# Patient Record
Sex: Male | Born: 1965 | Race: White | Hispanic: No | Marital: Single | Smoking: Current every day smoker
Health system: Southern US, Community
[De-identification: ages and names within clinical notes are randomized; demographics above are authoritative.]

## PROBLEM LIST (undated history)

## (undated) DIAGNOSIS — I714 Abdominal aortic aneurysm, without rupture, unspecified: Secondary | ICD-10-CM

## (undated) DIAGNOSIS — I499 Cardiac arrhythmia, unspecified: Secondary | ICD-10-CM

## (undated) DIAGNOSIS — I447 Left bundle-branch block, unspecified: Secondary | ICD-10-CM

## (undated) DIAGNOSIS — K859 Acute pancreatitis without necrosis or infection, unspecified: Secondary | ICD-10-CM

## (undated) DIAGNOSIS — K219 Gastro-esophageal reflux disease without esophagitis: Secondary | ICD-10-CM

## (undated) DIAGNOSIS — I10 Essential (primary) hypertension: Secondary | ICD-10-CM

## (undated) DIAGNOSIS — J45909 Unspecified asthma, uncomplicated: Secondary | ICD-10-CM

## (undated) HISTORY — PX: HAND SURGERY: SHX662

## (undated) HISTORY — DX: Abdominal aortic aneurysm, without rupture: I71.4

## (undated) HISTORY — DX: Abdominal aortic aneurysm, without rupture, unspecified: I71.40

---

## 2018-06-08 ENCOUNTER — Inpatient Hospital Stay (HOSPITAL_COMMUNITY)
Admission: EM | Admit: 2018-06-08 | Discharge: 2018-06-13 | DRG: 439 | Disposition: A | Discharge status: Home / Self Care | Payer: Self-pay | Attending: Internal Medicine | Admitting: Internal Medicine

## 2018-06-08 ENCOUNTER — Other Ambulatory Visit: Payer: Self-pay

## 2018-06-08 ENCOUNTER — Emergency Department (HOSPITAL_COMMUNITY): Payer: Self-pay

## 2018-06-08 ENCOUNTER — Encounter (HOSPITAL_COMMUNITY): Payer: Self-pay

## 2018-06-08 DIAGNOSIS — Z7289 Other problems related to lifestyle: Secondary | ICD-10-CM

## 2018-06-08 DIAGNOSIS — K859 Acute pancreatitis without necrosis or infection, unspecified: Principal | ICD-10-CM | POA: Diagnosis present

## 2018-06-08 DIAGNOSIS — K219 Gastro-esophageal reflux disease without esophagitis: Secondary | ICD-10-CM | POA: Diagnosis present

## 2018-06-08 DIAGNOSIS — K2980 Duodenitis without bleeding: Secondary | ICD-10-CM | POA: Diagnosis present

## 2018-06-08 DIAGNOSIS — D72829 Elevated white blood cell count, unspecified: Secondary | ICD-10-CM

## 2018-06-08 DIAGNOSIS — Z9114 Patient's other noncompliance with medication regimen: Secondary | ICD-10-CM

## 2018-06-08 DIAGNOSIS — R739 Hyperglycemia, unspecified: Secondary | ICD-10-CM | POA: Diagnosis present

## 2018-06-08 DIAGNOSIS — K861 Other chronic pancreatitis: Secondary | ICD-10-CM | POA: Diagnosis present

## 2018-06-08 DIAGNOSIS — E162 Hypoglycemia, unspecified: Secondary | ICD-10-CM | POA: Diagnosis not present

## 2018-06-08 DIAGNOSIS — F1090 Alcohol use, unspecified, uncomplicated: Secondary | ICD-10-CM

## 2018-06-08 DIAGNOSIS — I714 Abdominal aortic aneurysm, without rupture, unspecified: Secondary | ICD-10-CM | POA: Diagnosis present

## 2018-06-08 DIAGNOSIS — E860 Dehydration: Secondary | ICD-10-CM | POA: Diagnosis present

## 2018-06-08 DIAGNOSIS — K863 Pseudocyst of pancreas: Secondary | ICD-10-CM | POA: Diagnosis present

## 2018-06-08 DIAGNOSIS — Z789 Other specified health status: Secondary | ICD-10-CM

## 2018-06-08 DIAGNOSIS — F1010 Alcohol abuse, uncomplicated: Secondary | ICD-10-CM | POA: Diagnosis present

## 2018-06-08 DIAGNOSIS — I10 Essential (primary) hypertension: Secondary | ICD-10-CM | POA: Diagnosis present

## 2018-06-08 DIAGNOSIS — E876 Hypokalemia: Secondary | ICD-10-CM | POA: Diagnosis present

## 2018-06-08 HISTORY — DX: Acute pancreatitis without necrosis or infection, unspecified: K85.90

## 2018-06-08 HISTORY — DX: Essential (primary) hypertension: I10

## 2018-06-08 LAB — C-REACTIVE PROTEIN: CRP: 6 mg/dL — ABNORMAL HIGH (ref <1.0)

## 2018-06-08 LAB — LIPASE, BLOOD: Lipase: 120 U/L — ABNORMAL HIGH (ref 11–51)

## 2018-06-08 LAB — COMPREHENSIVE METABOLIC PANEL
ALT: 18 U/L (ref 0–44)
AST: 29 U/L (ref 15–41)
Albumin: 4.8 g/dL (ref 3.5–5.0)
Alkaline Phosphatase: 108 U/L (ref 38–126)
Anion gap: 22 — ABNORMAL HIGH (ref 5–15)
BUN: 22 mg/dL — ABNORMAL HIGH (ref 6–20)
CO2: 38 mmol/L — ABNORMAL HIGH (ref 22–32)
Calcium: 10.5 mg/dL — ABNORMAL HIGH (ref 8.9–10.3)
Chloride: 82 mmol/L — ABNORMAL LOW (ref 98–111)
Creatinine, Ser: 1.18 mg/dL (ref 0.61–1.24)
GFR calc Af Amer: >60 mL/min (ref >60)
GFR calc non Af Amer: >60 mL/min (ref >60)
Glucose, Bld: 154 mg/dL — ABNORMAL HIGH (ref 70–99)
Potassium: 3 mmol/L — ABNORMAL LOW (ref 3.5–5.1)
Sodium: 142 mmol/L (ref 135–145)
Total Bilirubin: 0.8 mg/dL (ref 0.3–1.2)
Total Protein: 9 g/dL — ABNORMAL HIGH (ref 6.5–8.1)

## 2018-06-08 LAB — CBC
HCT: 54.8 % — ABNORMAL HIGH (ref 39.0–52.0)
Hemoglobin: 17.8 g/dL — ABNORMAL HIGH (ref 13.0–17.0)
MCH: 31.7 pg (ref 26.0–34.0)
MCHC: 32.5 g/dL (ref 30.0–36.0)
MCV: 97.5 fL (ref 80.0–100.0)
Platelets: 325 10*3/uL (ref 150–400)
RBC: 5.62 MIL/uL (ref 4.22–5.81)
RDW: 12.8 % (ref 11.5–15.5)
WBC: 23.4 10*3/uL — ABNORMAL HIGH (ref 4.0–10.5)
nRBC: 0 % (ref 0.0–0.2)

## 2018-06-08 LAB — MAGNESIUM: Magnesium: 2.2 mg/dL (ref 1.7–2.4)

## 2018-06-08 MED ORDER — LORAZEPAM 2 MG/ML IJ SOLN: 1.0000 mg | Freq: Four times a day (QID) | INTRAMUSCULAR | Status: AC | PRN

## 2018-06-08 MED ORDER — PANTOPRAZOLE SODIUM 40 MG IV SOLR
40.0000 mg | INTRAVENOUS | Status: DC
  Administered 2018-06-09 – 2018-06-10 (×2): 40 mg via INTRAVENOUS
  Filled 2018-06-08 (×2): qty 40

## 2018-06-08 MED ORDER — ACETAMINOPHEN 650 MG RE SUPP: 650.0000 mg | Freq: Four times a day (QID) | RECTAL | Status: DC | PRN

## 2018-06-08 MED ORDER — LORAZEPAM 1 MG PO TABS
1.0000 mg | ORAL_TABLET | Freq: Four times a day (QID) | ORAL | Status: AC | PRN
  Administered 2018-06-09: 1 mg via ORAL
  Filled 2018-06-08: qty 1

## 2018-06-08 MED ORDER — MORPHINE SULFATE (PF) 4 MG/ML IV SOLN
4.0000 mg | Freq: Once | INTRAVENOUS | Status: AC
  Administered 2018-06-08: 4 mg via INTRAVENOUS
  Filled 2018-06-08: qty 1

## 2018-06-08 MED ORDER — SODIUM CHLORIDE (PF) 0.9 % IJ SOLN
INTRAMUSCULAR | Status: AC
  Filled 2018-06-08: qty 50

## 2018-06-08 MED ORDER — IOPAMIDOL (ISOVUE-300) INJECTION 61%
100.0000 mL | Freq: Once | INTRAVENOUS | Status: AC | PRN
  Administered 2018-06-08: 100 mL via INTRAVENOUS

## 2018-06-08 MED ORDER — SODIUM CHLORIDE 0.9 % IV BOLUS
1000.0000 mL | Freq: Once | INTRAVENOUS | Status: AC
  Administered 2018-06-08: 1000 mL via INTRAVENOUS

## 2018-06-08 MED ORDER — IOPAMIDOL (ISOVUE-300) INJECTION 61%
INTRAVENOUS | Status: AC
  Filled 2018-06-08: qty 100

## 2018-06-08 MED ORDER — FOLIC ACID 1 MG PO TABS
1.0000 mg | ORAL_TABLET | Freq: Every day | ORAL | Status: DC
  Administered 2018-06-10 – 2018-06-13 (×4): 1 mg via ORAL
  Filled 2018-06-08 (×4): qty 1

## 2018-06-08 MED ORDER — ONDANSETRON HCL 4 MG/2ML IJ SOLN
4.0000 mg | Freq: Four times a day (QID) | INTRAMUSCULAR | Status: DC | PRN
  Administered 2018-06-11 (×2): 4 mg via INTRAVENOUS
  Filled 2018-06-08 (×2): qty 2

## 2018-06-08 MED ORDER — LACTATED RINGERS IV SOLN
INTRAVENOUS | Status: DC
  Administered 2018-06-08 – 2018-06-09 (×2): via INTRAVENOUS

## 2018-06-08 MED ORDER — THIAMINE HCL 100 MG/ML IJ SOLN
100.0000 mg | Freq: Every day | INTRAMUSCULAR | Status: DC
  Administered 2018-06-09: 100 mg via INTRAVENOUS
  Filled 2018-06-08: qty 2

## 2018-06-08 MED ORDER — PANTOPRAZOLE SODIUM 40 MG IV SOLR
40.0000 mg | Freq: Once | INTRAVENOUS | Status: AC
  Administered 2018-06-08: 40 mg via INTRAVENOUS
  Filled 2018-06-08: qty 40

## 2018-06-08 MED ORDER — MORPHINE SULFATE (PF) 2 MG/ML IV SOLN
2.0000 mg | INTRAVENOUS | Status: DC | PRN
  Administered 2018-06-09 – 2018-06-12 (×17): 2 mg via INTRAVENOUS
  Filled 2018-06-08 (×18): qty 1

## 2018-06-08 MED ORDER — POTASSIUM CHLORIDE 10 MEQ/100ML IV SOLN
10.0000 meq | INTRAVENOUS | Status: AC
  Administered 2018-06-08 (×3): 10 meq via INTRAVENOUS
  Filled 2018-06-08 (×2): qty 100

## 2018-06-08 MED ORDER — ONDANSETRON HCL 4 MG/2ML IJ SOLN
4.0000 mg | Freq: Once | INTRAMUSCULAR | Status: AC
  Administered 2018-06-08: 4 mg via INTRAVENOUS
  Filled 2018-06-08: qty 2

## 2018-06-08 MED ORDER — HYDROMORPHONE HCL 1 MG/ML IJ SOLN
1.0000 mg | Freq: Once | INTRAMUSCULAR | Status: AC
  Administered 2018-06-08: 1 mg via INTRAVENOUS
  Filled 2018-06-08: qty 1

## 2018-06-08 MED ORDER — ACETAMINOPHEN 325 MG PO TABS: 650.0000 mg | ORAL_TABLET | Freq: Four times a day (QID) | ORAL | Status: DC | PRN

## 2018-06-08 MED ORDER — HYDRALAZINE HCL 20 MG/ML IJ SOLN: 5.0000 mg | INTRAMUSCULAR | Status: DC | PRN

## 2018-06-08 MED ORDER — VITAMIN B-1 100 MG PO TABS
100.0000 mg | ORAL_TABLET | Freq: Every day | ORAL | Status: DC
  Administered 2018-06-10 – 2018-06-13 (×4): 100 mg via ORAL
  Filled 2018-06-08 (×4): qty 1

## 2018-06-08 MED ORDER — ADULT MULTIVITAMIN W/MINERALS CH
1.0000 | ORAL_TABLET | Freq: Every day | ORAL | Status: DC
  Administered 2018-06-10 – 2018-06-13 (×4): 1 via ORAL
  Filled 2018-06-08 (×4): qty 1

## 2018-06-08 MED ORDER — ONDANSETRON HCL 4 MG PO TABS
4.0000 mg | ORAL_TABLET | Freq: Four times a day (QID) | ORAL | Status: DC | PRN
  Administered 2018-06-10: 4 mg via ORAL
  Filled 2018-06-08: qty 1
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
## 2018-06-08 NOTE — H&P (Signed)
History and Physical    Justin Poole Justin Poole DOB: 1965-12-12 DOA: 06/08/2018  PCP: Justin Poole, No Pcp Per  Justin Poole coming from: Justin Poole is homeless.  Chief Complaint: Abdominal pain.  HPI: Justin Poole is a 53 y.o. male with history of alcoholism tobacco abuse hypertension GERD presents to the ER with complaints of abdominal pain.  Justin Poole states Justin Poole has been abdominal pain for last 2 days and epigastrium with no radiation or any vomiting or diarrhea.  Pain has been persistent.  Justin Poole states he was admitted at [facility] 3 months ago and at the time was first time diagnosed with pancreatitis.  Since then he has been trying to cut down his alcohol and at this time drinks only 1 or 2 drinks a week.  Last drink was 3 days ago following which his pancreatitis started acting up again.  Denies chest pain shortness of breath.  Denies any hematemesis.  Has not taken his medications for many weeks now.  ED Course: In the ER Justin Poole's labs show hematocrit of 54 hemoglobin of 17 lipase of 120 with hypercalcemia and mild hypokalemia.  CT scan of the abdomen pelvis done shows features concerning for pancreatitis with chronic pseudocyst and there is also concern for duodenitis.  In addition shown his infrarenal abdominal aortic aneurysm slightly enlarged from previous.  Justin Poole admitted for acute pancreatitis IV fluids started along with pain medication.  Review of Systems: As per HPI, rest all negative.   Past Medical History:  Diagnosis Date  . Pancreatitis   . Pancreatitis     History reviewed. No pertinent surgical history.   reports that he has never smoked. He has never used smokeless tobacco. He reports current alcohol use. No history on file for drug.  No Known Allergies  History reviewed. No pertinent family history.  Prior to Admission medications   Not on File    Physical Exam: Vitals:   06/08/18 1809 06/08/18 1830 06/08/18 1900 06/08/18 2000  BP:  (!) 144/90  139/89 (!) 143/81  Pulse:  (!) 109 (!) 109 93  Resp:   18 16  SpO2: 97% 94% 95% 94%      Constitutional: Moderately built and nourished. Vitals:   06/08/18 1809 06/08/18 1830 06/08/18 1900 06/08/18 2000  BP:  (!) 144/90 139/89 (!) 143/81  Pulse:  (!) 109 (!) 109 93  Resp:   18 16  SpO2: 97% 94% 95% 94%   Eyes: Anicteric no pallor. ENMT: No discharge from the ears eyes nose or mouth. Neck: No mass felt.  No neck rigidity but no JVD appreciated. Respiratory: No rhonchi or crepitations. Cardiovascular: S1-S2 heard no murmurs appreciated. Abdomen: Soft mild epigastric tenderness no guarding or rigidity. Musculoskeletal: No edema.  No joint effusion. Skin: No rash. Neurologic: Alert awake oriented to time place and person.  Moves all extremities. Psychiatric: Appears normal.  Normal affect.   Labs on Admission: I have personally reviewed following labs and imaging studies  CBC: Recent Labs  Lab 06/08/18 1904  WBC 23.4*  HGB 17.8*  HCT 54.8*  MCV 97.5  PLT 325   Basic Metabolic Panel: Recent Labs  Lab 06/08/18 1904  NA 142  K 3.0*  CL 82*  CO2 38*  GLUCOSE 154*  BUN 22*  CREATININE 1.18  CALCIUM 10.5*   GFR: CrCl cannot be calculated (Unknown ideal weight.). Liver Function Tests: Recent Labs  Lab 06/08/18 1904  AST 29  ALT 18  ALKPHOS 108  BILITOT 0.8  PROT 9.0*  ALBUMIN 4.8   Recent Labs  Lab 06/08/18 1904  LIPASE 120*   No results for input(s): AMMONIA in the last 168 hours. Coagulation Profile: No results for input(s): INR, PROTIME in the last 168 hours. Cardiac Enzymes: No results for input(s): CKTOTAL, CKMB, CKMBINDEX, TROPONINI in the last 168 hours. BNP (last 3 results) No results for input(s): PROBNP in the last 8760 hours. HbA1C: No results for input(s): HGBA1C in the last 72 hours. CBG: No results for input(s): GLUCAP in the last 168 hours. Lipid Profile: No results for input(s): CHOL, HDL, LDLCALC, TRIG, CHOLHDL, LDLDIRECT in the  last 72 hours. Thyroid Function Tests: No results for input(s): TSH, T4TOTAL, FREET4, T3FREE, THYROIDAB in the last 72 hours. Anemia Panel: No results for input(s): VITAMINB12, FOLATE, FERRITIN, TIBC, IRON, RETICCTPCT in the last 72 hours. Urine analysis: No results found for: COLORURINE, APPEARANCEUR, LABSPEC, PHURINE, GLUCOSEU, HGBUR, BILIRUBINUR, KETONESUR, PROTEINUR, UROBILINOGEN, NITRITE, LEUKOCYTESUR Sepsis Labs: @LABRCNTIP (procalcitonin:4,lacticidven:4) )No results found for this or any previous visit (from the past 240 hour(s)).   Radiological Exams on Admission: Ct Abdomen Pelvis W Contrast  Result Date: 06/08/2018 CLINICAL DATA:  Abdominal pain with nausea and vomiting. EXAM: CT ABDOMEN AND PELVIS WITH CONTRAST TECHNIQUE: Multidetector CT imaging of the abdomen and pelvis was performed using the standard protocol following bolus administration of intravenous contrast. CONTRAST:  <See Chart> ISOVUE-300 IOPAMIDOL (ISOVUE-300) INJECTION 61% COMPARISON:  02/16/2018 FINDINGS: Lower chest: Lung bases essentially clear.  Heart normal in size. Hepatobiliary: Decreased attenuation of the liver consistent with fatty infiltration. Liver normal in size. No mass or focal lesion. Normal gallbladder. No bile duct dilation. Pancreas: Cystic mass in the pancreatic head measuring 2.4 x 1.6 x 2.7 cm. This is similar to the prior CT. No other pancreatic masses. Are inflammatory changes at centered adjacent to the first and second portions of the duodenum and in the right upper quadrant adjacent to the a pathic flexure of the colon. These contact the pancreatic head. No other peripancreatic inflammation. Spleen: Normal in size without focal abnormality. Adrenals/Urinary Tract: No adrenal masses. Kidneys normal in size, orientation and position. Tiny low-density lesion in the medial upper pole of the right kidney. Subcentimeter low-density lesion in the medial lower pole of the left kidney. These are likely cysts.  No other renal masses or lesions, no stones and no hydronephrosis. Ureters are normal in course and in caliber. Bladder is unremarkable. Stomach/Bowel: There is wall thickening of the distal esophagus measuring 6-7 mm. Stomach is unremarkable. There is irregular wall thickening of the duodenal bulb and second portion of the duodenum, predominantly the second portion of the duodenum mostly medially where it abuts the pancreatic head. There is surrounding inflammation. Small bowel is normal in caliber. No wall thickening or inflammation. The hepatic flexure of the colon is decompressed with adjacent inflammation. There is fat within the wall. Remainder of the colon is unremarkable. Normal appendix visualized. Vascular/Lymphatic: Abdominal aortic aneurysm. This is a few so formed infrarenal aneurysm, 7 cm in length, 4.8 cm anterior-posterior and 5 cm right to left. This has increased from 4.5 cm anterior-posterior on the prior study. Portal vein, superior mesenteric vein and splenic vein are widely patent. Prominent subcentimeter gastrohepatic ligament lymph nodes. No enlarged lymph nodes. Reproductive: Unremarkable. Other: A small amount of fluid tracks along the right anterior pararenal fascia. Musculoskeletal: No fracture or acute finding. No osteoblastic or osteolytic lesions. IMPRESSION: 1. There are inflammatory changes in the right upper quadrant. This may be from focal pancreatitis of pancreatic head,  which is supported by a cystic mass in pancreatic head, the latter finding stable from the prior CT, likely a chronic pseudocyst. However, the inflammation is more centered on the first and second portions of the duodenum, and also abuts the hepatic flexure of the colon. There is irregular wall thickening of the first and second portions of the duodenum. This may be reactive to pancreatitis or reflect duodenitis. No evidence of a perforated ulcer. 2. Wall thickening of the distal esophagus suggesting esophagitis.  3. No other acute findings in the abdomen or pelvis. 4. 4.8 cm infrarenal abdominal aortic aneurysm increased in size from 4.5 cm previously. Recommend followup by abdomen and pelvis CTA in 6 months, and vascular surgery referral/consultation if not already obtained. This recommendation follows ACR consensus guidelines: White Paper of the ACR Incidental Findings Committee II on Vascular Findings. J Am Coll Radiol 2013; 10:789-794. Aortic aneurysm NOS (ICD10-I71.9) 5. Hepatic steatosis. Electronically Signed   By: Amie Portland M.D.   On: 06/08/2018 20:31     Assessment/Plan Principal Problem:   Acute pancreatitis Active Problems:   Hypercalcemia   Hypokalemia    1. 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  Justin Poole also has significantly elevated hematocrit for which we will start with aggressive hydration keep Justin Poole n.p.o. follow intake output metabolic panel follow BUN/creatinine and hemoglobin hematocrit.  Justin Poole advised quitting alcohol.  There is also concern for duodenitis for which Justin Poole is on Protonix for now. 2. Significant leukocytosis -has no fever.  Does have pseudocyst but as per the CAT scan finding it has been stable since the last finding.  Justin Poole is afebrile.  Will check UA and chest x-ray.  Follow blood cultures.  No antibiotics at this time.  No definite signs of any necrotizing pancreatitis in the CAT scan. 3. Hypercalcemia likely from dehydration.  Continue hydration and I think calcium may get corrected.  Follow metabolic panel. 4. Hypokalemia likely from dehydration.  Replace recheck.  Check magnesium. 5. Hypertension has not been taking his medications for last few weeks.  Place Justin Poole on PRN IV hydralazine. 6. Infrarenal abdominal aortic aneurysm has increased in size from previous.  Will need repeat CAT scan in 6 months. 7. Mild hyperglycemia will  check hemoglobin A1c. 8. Alcohol and tobacco abuse -Place Justin Poole on CIWA.  Tobacco cessation counseling requested.   DVT prophylaxis: SCDs. Code Status: Full code. Family Communication: Discussed with Justin Poole. Disposition Plan: To be determined. Consults called: None. Admission status: Inpatient.   Eduard Clos MD [facility] Pager [phone].  If 7PM-7AM, please contact night-coverage [email or web address] Password [email or web address]  06/08/2018, 9:21 PM

## 2018-06-08 NOTE — ED Provider Notes (Signed)
[facility]-EMERGENCY DEPT Provider Note   CSN: [id number] Arrival date & time: 06/08/18  1802     History   Chief Complaint Chief Complaint  Patient presents with  . Abdominal Pain    HPI Justin Poole is a 53 y.o. male.  HPI Patient is a 53 year old male presents the emergency department with 2 days of worsening upper abdominal pain with associated nausea and vomiting.  Still occasionally drinks alcohol.  History of pancreatitis.  Reports severe upper abdominal pain.  Denies hematemesis.  No diarrhea.  No fevers or chills.  No chest pain.  Symptoms are moderate to severe in severity.  Feels weak and dehydrated.   Past Medical History:  Diagnosis Date  . Pancreatitis     There are no active problems to display for this patient.   History reviewed. No pertinent surgical history.      Home Medications    Prior to Admission medications   Not on File    Family History History reviewed. No pertinent family history.  Social History Social History   Tobacco Use  . Smoking status: Not on file  Substance Use Topics  . Alcohol use: Yes  . Drug use: Not on file     Allergies   Patient has no known allergies.   Review of Systems Review of Systems  All other systems reviewed and are negative.    Physical Exam Updated Vital Signs BP (!) 143/81   Pulse 93   Resp 16   SpO2 94%   Physical Exam Vitals signs and nursing note reviewed.  Constitutional:      Appearance: He is well-developed.  HENT:     Head: Normocephalic and atraumatic.  Neck:     Musculoskeletal: Normal range of motion.  Cardiovascular:     Rate and Rhythm: Normal rate and regular rhythm.     Heart sounds: Normal heart sounds.  Pulmonary:     Effort: Pulmonary effort is normal. No respiratory distress.     Breath sounds: Normal breath sounds.  Abdominal:     General: There is no distension.     Palpations: Abdomen is soft.     Tenderness: There is abdominal  tenderness in the epigastric area. There is no guarding or rebound.  Musculoskeletal: Normal range of motion.  Skin:    General: Skin is warm and dry.  Neurological:     Mental Status: He is alert and oriented to person, place, and time.  Psychiatric:        Judgment: Judgment normal.      ED Treatments / Results  Labs (all labs ordered are listed, but only abnormal results are displayed) Labs Reviewed  CBC - Abnormal; Notable for the following components:      Result Value   WBC 23.4 (*)    Hemoglobin 17.8 (*)    HCT 54.8 (*)    All other components within normal limits  COMPREHENSIVE METABOLIC PANEL - Abnormal; Notable for the following components:   Potassium 3.0 (*)    Chloride 82 (*)    CO2 38 (*)    Glucose, Bld 154 (*)    BUN 22 (*)    Calcium 10.5 (*)    Total Protein 9.0 (*)    Anion gap 22 (*)    All other components within normal limits  LIPASE, BLOOD - Abnormal; Notable for the following components:   Lipase 120 (*)    All other components within normal limits    EKG  None  Radiology Ct Abdomen Pelvis W Contrast  Result Date: 06/08/2018 CLINICAL DATA:  Abdominal pain with nausea and vomiting. EXAM: CT ABDOMEN AND PELVIS WITH CONTRAST TECHNIQUE: Multidetector CT imaging of the abdomen and pelvis was performed using the standard protocol following bolus administration of intravenous contrast. CONTRAST:  <See Chart> ISOVUE-300 IOPAMIDOL (ISOVUE-300) INJECTION 61% COMPARISON:  02/16/2018 FINDINGS: Lower chest: Lung bases essentially clear.  Heart normal in size. Hepatobiliary: Decreased attenuation of the liver consistent with fatty infiltration. Liver normal in size. No mass or focal lesion. Normal gallbladder. No bile duct dilation. Pancreas: Cystic mass in the pancreatic head measuring 2.4 x 1.6 x 2.7 cm. This is similar to the prior CT. No other pancreatic masses. Are inflammatory changes at centered adjacent to the first and second portions of the duodenum and  in the right upper quadrant adjacent to the a pathic flexure of the colon. These contact the pancreatic head. No other peripancreatic inflammation. Spleen: Normal in size without focal abnormality. Adrenals/Urinary Tract: No adrenal masses. Kidneys normal in size, orientation and position. Tiny low-density lesion in the medial upper pole of the right kidney. Subcentimeter low-density lesion in the medial lower pole of the left kidney. These are likely cysts. No other renal masses or lesions, no stones and no hydronephrosis. Ureters are normal in course and in caliber. Bladder is unremarkable. Stomach/Bowel: There is wall thickening of the distal esophagus measuring 6-7 mm. Stomach is unremarkable. There is irregular wall thickening of the duodenal bulb and second portion of the duodenum, predominantly the second portion of the duodenum mostly medially where it abuts the pancreatic head. There is surrounding inflammation. Small bowel is normal in caliber. No wall thickening or inflammation. The hepatic flexure of the colon is decompressed with adjacent inflammation. There is fat within the wall. Remainder of the colon is unremarkable. Normal appendix visualized. Vascular/Lymphatic: Abdominal aortic aneurysm. This is a few so formed infrarenal aneurysm, 7 cm in length, 4.8 cm anterior-posterior and 5 cm right to left. This has increased from 4.5 cm anterior-posterior on the prior study. Portal vein, superior mesenteric vein and splenic vein are widely patent. Prominent subcentimeter gastrohepatic ligament lymph nodes. No enlarged lymph nodes. Reproductive: Unremarkable. Other: A small amount of fluid tracks along the right anterior pararenal fascia. Musculoskeletal: No fracture or acute finding. No osteoblastic or osteolytic lesions. IMPRESSION: 1. There are inflammatory changes in the right upper quadrant. This may be from focal pancreatitis of pancreatic head, which is supported by a cystic mass in pancreatic head,  the latter finding stable from the prior CT, likely a chronic pseudocyst. However, the inflammation is more centered on the first and second portions of the duodenum, and also abuts the hepatic flexure of the colon. There is irregular wall thickening of the first and second portions of the duodenum. This may be reactive to pancreatitis or reflect duodenitis. No evidence of a perforated ulcer. 2. Wall thickening of the distal esophagus suggesting esophagitis. 3. No other acute findings in the abdomen or pelvis. 4. 4.8 cm infrarenal abdominal aortic aneurysm increased in size from 4.5 cm previously. Recommend followup by abdomen and pelvis CTA in 6 months, and vascular surgery referral/consultation if not already obtained. This recommendation follows ACR consensus guidelines: White Paper of the ACR Incidental Findings Committee II on Vascular Findings. J Am Coll Radiol 2013; 10:789-794. Aortic aneurysm NOS (ICD10-I71.9) 5. Hepatic steatosis. Electronically Signed   By: Amie Portland M.D.   On: 06/08/2018 20:31    Procedures Procedures (including critical  care time)  Medications Ordered in ED Medications  sodium chloride (PF) 0.9 % injection (has no administration in time range)  sodium chloride 0.9 % bolus 1,000 mL (has no administration in time range)  potassium chloride 10 mEq in 100 mL IVPB (has no administration in time range)  ondansetron (ZOFRAN) injection 4 mg (4 mg Intravenous Given 06/08/18 1855)  HYDROmorphone (DILAUDID) injection 1 mg (1 mg Intravenous Given 06/08/18 1856)  pantoprazole (PROTONIX) injection 40 mg (40 mg Intravenous Given 06/08/18 1858)  sodium chloride 0.9 % bolus 1,000 mL (0 mLs Intravenous Stopped 06/08/18 1958)  iopamidol (ISOVUE-300) 61 % injection 100 mL (100 mLs Intravenous Contrast Given 06/08/18 2008)     Initial Impression / Assessment and Plan / ED Course  I have reviewed the triage vital signs and the nursing notes.  Pertinent labs & imaging results that were  available during my care of the patient were reviewed by me and considered in my medical decision making (see chart for details).     Pancreatitis/duodenitis.  IV Protonix given.  IV fluids given.  Will initiate IV potassium at this time.  Will admit for symptom and pain control as well as bowel rest.  Elevated white blood cell count noted.  Could have secondarily infected pseudocyst.  Will likely benefit from GI consultation in the a.m.  No additional emergent consultation required tonight.  Patient will be admitted to the hospital for symptom control.  Final Clinical Impressions(s) / ED Diagnoses   Final diagnoses:  Acute pancreatitis, unspecified complication status, unspecified pancreatitis type    ED Discharge Orders    None       Azalia Bilis, MD 06/08/18 2102

## 2018-06-08 NOTE — ED Notes (Signed)
Patient transported to CT 

## 2018-06-09 ENCOUNTER — Inpatient Hospital Stay (HOSPITAL_COMMUNITY): Payer: Self-pay

## 2018-06-09 DIAGNOSIS — I714 Abdominal aortic aneurysm, without rupture, unspecified: Secondary | ICD-10-CM | POA: Diagnosis present

## 2018-06-09 DIAGNOSIS — Z789 Other specified health status: Secondary | ICD-10-CM

## 2018-06-09 DIAGNOSIS — K219 Gastro-esophageal reflux disease without esophagitis: Secondary | ICD-10-CM | POA: Diagnosis present

## 2018-06-09 DIAGNOSIS — I10 Essential (primary) hypertension: Secondary | ICD-10-CM | POA: Diagnosis present

## 2018-06-09 DIAGNOSIS — Z7289 Other problems related to lifestyle: Secondary | ICD-10-CM

## 2018-06-09 LAB — CBC WITH DIFFERENTIAL/PLATELET
Abs Immature Granulocytes: 0.09 10*3/uL — ABNORMAL HIGH (ref 0.00–0.07)
Basophils Absolute: 0 10*3/uL (ref 0.0–0.1)
Basophils Relative: 0 %
Eosinophils Absolute: 0 10*3/uL (ref 0.0–0.5)
Eosinophils Relative: 0 %
HCT: 41 % (ref 39.0–52.0)
Hemoglobin: 12.8 g/dL — ABNORMAL LOW (ref 13.0–17.0)
Immature Granulocytes: 1 %
Lymphocytes Relative: 11 %
Lymphs Abs: 2.1 10*3/uL (ref 0.7–4.0)
MCH: 30.6 pg (ref 26.0–34.0)
MCHC: 31.2 g/dL (ref 30.0–36.0)
MCV: 98.1 fL (ref 80.0–100.0)
Monocytes Absolute: 1.5 10*3/uL — ABNORMAL HIGH (ref 0.1–1.0)
Monocytes Relative: 8 %
Neutro Abs: 15.4 10*3/uL — ABNORMAL HIGH (ref 1.7–7.7)
Neutrophils Relative %: 80 %
Platelets: 218 10*3/uL (ref 150–400)
RBC: 4.18 MIL/uL — ABNORMAL LOW (ref 4.22–5.81)
RDW: 13.1 % (ref 11.5–15.5)
WBC: 19.1 10*3/uL — ABNORMAL HIGH (ref 4.0–10.5)
nRBC: 0 % (ref 0.0–0.2)

## 2018-06-09 LAB — HEMOGLOBIN A1C
Hgb A1c MFr Bld: 5.6 % (ref 4.8–5.6)
Mean Plasma Glucose: 114.02 mg/dL

## 2018-06-09 LAB — HEPATIC FUNCTION PANEL
ALT: 11 U/L (ref 0–44)
AST: 14 U/L — ABNORMAL LOW (ref 15–41)
Albumin: 3 g/dL — ABNORMAL LOW (ref 3.5–5.0)
Alkaline Phosphatase: 65 U/L (ref 38–126)
Bilirubin, Direct: 0.1 mg/dL (ref 0.0–0.2)
Indirect Bilirubin: 0.7 mg/dL (ref 0.3–0.9)
Total Bilirubin: 0.8 mg/dL (ref 0.3–1.2)
Total Protein: 5.5 g/dL — ABNORMAL LOW (ref 6.5–8.1)

## 2018-06-09 LAB — BASIC METABOLIC PANEL
Anion gap: 8 (ref 5–15)
BUN: 14 mg/dL (ref 6–20)
CO2: 34 mmol/L — ABNORMAL HIGH (ref 22–32)
Calcium: 8.5 mg/dL — ABNORMAL LOW (ref 8.9–10.3)
Chloride: 95 mmol/L — ABNORMAL LOW (ref 98–111)
Creatinine, Ser: 0.72 mg/dL (ref 0.61–1.24)
GFR calc Af Amer: >60 mL/min (ref >60)
GFR calc non Af Amer: >60 mL/min (ref >60)
Glucose, Bld: 88 mg/dL (ref 70–99)
Potassium: 3.1 mmol/L — ABNORMAL LOW (ref 3.5–5.1)
Sodium: 137 mmol/L (ref 135–145)

## 2018-06-09 LAB — HIV ANTIBODY (ROUTINE TESTING W REFLEX): HIV Screen 4th Generation wRfx: NONREACTIVE

## 2018-06-09 MED ORDER — POTASSIUM CHLORIDE 10 MEQ/100ML IV SOLN
10.0000 meq | INTRAVENOUS | Status: AC
  Administered 2018-06-09 (×4): 10 meq via INTRAVENOUS
  Filled 2018-06-09 (×3): qty 100

## 2018-06-09 MED ORDER — SODIUM CHLORIDE 0.9 % IV SOLN
INTRAVENOUS | Status: DC
  Administered 2018-06-09 (×2): via INTRAVENOUS

## 2018-06-09 NOTE — ED Notes (Signed)
ED TO INPATIENT HANDOFF REPORT  Name/Age/Gender Justin Poole 53 y.o. male  Code Status    Code Status Orders  (From admission, onward)         Start     Ordered   06/08/18 2119  Full code  Continuous     06/08/18 2120        Code Status History    This patient has a current code status but no historical code status.      Home/SNF/Other Home  Chief Complaint abdominal pains  Level of Care/Admitting Diagnosis ED Disposition    ED Disposition Condition Comment   Admit  Hospital Area: [facility] [[id number]]  Level of Care: Telemetry [5]  Admit to tele based on following criteria: Monitor for Ischemic changes  Diagnosis: Acute pancreatitis [577.0.ICD-9-CM]  Admitting Physician: Eduard Clos [[id number]]  Attending Physician: Eduard Clos [phone]  Estimated length of stay: past midnight tomorrow  Certification:: I certify this patient will need inpatient services for at least 2 midnights  PT Class (Do Not Modify): Inpatient [101]  PT Acc Code (Do Not Modify): Private [1]       Medical History Past Medical History:  Diagnosis Date  . Hypertension   . Pancreatitis   . Pancreatitis     Allergies No Known Allergies  IV Location/Drains/Wounds Patient Lines/Drains/Airways Status   Active Line/Drains/Airways    Name:   Placement date:   Placement time:   Site:   Days:   Peripheral IV 06/08/18 Left Antecubital   06/08/18    1858    Antecubital   1          Labs/Imaging Results for orders placed or performed during the hospital encounter of 06/08/18 (from the past 48 hour(s))  CBC     Status: Abnormal   Collection Time: 06/08/18  7:04 PM  Result Value Ref Range   WBC 23.4 (H) 4.0 - 10.5 K/uL   RBC 5.62 4.22 - 5.81 MIL/uL   Hemoglobin 17.8 (H) 13.0 - 17.0 g/dL   HCT 50.5 (H) 69.7 - 94.8 %   MCV 97.5 80.0 - 100.0 fL   MCH 31.7 26.0 - 34.0 pg   MCHC 32.5 30.0 - 36.0 g/dL   RDW 01.6 55.3 - 74.8 %   Platelets 325 150 - 400 K/uL    nRBC 0.0 0.0 - 0.2 %    Comment: Performed at [facility], [address] [place], [place] [address]  Comprehensive metabolic panel     Status: Abnormal   Collection Time: 06/08/18  7:04 PM  Result Value Ref Range   Sodium 142 135 - 145 mmol/L   Potassium 3.0 (L) 3.5 - 5.1 mmol/L   Chloride 82 (L) 98 - 111 mmol/L   CO2 38 (H) 22 - 32 mmol/L   Glucose, Bld 154 (H) 70 - 99 mg/dL   BUN 22 (H) 6 - 20 mg/dL   Creatinine, Ser 6.75 0.61 - 1.24 mg/dL   Calcium 44.9 (H) 8.9 - 10.3 mg/dL   Total Protein 9.0 (H) 6.5 - 8.1 g/dL   Albumin 4.8 3.5 - 5.0 g/dL   AST 29 15 - 41 U/L   ALT 18 0 - 44 U/L   Alkaline Phosphatase 108 38 - 126 U/L   Total Bilirubin 0.8 0.3 - 1.2 mg/dL   GFR calc non Af Amer >60 >60 mL/min   GFR calc Af Amer >60 >60 mL/min   Anion gap 22 (H) 5 - 15  Comment: REPEATED TO VERIFY Performed at [facility], [address] [place], [place] [address]   Lipase, blood     Status: Abnormal   Collection Time: 06/08/18  7:04 PM  Result Value Ref Range   Lipase 120 (H) 11 - 51 U/L    Comment: Performed at [facility], [address] [place], [place] [address]  C-reactive protein     Status: Abnormal   Collection Time: 06/08/18  7:04 PM  Result Value Ref Range   CRP 6.0 (H) <1.0 mg/dL    Comment: Performed at [facility], [address] [place], [place] [address]  Hemoglobin A1c     Status: None   Collection Time: 06/08/18  7:04 PM  Result Value Ref Range   Hgb A1c MFr Bld 5.6 4.8 - 5.6 %    Comment: (NOTE) Pre diabetes:          5.7%-6.4% Diabetes:              >6.4% Glycemic control for   <7.0% adults with diabetes    Mean Plasma Glucose 114.02 mg/dL    Comment: Performed at [facility], [address] [place], [place] [address]  Magnesium     Status: None   Collection Time: 06/08/18  7:04 PM  Result Value Ref Range   Magnesium 2.2 1.7 - 2.4 mg/dL    Comment: Performed at  [facility], [address] [place], [place] [address]   Ct Abdomen Pelvis W Contrast  Result Date: 06/08/2018 CLINICAL DATA:  Abdominal pain with nausea and vomiting. EXAM: CT ABDOMEN AND PELVIS WITH CONTRAST TECHNIQUE: Multidetector CT imaging of the abdomen and pelvis was performed using the standard protocol following bolus administration of intravenous contrast. CONTRAST:  <See Chart> ISOVUE-300 IOPAMIDOL (ISOVUE-300) INJECTION 61% COMPARISON:  02/16/2018 FINDINGS: Lower chest: Lung bases essentially clear.  Heart normal in size. Hepatobiliary: Decreased attenuation of the liver consistent with fatty infiltration. Liver normal in size. No mass or focal lesion. Normal gallbladder. No bile duct dilation. Pancreas: Cystic mass in the pancreatic head measuring 2.4 x 1.6 x 2.7 cm. This is similar to the prior CT. No other pancreatic masses. Are inflammatory changes at centered adjacent to the first and second portions of the duodenum and in the right upper quadrant adjacent to the a pathic flexure of the colon. These contact the pancreatic head. No other peripancreatic inflammation. Spleen: Normal in size without focal abnormality. Adrenals/Urinary Tract: No adrenal masses. Kidneys normal in size, orientation and position. Tiny low-density lesion in the medial upper pole of the right kidney. Subcentimeter low-density lesion in the medial lower pole of the left kidney. These are likely cysts. No other renal masses or lesions, no stones and no hydronephrosis. Ureters are normal in course and in caliber. Bladder is unremarkable. Stomach/Bowel: There is wall thickening of the distal esophagus measuring 6-7 mm. Stomach is unremarkable. There is irregular wall thickening of the duodenal bulb and second portion of the duodenum, predominantly the second portion of the duodenum mostly medially where it abuts the pancreatic head. There is surrounding inflammation. Small bowel is normal in caliber. No  wall thickening or inflammation. The hepatic flexure of the colon is decompressed with adjacent inflammation. There is fat within the wall. Remainder of the colon is unremarkable. Normal appendix visualized. Vascular/Lymphatic: Abdominal aortic aneurysm. This is a few so formed infrarenal aneurysm, 7 cm in length, 4.8 cm anterior-posterior and 5 cm right to left.  This has increased from 4.5 cm anterior-posterior on the prior study. Portal vein, superior mesenteric vein and splenic vein are widely patent. Prominent subcentimeter gastrohepatic ligament lymph nodes. No enlarged lymph nodes. Reproductive: Unremarkable. Other: A small amount of fluid tracks along the right anterior pararenal fascia. Musculoskeletal: No fracture or acute finding. No osteoblastic or osteolytic lesions. IMPRESSION: 1. There are inflammatory changes in the right upper quadrant. 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 However, the inflammation is more centered on the first and second portions of the duodenum, and also abuts the hepatic flexure of the colon. There is irregular wall thickening of the first and second portions of the duodenum. This may be reactive to pancreatitis or reflect duodenitis. No evidence of a perforated ulcer. 2. Wall thickening of the distal esophagus suggesting esophagitis. 3. No other acute findings in the abdomen or pelvis. 4. 4.8 cm infrarenal abdominal aortic aneurysm increased in size from 4.5 cm previously. Recommend followup by abdomen and pelvis CTA in 6 months, and vascular surgery referral/consultation if not already obtained. This recommendation follows ACR consensus guidelines: White Paper of the ACR Incidental Findings Committee II on Vascular Findings. J Am Coll Radiol 2013; 10:789-794. Aortic aneurysm NOS (ICD10-I71.9) 5. Hepatic steatosis. Electronically  Signed   By: Amie Portlandavid  Ormond M.D.   On: 06/08/2018 20:31   None  Pending Labs Unresulted Labs (From admission, onward)    Start     Ordered   06/09/18 0500  HIV antibody (Routine Testing)  Tomorrow morning,   R     06/08/18 2120   06/09/18 0500  Basic metabolic panel  Tomorrow morning,   R     06/08/18 2120   06/09/18 0500  Hepatic function panel  Tomorrow morning,   R     06/08/18 2120   06/09/18 0500  CBC WITH DIFFERENTIAL  Tomorrow morning,   R     06/08/18 2120   06/08/18 2121  Culture, blood (routine x 2)  BLOOD CULTURE X 2,   R     06/08/18 2120          Vitals/Pain Today's Vitals   06/09/18 0100 06/09/18 0101 06/09/18 0130 06/09/18 0200  BP: 115/73  (!) 127/97 138/90  Pulse: 72  99 74  Resp: 12  16 17   SpO2: 95%  97% 97%  PainSc:  Asleep      Isolation Precautions No active isolations  Medications Medications  sodium chloride (PF) 0.9 % injection (has no administration in time range)  acetaminophen (TYLENOL) tablet 650 mg (has no administration in time range)    Or  acetaminophen (TYLENOL) suppository 650 mg (has no administration in time range)  ondansetron (ZOFRAN) tablet 4 mg (has no administration in time range)    Or  ondansetron (ZOFRAN) injection 4 mg (has no administration in time range)  lactated ringers infusion ( Intravenous Rate/Dose Verify 06/09/18 0020)  pantoprazole (PROTONIX) injection 40 mg (has no administration in time range)  hydrALAZINE (APRESOLINE) injection 5 mg (has no administration in time range)  LORazepam (ATIVAN) tablet 1 mg (1 mg Oral Given 06/09/18 0001)    Or  LORazepam (ATIVAN) injection 1 mg ( Intravenous See Alternative 06/09/18 0001)  thiamine (VITAMIN B-1) tablet 100 mg (has no administration in time range)    Or  thiamine (B-1) injection 100 mg (has no administration in time range)  folic acid (FOLVITE) tablet 1 mg (has no administration in time range)  multivitamin with minerals tablet 1 tablet (has no administration in time  range)  morphine 2 MG/ML injection 2 mg (2 mg Intravenous Given 06/09/18 0001)  ondansetron (ZOFRAN) injection 4 mg (4 mg Intravenous Given 06/08/18 1855)  HYDROmorphone (DILAUDID) injection 1 mg (1 mg Intravenous Given 06/08/18 1856)  pantoprazole (PROTONIX) injection 40 mg (40 mg Intravenous Given 06/08/18 1858)  sodium chloride 0.9 % bolus 1,000 mL (0 mLs Intravenous Stopped 06/08/18 1958)  iopamidol (ISOVUE-300) 61 % injection 100 mL (100 mLs Intravenous Contrast Given 06/08/18 2008)  sodium chloride 0.9 % bolus 1,000 mL (0 mLs Intravenous Stopped 06/08/18 2155)  potassium chloride 10 mEq in 100 mL IVPB (0 mEq Intravenous Stopped 06/09/18 0023)  morphine 4 MG/ML injection 4 mg (4 mg Intravenous Given 06/08/18 2152)    Mobility walks

## 2018-06-09 NOTE — Progress Notes (Signed)
[facility]                                                                              Patient Demographics  Justin Poole, is a 53 y.o. male, DOB - 03/04/1966, [id number]  Admit date - 06/08/2018   Admitting Physician Eduard ClosArshad N Kakrakandy, MD  Outpatient Primary MD for the patient is Patient, No Pcp Per  Outpatient specialists:   LOS - 1  days   Medical records reviewed and are as summarized below:    Chief Complaint  Patient presents with  . Abdominal Pain       Brief summary   Patient is a 53 year old male with history of alcoholism, tobacco abuse, hypertension, GERD presented with abdominal pain.  Patient reported abdominal pain for the last 2 days and epigastric region, no radiation, vomiting or diarrhea, persistent.  Patient [facility] months ago when he was diagnosed with pancreatitis.  Since then he has been trying to cut down on his alcohol and drinks 1 or 2 drinks a week, last drink 3 days ago. In ED, hematocrit 54, hemoglobin 17, lipase 120 with hypercalcemia and mild hypokalemia CT showed pancreatitis with chronic pseudocyst and concern for duodenitis, infrarenal AAA slightly enlarged from previous.  Assessment & Plan    Principal Problem:   Acute pancreatitis with pseudocyst: Likely due to alcohol use -Continue n.p.o. status, IV fluid hydration, pain control -Currently not spiking any fevers, leukocytosis improving. -Abdominal pain 7/10, if spiking fevers or worsening status, will place on IV antibiotics and repeat imaging.  Follow blood cultures  Active Problems: Acute Duodenitis with history of GERD -Per CT imaging, continue IV PPI    Hypercalcemia -Likely due to dehydration, improving, continue IV fluid hydration    Hypokalemia -Potassium 3.1, replaced IV    Alcohol use -Patient counseled on alcohol cessation, continue CIWA with Ativan, thiamine, folate    Essential hypertension -BP currently stable   AAA (abdominal aortic aneurysm) (HCC) -CT showed 4.8 cm infrarenal AAA increased in size from 4.5 previously. -Recommended CTA abdomen pelvis in 6 months and vascular surgery referral outpatient  Code Status: Full CODE STATUS DVT Prophylaxis: SCD's Family Communication: Discussed in detail with the patient, all imaging results, lab results explained to the patient   Disposition Plan: Once patient is tolerating solids  Time Spent in minutes 35 minutes  Procedures:  CT abdomen  Consultants:   None  Antimicrobials:   Anti-infectives (From admission, onward)   None         Medications  Scheduled Meds: . folic acid  1 mg Oral Daily  . multivitamin with minerals  1 tablet Oral Daily  . pantoprazole (PROTONIX) IV  40 mg Intravenous Q24H  . thiamine  100 mg Oral Daily   Or  . thiamine  100 mg Intravenous Daily   Continuous Infusions: . sodium chloride 150 mL/hr at 06/09/18 0816  . potassium chloride 10 mEq (06/09/18 1020)   PRN Meds:.acetaminophen **OR** acetaminophen, hydrALAZINE, LORazepam **OR** LORazepam, morphine injection, ondansetron **OR** ondansetron (ZOFRAN) IV      Subjective:   Justin PhilipsDanny Poole was seen and examined today.  Still  in epigastric abdominal pain 7/10, no radiation, feeling dry, no fevers or chills.  No vomiting.  Patient denies dizziness, chest pain, shortness of breath, new weakness, numbess, tingling. No acute events overnight.    Objective:   Vitals:   06/09/18 0245 06/09/18 0309 06/09/18 0500 06/09/18 0906  BP: 109/85 (!) 144/97  126/77  Pulse: 84 90 78 86  Resp: 14 18  16   Temp:  97.6 F (36.4 C)    TempSrc:  Oral    SpO2: 99% 98%  94%    Intake/Output Summary (Last 24 hours) at 06/09/2018 1055 Last data filed at 06/09/2018 0300 Gross per 24 hour  Intake 1040.72 ml  Output -  Net 1040.72 ml     Wt Readings from Last 3 Encounters:  No data found for Wt     Exam  General: Alert and oriented x 3, NAD  Eyes:   HEENT:   Atraumatic, normocephalic  Cardiovascular: S1 S2 auscultated, Regular rate and rhythm.  Respiratory: Clear to auscultation bilaterally, no wheezing, rales or rhonchi  Gastrointestinal: Soft, epigastric TTP , nondistended, + bowel sounds  Ext: no pedal edema bilaterally  Neuro:   Musculoskeletal: No digital cyanosis, clubbing  Skin: No rashes  Psych: Normal affect and demeanor, alert and oriented x3    Data Reviewed:  I have personally reviewed following labs and imaging studies  Micro Results Recent Results (from the past 240 hour(s))  Culture, blood (routine x 2)     Status: None (Preliminary result)   Collection Time: 06/09/18 12:15 AM  Result Value Ref Range Status   Specimen Description BLOOD RIGHT HAND  Final   Special Requests   Final    BOTTLES DRAWN AEROBIC AND ANAEROBIC Blood Culture adequate volume Performed at [facility], [address] [place], [place] [address]    Culture NO GROWTH < 12 HOURS  Final   Report Status PENDING  Incomplete  Culture, blood (routine x 2)     Status: None (Preliminary result)   Collection Time: 06/09/18 12:16 AM  Result Value Ref Range Status   Specimen Description BLOOD RIGHT ANTECUBITAL  Final   Special Requests   Final    BOTTLES DRAWN AEROBIC AND ANAEROBIC Blood Culture results may not be optimal due to an excessive volume of blood received in culture bottles Performed at [facility], [address], [place], [place] [address]    Culture NO GROWTH < 12 HOURS  Final   Report Status PENDING  Incomplete    Radiology Reports Ct Abdomen Pelvis W Contrast  Result Date: 06/08/2018 CLINICAL DATA:  Abdominal pain with nausea and vomiting. EXAM: CT ABDOMEN AND PELVIS WITH CONTRAST TECHNIQUE: Multidetector CT imaging of the abdomen and pelvis was performed using the standard protocol following bolus administration of intravenous contrast. CONTRAST:  <See Chart> ISOVUE-300 IOPAMIDOL (ISOVUE-300)  INJECTION 61% COMPARISON:  02/16/2018 FINDINGS: Lower chest: Lung bases essentially clear.  Heart normal in size. Hepatobiliary: Decreased attenuation of the liver consistent with fatty infiltration. Liver normal in size. No mass or focal lesion. Normal gallbladder. No bile duct dilation. Pancreas: Cystic mass in the pancreatic head measuring 2.4 x 1.6 x 2.7 cm. This is similar to the prior CT. No other pancreatic masses. Are inflammatory changes at centered adjacent to the first and second portions of the duodenum and in the right upper quadrant adjacent to the a pathic flexure of the colon. These contact the pancreatic head. No other peripancreatic inflammation. Spleen: Normal in size without  focal abnormality. Adrenals/Urinary Tract: No adrenal masses. Kidneys normal in size, orientation and position. Tiny low-density lesion in the medial upper pole of the right kidney. Subcentimeter low-density lesion in the medial lower pole of the left kidney. These are likely cysts. No other renal masses or lesions, no stones and no hydronephrosis. Ureters are normal in course and in caliber. Bladder is unremarkable. Stomach/Bowel: There is wall thickening of the distal esophagus measuring 6-7 mm. Stomach is unremarkable. There is irregular wall thickening of the duodenal bulb and second portion of the duodenum, predominantly the second portion of the duodenum mostly medially where it abuts the pancreatic head. There is surrounding inflammation. Small bowel is normal in caliber. No wall thickening or inflammation. The hepatic flexure of the colon is decompressed with adjacent inflammation. There is fat within the wall. Remainder of the colon is unremarkable. Normal appendix visualized. Vascular/Lymphatic: Abdominal aortic aneurysm. This is a few so formed infrarenal aneurysm, 7 cm in length, 4.8 cm anterior-posterior and 5 cm right to left. This has increased from 4.5 cm anterior-posterior on the prior study. Portal vein,  superior mesenteric vein and splenic vein are widely patent. Prominent subcentimeter gastrohepatic ligament lymph nodes. No enlarged lymph nodes. Reproductive: Unremarkable. Other: A small amount of fluid tracks along the right anterior pararenal fascia. Musculoskeletal: No fracture or acute finding. No osteoblastic or osteolytic lesions. IMPRESSION: 1. There are inflammatory changes in the right upper quadrant. 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 However, the inflammation is more centered on the first and second portions of the duodenum, and also abuts the hepatic flexure of the colon. There is irregular wall thickening of the first and second portions of the duodenum. This may be reactive to pancreatitis or reflect duodenitis. No evidence of a perforated ulcer. 2. Wall thickening of the distal esophagus suggesting esophagitis. 3. No other acute findings in the abdomen or pelvis. 4. 4.8 cm infrarenal abdominal aortic aneurysm increased in size from 4.5 cm previously. Recommend followup by abdomen and pelvis CTA in 6 months, and vascular surgery referral/consultation if not already obtained. This recommendation follows ACR consensus guidelines: White Paper of the ACR Incidental Findings Committee II on Vascular Findings. J Am Coll Radiol 2013; 10:789-794. Aortic aneurysm NOS (ICD10-I71.9) 5. Hepatic steatosis. Electronically Signed   By: Amie Portland M.D.   On: 06/08/2018 20:31   Dg Chest Port 1 View  Result Date: 06/09/2018 CLINICAL DATA:  Leukocytosis. EXAM: PORTABLE CHEST 1 VIEW COMPARISON:  Radiograph of June 05, 2015. CT scan of January 14, 2018. FINDINGS: The heart size and mediastinal contours are within normal limits. No pneumothorax is noted. Right lung is clear. Mild opacity is noted laterally in left lung base which may represent focal inflammation  or atelectasis. Possible minimal left pleural effusion may be present. The visualized skeletal structures are unremarkable. IMPRESSION: Mild opacity is noted laterally in left lung base which may represent focal inflammation or atelectasis. Possible minimal left pleural effusion. Electronically Signed   By: Lupita Raider, M.D.   On: 06/09/2018 07:12    Lab Data:  CBC: Recent Labs  Lab 06/08/18 1904 06/09/18 0405  WBC 23.4* 19.1*  NEUTROABS  --  15.4*  HGB 17.8* 12.8*  HCT 54.8* 41.0  MCV 97.5 98.1  PLT 325 218   Basic Metabolic Panel: Recent Labs  Lab 06/08/18 1904 06/09/18 0405  NA 142 137  K 3.0* 3.1*  CL 82* 95*  CO2 38* 34*  GLUCOSE 154* 88  BUN 22* 14  CREATININE 1.18 0.72  CALCIUM 10.5* 8.5*  MG 2.2  --    GFR: CrCl cannot be calculated (Unknown ideal weight.). Liver Function Tests: Recent Labs  Lab 06/08/18 1904 06/09/18 0405  AST 29 14*  ALT 18 11  ALKPHOS 108 65  BILITOT 0.8 0.8  PROT 9.0* 5.5*  ALBUMIN 4.8 3.0*   Recent Labs  Lab 06/08/18 1904  LIPASE 120*   No results for input(s): AMMONIA in the last 168 hours. Coagulation Profile: No results for input(s): INR, PROTIME in the last 168 hours. Cardiac Enzymes: No results for input(s): CKTOTAL, CKMB, CKMBINDEX, TROPONINI in the last 168 hours. BNP (last 3 results) No results for input(s): PROBNP in the last 8760 hours. HbA1C: Recent Labs    06/08/18 1904  HGBA1C 5.6   CBG: No results for input(s): GLUCAP in the last 168 hours. Lipid Profile: No results for input(s): CHOL, HDL, LDLCALC, TRIG, CHOLHDL, LDLDIRECT in the last 72 hours. Thyroid Function Tests: No results for input(s): TSH, T4TOTAL, FREET4, T3FREE, THYROIDAB in the last 72 hours. Anemia Panel: No results for input(s): VITAMINB12, FOLATE, FERRITIN, TIBC, IRON, RETICCTPCT in the last 72 hours. Urine analysis: No results found for: COLORURINE, APPEARANCEUR, LABSPEC, PHURINE, GLUCOSEU, HGBUR, BILIRUBINUR, KETONESUR, PROTEINUR,  UROBILINOGEN, NITRITE, LEUKOCYTESUR     M.D. [facility] 06/09/2018, 10:55 AM  Pager: [phone] Between 7am to 7pm - call Pager - [phone]  After 7pm go to [email or web address] - password [email or web address]  Call night coverage person covering after 7pm

## 2018-06-09 NOTE — ED Notes (Signed)
Pt tolerating sips of water and tolerated PO ativan.

## 2018-06-09 NOTE — ED Notes (Signed)
Pt continues to tolerate sips of water without vomiting or nausea.

## 2018-06-10 LAB — CBC
HCT: 40.8 % (ref 39.0–52.0)
Hemoglobin: 12.8 g/dL — ABNORMAL LOW (ref 13.0–17.0)
MCH: 31 pg (ref 26.0–34.0)
MCHC: 31.4 g/dL (ref 30.0–36.0)
MCV: 98.8 fL (ref 80.0–100.0)
Platelets: 194 10*3/uL (ref 150–400)
RBC: 4.13 MIL/uL — ABNORMAL LOW (ref 4.22–5.81)
RDW: 12.3 % (ref 11.5–15.5)
WBC: 16.1 10*3/uL — ABNORMAL HIGH (ref 4.0–10.5)
nRBC: 0 % (ref 0.0–0.2)

## 2018-06-10 LAB — BASIC METABOLIC PANEL
Anion gap: 13 (ref 5–15)
BUN: 9 mg/dL (ref 6–20)
CO2: 24 mmol/L (ref 22–32)
Calcium: 8.2 mg/dL — ABNORMAL LOW (ref 8.9–10.3)
Chloride: 97 mmol/L — ABNORMAL LOW (ref 98–111)
Creatinine, Ser: 0.58 mg/dL — ABNORMAL LOW (ref 0.61–1.24)
GFR calc Af Amer: >60 mL/min (ref >60)
GFR calc non Af Amer: >60 mL/min (ref >60)
Glucose, Bld: 55 mg/dL — ABNORMAL LOW (ref 70–99)
Potassium: 3.1 mmol/L — ABNORMAL LOW (ref 3.5–5.1)
Sodium: 134 mmol/L — ABNORMAL LOW (ref 135–145)

## 2018-06-10 LAB — LIPASE, BLOOD: Lipase: 29 U/L (ref 11–51)

## 2018-06-10 LAB — GLUCOSE, CAPILLARY
Glucose-Capillary: 203 mg/dL — ABNORMAL HIGH (ref 70–99)
Glucose-Capillary: 48 mg/dL — ABNORMAL LOW (ref 70–99)

## 2018-06-10 LAB — MAGNESIUM: Magnesium: 1.6 mg/dL — ABNORMAL LOW (ref 1.7–2.4)

## 2018-06-10 MED ORDER — PANTOPRAZOLE SODIUM 40 MG PO TBEC
40.0000 mg | DELAYED_RELEASE_TABLET | Freq: Every day | ORAL | Status: DC
  Administered 2018-06-11 – 2018-06-13 (×3): 40 mg via ORAL
  Filled 2018-06-10 (×3): qty 1

## 2018-06-10 MED ORDER — POTASSIUM CHLORIDE CRYS ER 20 MEQ PO TBCR
40.0000 meq | EXTENDED_RELEASE_TABLET | Freq: Once | ORAL | Status: AC
  Administered 2018-06-10: 40 meq via ORAL
  Filled 2018-06-10: qty 2

## 2018-06-10 MED ORDER — MAGNESIUM OXIDE 400 (241.3 MG) MG PO TABS
400.0000 mg | ORAL_TABLET | Freq: Two times a day (BID) | ORAL | Status: DC
  Administered 2018-06-10 – 2018-06-13 (×7): 400 mg via ORAL
  Filled 2018-06-10 (×7): qty 1

## 2018-06-10 MED ORDER — DEXTROSE-NACL 5-0.45 % IV SOLN
INTRAVENOUS | Status: DC
  Administered 2018-06-10 – 2018-06-13 (×7): via INTRAVENOUS

## 2018-06-10 MED ORDER — DEXTROSE 50 % IV SOLN
INTRAVENOUS | Status: AC
  Administered 2018-06-10: 06:00:00
  Filled 2018-06-10: qty 50

## 2018-06-10 NOTE — Progress Notes (Signed)
Hypoglycemic Event  CBG: 48  Treatment: D50 50 mL (25 gm)  Symptoms: None  Follow-up CBG: Time:0620 CBG Result:203 Possible Reasons for Event: Inadequate meal intake  Comments/MD notified:Bodenhimer    Judyann Munson

## 2018-06-10 NOTE — Progress Notes (Signed)
[facility]                                                                              Patient Demographics  Justin Poole, is a 53 y.o. male, DOB - [birth year], WUJ:[id number]  Admit date - 06/08/2018   Admitting Physician Eduard Clos, MD  Outpatient Primary MD for the patient is Patient, No Pcp Per  Outpatient specialists:   LOS - 2  days   Medical records reviewed and are as summarized below:    Chief Complaint  Patient presents with  . Abdominal Pain       Brief summary   Patient is a 53 year old male with history of alcoholism, tobacco abuse, hypertension, GERD presented with abdominal pain.  Patient reported abdominal pain for the last 2 days and epigastric region, no radiation, vomiting or diarrhea, persistent.  Patient was admitted at [facility] 3 months ago when he was diagnosed with pancreatitis.  Since then he has been trying to cut down on his alcohol and drinks 1 or 2 drinks a week, last drink 3 days ago. In ED, hematocrit 54, hemoglobin 17, lipase 120 with hypercalcemia and mild hypokalemia CT showed pancreatitis with chronic pseudocyst and concern for duodenitis, infrarenal AAA slightly enlarged from previous.  Assessment & Plan    Principal Problem:   Acute pancreatitis with pseudocyst: Likely due to alcohol use - Currently not spiking any fevers, leukocytosis improving. -Feeling a lot better today, abdominal pains better, overnight had hypoglycemia  -Started on full liquid diet, patient had been tolerating clears without difficulty. -Advance to low-fat diet tonight -Lipase 29  Active Problems: Acute Duodenitis with history of GERD -Per CT imaging, transition to oral PPI     Hypercalcemia -Likely due to dehydration, improving, continue IV fluid hydration    Hypokalemia -Potassium 3.1, replaced orally  Hypomagnesemia -Replaced 400 mg BID.  Magnesium 1.6    Alcohol use -Patient counseled on alcohol cessation, continue  CIWA with Ativan, thiamine, folate -No withdrawals, patient very motivated to completely quit alcohol    Essential hypertension -BP currently stable    AAA (abdominal aortic aneurysm) (HCC) -CT showed 4.8 cm infrarenal AAA increased in size from 4.5 previously. -Recommended CTA abdomen pelvis in 6 months and vascular surgery referral outpatient  Code Status: Full CODE STATUS DVT Prophylaxis: SCD's Family Communication: Discussed in detail with the patient, all imaging results, lab results explained to the patient   Disposition Plan: DC home in a.m. if tolerating low-fat diet  Time Spent in minutes 25 minutes  Procedures:  CT abdomen  Consultants:   None  Antimicrobials:   Anti-infectives (From admission, onward)   None         Medications  Scheduled Meds: . folic acid  1 mg Oral Daily  . multivitamin with minerals  1 tablet Oral Daily  . pantoprazole (PROTONIX) IV  40 mg Intravenous Q24H  . thiamine  100 mg Oral Daily   Or  . thiamine  100 mg Intravenous Daily   Continuous Infusions: . dextrose 5 % and 0.45% NaCl 100 mL/hr at 06/10/18 0637   PRN Meds:.acetaminophen **OR** acetaminophen, hydrALAZINE, LORazepam **OR** LORazepam,  morphine injection, ondansetron **OR** ondansetron (ZOFRAN) IV      Subjective:   Justin Poole was seen and examined today.  Feels a lot better today, no fevers or chills, abdominal pain significantly better today.  Eager to start diet.  No nausea or vomiting.   Patient denies dizziness, chest pain, shortness of breath, new weakness, numbess, tingling. No acute events overnight.    Objective:   Vitals:   06/09/18 0906 06/09/18 1558 06/09/18 2102 06/10/18 0522  BP: 126/77 139/84 138/90 109/67  Pulse: 86 78 80 83  Resp: 16 18 20 18   Temp:  99.3 F (37.4 C) 98.3 F (36.8 C) 98.2 F (36.8 C)  TempSrc:  Oral    SpO2: 94% 100% 97% 94%    Intake/Output Summary (Last 24 hours) at 06/10/2018 1139 Last data filed at 06/10/2018  0853 Gross per 24 hour  Intake 957.48 ml  Output 2275 ml  Net -1317.52 ml     Wt Readings from Last 3 Encounters:  No data found for Wt    Physical Exam  General: Alert and oriented x 3, NAD  Eyes:   HEENT:    Cardiovascular: S1 S2 clear, RRR. No pedal edema b/l  Respiratory: CTAB, no wheezing, rales or rhonchi  Gastrointestinal: Soft, nontender, nondistended, NBS  Ext: no pedal edema bilaterally  Neuro: no new deficits  Musculoskeletal: No cyanosis, clubbing  Skin: No rashes  Psych: Normal affect and demeanor, alert and oriented x3    Data Reviewed:  I have personally reviewed following labs and imaging studies  Micro Results Recent Results (from the past 240 hour(s))  Culture, blood (routine x 2)     Status: None (Preliminary result)   Collection Time: 06/09/18 12:15 AM  Result Value Ref Range Status   Specimen Description BLOOD RIGHT HAND  Final   Special Requests   Final    BOTTLES DRAWN AEROBIC AND ANAEROBIC Blood Culture adequate volume Performed at [facility], [address], [place], [place] [address]    Culture NO GROWTH 1 DAY  Final   Report Status PENDING  Incomplete  Culture, blood (routine x 2)     Status: None (Preliminary result)   Collection Time: 06/09/18 12:16 AM  Result Value Ref Range Status   Specimen Description BLOOD RIGHT ANTECUBITAL  Final   Special Requests   Final    BOTTLES DRAWN AEROBIC AND ANAEROBIC Blood Culture results may not be optimal due to an excessive volume of blood received in culture bottles Performed at [facility], [address] [place], [place] [address]    Culture NO GROWTH 1 DAY  Final   Report Status PENDING  Incomplete    Radiology Reports Ct Abdomen Pelvis W Contrast  Result Date: 06/08/2018 CLINICAL DATA:  Abdominal pain with nausea and vomiting. EXAM: CT ABDOMEN AND PELVIS WITH CONTRAST TECHNIQUE: Multidetector CT imaging of the abdomen and pelvis was  performed using the standard protocol following bolus administration of intravenous contrast. CONTRAST:  <See Chart> ISOVUE-300 IOPAMIDOL (ISOVUE-300) INJECTION 61% COMPARISON:  02/16/2018 FINDINGS: Lower chest: Lung bases essentially clear.  Heart normal in size. Hepatobiliary: Decreased attenuation of the liver consistent with fatty infiltration. Liver normal in size. No mass or focal lesion. Normal gallbladder. No bile duct dilation. Pancreas: Cystic mass in the pancreatic head measuring 2.4 x 1.6 x 2.7 cm. This is similar to the prior CT. No other pancreatic masses. Are inflammatory changes at centered adjacent to the first and second portions of the  duodenum and in the right upper quadrant adjacent to the a pathic flexure of the colon. These contact the pancreatic head. No other peripancreatic inflammation. Spleen: Normal in size without focal abnormality. Adrenals/Urinary Tract: No adrenal masses. Kidneys normal in size, orientation and position. Tiny low-density lesion in the medial upper pole of the right kidney. Subcentimeter low-density lesion in the medial lower pole of the left kidney. These are likely cysts. No other renal masses or lesions, no stones and no hydronephrosis. Ureters are normal in course and in caliber. Bladder is unremarkable. Stomach/Bowel: There is wall thickening of the distal esophagus measuring 6-7 mm. Stomach is unremarkable. There is irregular wall thickening of the duodenal bulb and second portion of the duodenum, predominantly the second portion of the duodenum mostly medially where it abuts the pancreatic head. There is surrounding inflammation. Small bowel is normal in caliber. No wall thickening or inflammation. The hepatic flexure of the colon is decompressed with adjacent inflammation. There is fat within the wall. Remainder of the colon is unremarkable. Normal appendix visualized. Vascular/Lymphatic: Abdominal aortic aneurysm. This is a few so formed infrarenal aneurysm, 7  cm in length, 4.8 cm anterior-posterior and 5 cm right to left. This has increased from 4.5 cm anterior-posterior on the prior study. Portal vein, superior mesenteric vein and splenic vein are widely patent. Prominent subcentimeter gastrohepatic ligament lymph nodes. No enlarged lymph nodes. Reproductive: Unremarkable. Other: A small amount of fluid tracks along the right anterior pararenal fascia. Musculoskeletal: No fracture or acute finding. No osteoblastic or osteolytic lesions. IMPRESSION: 1. There are inflammatory changes in the right upper quadrant. 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 However, the inflammation is more centered on the first and second portions of the duodenum, and also abuts the hepatic flexure of the colon. There is irregular wall thickening of the first and second portions of the duodenum. This may be reactive to pancreatitis or reflect duodenitis. No evidence of a perforated ulcer. 2. Wall thickening of the distal esophagus suggesting esophagitis. 3. No other acute findings in the abdomen or pelvis. 4. 4.8 cm infrarenal abdominal aortic aneurysm increased in size from 4.5 cm previously. Recommend followup by abdomen and pelvis CTA in 6 months, and vascular surgery referral/consultation if not already obtained. This recommendation follows ACR consensus guidelines: White Paper of the ACR Incidental Findings Committee II on Vascular Findings. J Am Coll Radiol 2013; 10:789-794. Aortic aneurysm NOS (ICD10-I71.9) 5. Hepatic steatosis. Electronically Signed   By: Amie Portlandavid  Ormond M.D.   On: 06/08/2018 20:31   Dg Chest Port 1 View  Result Date: 06/09/2018 CLINICAL DATA:  Leukocytosis. EXAM: PORTABLE CHEST 1 VIEW COMPARISON:  Radiograph of June 05, 2015. CT scan of January 14, 2018. FINDINGS: The heart size and mediastinal contours are within  normal limits. No pneumothorax is noted. Right lung is clear. Mild opacity is noted laterally in left lung base which may represent focal inflammation or atelectasis. Possible minimal left pleural effusion may be present. The visualized skeletal structures are unremarkable. IMPRESSION: Mild opacity is noted laterally in left lung base which may represent focal inflammation or atelectasis. Possible minimal left pleural effusion. Electronically Signed   By: Lupita RaiderJames  Green Jr, M.D.   On: 06/09/2018 07:12    Lab Data:  CBC: Recent Labs  Lab 06/08/18 1904 06/09/18 0405 06/10/18 0440  WBC 23.4* 19.1* 16.1*  NEUTROABS  --  15.4*  --   HGB 17.8* 12.8* 12.8*  HCT 54.8* 41.0 40.8  MCV 97.5 98.1 98.8  PLT 325 218 194   Basic Metabolic Panel: Recent Labs  Lab 06/08/18 1904 06/09/18 0405 06/10/18 0440  NA 142 137 134*  K 3.0* 3.1* 3.1*  CL 82* 95* 97*  CO2 38* 34* 24  GLUCOSE 154* 88 55*  BUN 22* 14 9  CREATININE 1.18 0.72 0.58*  CALCIUM 10.5* 8.5* 8.2*  MG 2.2  --  1.6*   GFR: CrCl cannot be calculated (Unknown ideal weight.). Liver Function Tests: Recent Labs  Lab 06/08/18 1904 06/09/18 0405  AST 29 14*  ALT 18 11  ALKPHOS 108 65  BILITOT 0.8 0.8  PROT 9.0* 5.5*  ALBUMIN 4.8 3.0*   Recent Labs  Lab 06/08/18 1904 06/10/18 0440  LIPASE 120* 29   No results for input(s): AMMONIA in the last 168 hours. Coagulation Profile: No results for input(s): INR, PROTIME in the last 168 hours. Cardiac Enzymes: No results for input(s): CKTOTAL, CKMB, CKMBINDEX, TROPONINI in the last 168 hours. BNP (last 3 results) No results for input(s): PROBNP in the last 8760 hours. HbA1C: Recent Labs    06/08/18 1904  HGBA1C 5.6   CBG: Recent Labs  Lab 06/10/18 0602 06/10/18 0622  GLUCAP 48* 203*   Lipid Profile: No results for input(s): CHOL, HDL, LDLCALC, TRIG, CHOLHDL, LDLDIRECT in the last 72 hours. Thyroid Function Tests: No results for input(s): TSH, T4TOTAL, FREET4, T3FREE,  THYROIDAB in the last 72 hours. Anemia Panel: No results for input(s): VITAMINB12, FOLATE, FERRITIN, TIBC, IRON, RETICCTPCT in the last 72 hours. Urine analysis: No results found for: COLORURINE, APPEARANCEUR, LABSPEC, PHURINE, GLUCOSEU, HGBUR, BILIRUBINUR, KETONESUR, PROTEINUR, UROBILINOGEN, NITRITE, LEUKOCYTESUR   Ripudeep Rai M.D. [facility] 06/10/2018, 11:39 AM  Pager: [phone] Between 7am to 7pm - call Pager - [phone]  After 7pm go to [email or web address] - password [email or web address]  Call night coverage person covering after 7pm

## 2018-06-10 NOTE — Progress Notes (Signed)
Diet advanced to heart healthy.  Patient states he only wanted broth today.  Stated he felt like he was "sick to his stomach."  Zofran given.  Patient ordered broth for evening meal.

## 2018-06-11 LAB — BASIC METABOLIC PANEL
Anion gap: 6 (ref 5–15)
BUN: <5 mg/dL — ABNORMAL LOW (ref 6–20)
CO2: 32 mmol/L (ref 22–32)
Calcium: 8.5 mg/dL — ABNORMAL LOW (ref 8.9–10.3)
Chloride: 97 mmol/L — ABNORMAL LOW (ref 98–111)
Creatinine, Ser: 0.6 mg/dL — ABNORMAL LOW (ref 0.61–1.24)
GFR calc Af Amer: >60 mL/min (ref >60)
GFR calc non Af Amer: >60 mL/min (ref >60)
Glucose, Bld: 127 mg/dL — ABNORMAL HIGH (ref 70–99)
Potassium: 3 mmol/L — ABNORMAL LOW (ref 3.5–5.1)
Sodium: 135 mmol/L (ref 135–145)

## 2018-06-11 LAB — CBC
HCT: 41.5 % (ref 39.0–52.0)
Hemoglobin: 13.4 g/dL (ref 13.0–17.0)
MCH: 31.5 pg (ref 26.0–34.0)
MCHC: 32.3 g/dL (ref 30.0–36.0)
MCV: 97.4 fL (ref 80.0–100.0)
Platelets: 208 10*3/uL (ref 150–400)
RBC: 4.26 MIL/uL (ref 4.22–5.81)
RDW: 12.2 % (ref 11.5–15.5)
WBC: 15.8 10*3/uL — ABNORMAL HIGH (ref 4.0–10.5)
nRBC: 0 % (ref 0.0–0.2)

## 2018-06-11 LAB — MAGNESIUM: Magnesium: 1.9 mg/dL (ref 1.7–2.4)

## 2018-06-11 MED ORDER — POTASSIUM CHLORIDE CRYS ER 20 MEQ PO TBCR
60.0000 meq | EXTENDED_RELEASE_TABLET | Freq: Once | ORAL | Status: AC
  Administered 2018-06-11: 60 meq via ORAL
  Filled 2018-06-11: qty 3

## 2018-06-11 NOTE — Progress Notes (Signed)
[facility]                                                                              Patient Demographics  Justin Poole, is a 53 y.o. male, DOB - 02-21-1966, TMA:[id number]  Admit date - 06/08/2018   Admitting Physician Eduard Clos, MD  Outpatient Primary MD for the patient is Patient, No Pcp Per  Outpatient specialists:   LOS - 3  days   Medical records reviewed and are as summarized below:    Chief Complaint  Patient presents with  . Abdominal Pain       Brief summary   Patient is a 53 year old male with history of alcoholism, tobacco abuse, hypertension, GERD presented with abdominal pain.  Patient reported abdominal pain for the last 2 days and epigastric region, no radiation, vomiting or diarrhea, persistent.  Patient was admitted at [facility] 3 months ago when he was diagnosed with pancreatitis.  Since then he has been trying to cut down on his alcohol and drinks 1 or 2 drinks a week, last drink 3 days ago. In ED, hematocrit 54, hemoglobin 17, lipase 120 with hypercalcemia and mild hypokalemia CT showed pancreatitis with chronic pseudocyst and concern for duodenitis, infrarenal AAA slightly enlarged from previous.  Assessment & Plan    Principal Problem:   Acute pancreatitis with pseudocyst: Likely due to alcohol use -Not able to tolerate solid diet, patient feels nauseated and abdominal pain since yesterday after diet was advanced -Continue pain control, IV fluids, will downgrade diet to full liquids  Active Problems: Acute Duodenitis with history of GERD -Per CT imaging, transition to oral PPI     Hypercalcemia -Due to dehydration and #1, continue IV fluids hydration    Hypokalemia -Potassium 3.0, replaced K-Dur 60 mEq x 1  Hypomagnesemia -Continue magnesium replacement    Alcohol use -Patient counseled on alcohol cessation, continue CIWA with Ativan, thiamine, folate -No withdrawals, patient very motivated to completely  quit alcohol    Essential hypertension -BP currently stable    AAA (abdominal aortic aneurysm) (HCC) -CT showed 4.8 cm infrarenal AAA increased in size from 4.5 previously. -Recommended CTA abdomen pelvis in 6 months and vascular surgery referral outpatient  Code Status: Full CODE STATUS DVT Prophylaxis: SCD's Family Communication: Discussed in detail with the patient, all imaging results, lab results explained to the patient   Disposition Plan: Downgrading diet to liquids as patient was not able to tolerate solid diet.  Will remain inpatient.  Time Spent in minutes 25 minutes  Procedures:  CT abdomen  Consultants:   None  Antimicrobials:   Anti-infectives (From admission, onward)   None         Medications  Scheduled Meds: . folic acid  1 mg Oral Daily  . magnesium oxide  400 mg Oral BID  . multivitamin with minerals  1 tablet Oral Daily  . pantoprazole  40 mg Oral Q0600  . thiamine  100 mg Oral Daily   Or  . thiamine  100 mg Intravenous Daily   Continuous Infusions: . dextrose 5 % and 0.45% NaCl 125 mL/hr at 06/11/18 1034   PRN Meds:.acetaminophen **  OR** acetaminophen, hydrALAZINE, LORazepam **OR** LORazepam, morphine injection, ondansetron **OR** ondansetron (ZOFRAN) IV      Subjective:   Justin Poole was seen and examined today.  Feeling nauseous and abdominal pain since starting solid diet.  No fevers.   Patient denies dizziness, chest pain, shortness of breath, new weakness, numbess, tingling.  Objective:   Vitals:   06/10/18 0522 06/10/18 1205 06/11/18 0626 06/11/18 1304  BP: 109/67 139/78 (!) 148/94 (!) 152/96  Pulse: 83 79 75 71  Resp: 18 20 18 18   Temp: 98.2 F (36.8 C) 98.1 F (36.7 C) 98 F (36.7 C) 97.9 F (36.6 C)  TempSrc:  Oral  Oral  SpO2: 94% 93% 92% 96%    Intake/Output Summary (Last 24 hours) at 06/11/2018 1427 Last data filed at 06/11/2018 1300 Gross per 24 hour  Intake 2038.33 ml  Output 1750 ml  Net 288.33 ml     Wt  Readings from Last 3 Encounters:  No data found for Wt   Physical Exam  General: Alert and oriented x 3, NAD  Eyes:  HEENT:   Cardiovascular: S1 S2 clear, no murmurs, RRR. No pedal edema b/l  Respiratory: CTA B  Gastrointestinal: Soft, mild epigastric tenderness to palpation, nondistended, NBS  Ext: no pedal edema bilaterally  Neuro: no new deficits  Musculoskeletal: No cyanosis, clubbing  Skin: No rashes  Psych: Normal affect and demeanor, alert and oriented x3   Data Reviewed:  I have personally reviewed following labs and imaging studies  Micro Results Recent Results (from the past 240 hour(s))  Culture, blood (routine x 2)     Status: None (Preliminary result)   Collection Time: 06/09/18 12:15 AM  Result Value Ref Range Status   Specimen Description   Final    BLOOD RIGHT HAND Performed at [facility], [address] [place], [place] [address]    Special Requests   Final    BOTTLES DRAWN AEROBIC AND ANAEROBIC Blood Culture adequate volume Performed at [facility], [address] [place], [place] [address]    Culture   Final    NO GROWTH 2 DAYS Performed at [facility], [address] [place], [place] [address]    Report Status PENDING  Incomplete  Culture, blood (routine x 2)     Status: None (Preliminary result)   Collection Time: 06/09/18 12:16 AM  Result Value Ref Range Status   Specimen Description   Final    BLOOD RIGHT ANTECUBITAL Performed at [facility], [address] [place], [place] [address]    Special Requests   Final    BOTTLES DRAWN AEROBIC AND ANAEROBIC Blood Culture results may not be optimal due to an excessive volume of blood received in culture bottles Performed at [facility], [address] [place], [place] [address]    Culture   Final    NO GROWTH 2 DAYS Performed at [facility], [address] [place], [place] [address]    Report  Status PENDING  Incomplete    Radiology Reports Ct Abdomen Pelvis W Contrast  Result Date: 06/08/2018 CLINICAL DATA:  Abdominal pain with nausea and vomiting. EXAM: CT ABDOMEN AND PELVIS WITH CONTRAST TECHNIQUE: Multidetector CT imaging of the abdomen and pelvis was performed using the standard protocol following bolus administration of intravenous contrast. CONTRAST:  <See Chart> ISOVUE-300 IOPAMIDOL (ISOVUE-300) INJECTION 61% COMPARISON:  02/16/2018 FINDINGS: Lower chest: Lung bases essentially clear.  Heart normal in size. Hepatobiliary:  Decreased attenuation of the liver consistent with fatty infiltration. Liver normal in size. No mass or focal lesion. Normal gallbladder. No bile duct dilation. Pancreas: Cystic mass in the pancreatic head measuring 2.4 x 1.6 x 2.7 cm. This is similar to the prior CT. No other pancreatic masses. Are inflammatory changes at centered adjacent to the first and second portions of the duodenum and in the right upper quadrant adjacent to the a pathic flexure of the colon. These contact the pancreatic head. No other peripancreatic inflammation. Spleen: Normal in size without focal abnormality. Adrenals/Urinary Tract: No adrenal masses. Kidneys normal in size, orientation and position. Tiny low-density lesion in the medial upper pole of the right kidney. Subcentimeter low-density lesion in the medial lower pole of the left kidney. These are likely cysts. No other renal masses or lesions, no stones and no hydronephrosis. Ureters are normal in course and in caliber. Bladder is unremarkable. Stomach/Bowel: There is wall thickening of the distal esophagus measuring 6-7 mm. Stomach is unremarkable. There is irregular wall thickening of the duodenal bulb and second portion of the duodenum, predominantly the second portion of the duodenum mostly medially where it abuts the pancreatic head. There is surrounding inflammation. Small bowel is normal in caliber. No wall thickening or  inflammation. The hepatic flexure of the colon is decompressed with adjacent inflammation. There is fat within the wall. Remainder of the colon is unremarkable. Normal appendix visualized. Vascular/Lymphatic: Abdominal aortic aneurysm. This is a few so formed infrarenal aneurysm, 7 cm in length, 4.8 cm anterior-posterior and 5 cm right to left. This has increased from 4.5 cm anterior-posterior on the prior study. Portal vein, superior mesenteric vein and splenic vein are widely patent. Prominent subcentimeter gastrohepatic ligament lymph nodes. No enlarged lymph nodes. Reproductive: Unremarkable. Other: A small amount of fluid tracks along the right anterior pararenal fascia. Musculoskeletal: No fracture or acute finding. No osteoblastic or osteolytic lesions. IMPRESSION: 1. There are inflammatory changes in the right upper quadrant. 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 However, the inflammation is more centered on the first and second portions of the duodenum, and also abuts the hepatic flexure of the colon. There is irregular wall thickening of the first and second portions of the duodenum. This may be reactive to pancreatitis or reflect duodenitis. No evidence of a perforated ulcer. 2. Wall thickening of the distal esophagus suggesting esophagitis. 3. No other acute findings in the abdomen or pelvis. 4. 4.8 cm infrarenal abdominal aortic aneurysm increased in size from 4.5 cm previously. Recommend followup by abdomen and pelvis CTA in 6 months, and vascular surgery referral/consultation if not already obtained. This recommendation follows ACR consensus guidelines: White Paper of the ACR Incidental Findings Committee II on Vascular Findings. J Am Coll Radiol 2013; 10:789-794. Aortic aneurysm NOS (ICD10-I71.9) 5. Hepatic steatosis. Electronically Signed   By: Amie Portland M.D.   On: 06/08/2018 20:31   Dg Chest Port 1 View  Result Date: 06/09/2018 CLINICAL DATA:  Leukocytosis. EXAM: PORTABLE CHEST 1 VIEW COMPARISON:  Radiograph of June 05, 2015. CT scan of January 14, 2018. FINDINGS: The heart size and mediastinal contours are within normal limits. No pneumothorax is noted. Right lung is clear. Mild opacity is noted laterally in left lung base which may represent focal inflammation or atelectasis. Possible minimal left pleural effusion may be present. The visualized skeletal structures are  unremarkable. IMPRESSION: Mild opacity is noted laterally in left lung base which may represent focal inflammation or atelectasis. Possible minimal left pleural effusion. Electronically Signed   By: Lupita RaiderJames  Green Jr, M.D.   On: 06/09/2018 07:12    Lab Data:  CBC: Recent Labs  Lab 06/08/18 1904 06/09/18 0405 06/10/18 0440 06/11/18 0450  WBC 23.4* 19.1* 16.1* 15.8*  NEUTROABS  --  15.4*  --   --   HGB 17.8* 12.8* 12.8* 13.4  HCT 54.8* 41.0 40.8 41.5  MCV 97.5 98.1 98.8 97.4  PLT 325 218 194 208   Basic Metabolic Panel: Recent Labs  Lab 06/08/18 1904 06/09/18 0405 06/10/18 0440 06/11/18 0450  NA 142 137 134* 135  K 3.0* 3.1* 3.1* 3.0*  CL 82* 95* 97* 97*  CO2 38* 34* 24 32  GLUCOSE 154* 88 55* 127*  BUN 22* 14 9 <5*  CREATININE 1.18 0.72 0.58* 0.60*  CALCIUM 10.5* 8.5* 8.2* 8.5*  MG 2.2  --  1.6* 1.9   GFR: CrCl cannot be calculated (Unknown ideal weight.). Liver Function Tests: Recent Labs  Lab 06/08/18 1904 06/09/18 0405  AST 29 14*  ALT 18 11  ALKPHOS 108 65  BILITOT 0.8 0.8  PROT 9.0* 5.5*  ALBUMIN 4.8 3.0*   Recent Labs  Lab 06/08/18 1904 06/10/18 0440  LIPASE 120* 29   No results for input(s): AMMONIA in the last 168 hours. Coagulation Profile: No results for input(s): INR, PROTIME in the last 168 hours. Cardiac Enzymes: No results for input(s): CKTOTAL, CKMB, CKMBINDEX, TROPONINI in the last 168 hours. BNP (last 3  results) No results for input(s): PROBNP in the last 8760 hours. HbA1C: Recent Labs    06/08/18 1904  HGBA1C 5.6   CBG: Recent Labs  Lab 06/10/18 0602 06/10/18 0622  GLUCAP 48* 203*   Lipid Profile: No results for input(s): CHOL, HDL, LDLCALC, TRIG, CHOLHDL, LDLDIRECT in the last 72 hours. Thyroid Function Tests: No results for input(s): TSH, T4TOTAL, FREET4, T3FREE, THYROIDAB in the last 72 hours. Anemia Panel: No results for input(s): VITAMINB12, FOLATE, FERRITIN, TIBC, IRON, RETICCTPCT in the last 72 hours. Urine analysis: No results found for: COLORURINE, APPEARANCEUR, LABSPEC, PHURINE, GLUCOSEU, HGBUR, BILIRUBINUR, KETONESUR, PROTEINUR, UROBILINOGEN, NITRITE, LEUKOCYTESUR   Ripudeep Rai M.D. [facility] 06/11/2018, 2:27 PM  Pager: [phone] Between 7am to 7pm - call Pager - [phone]  After 7pm go to [email or web address] - password [email or web address]  Call night coverage person covering after 7pm

## 2018-06-12 LAB — CBC
HCT: 40.4 % (ref 39.0–52.0)
Hemoglobin: 13 g/dL (ref 13.0–17.0)
MCH: 31.8 pg (ref 26.0–34.0)
MCHC: 32.2 g/dL (ref 30.0–36.0)
MCV: 98.8 fL (ref 80.0–100.0)
Platelets: 227 10*3/uL (ref 150–400)
RBC: 4.09 MIL/uL — ABNORMAL LOW (ref 4.22–5.81)
RDW: 12.3 % (ref 11.5–15.5)
WBC: 12.2 10*3/uL — ABNORMAL HIGH (ref 4.0–10.5)
nRBC: 0 % (ref 0.0–0.2)

## 2018-06-12 LAB — BASIC METABOLIC PANEL
Anion gap: 7 (ref 5–15)
BUN: <5 mg/dL — ABNORMAL LOW (ref 6–20)
CO2: 31 mmol/L (ref 22–32)
Calcium: 8.7 mg/dL — ABNORMAL LOW (ref 8.9–10.3)
Chloride: 98 mmol/L (ref 98–111)
Creatinine, Ser: 0.62 mg/dL (ref 0.61–1.24)
GFR calc Af Amer: >60 mL/min (ref >60)
GFR calc non Af Amer: >60 mL/min (ref >60)
Glucose, Bld: 112 mg/dL — ABNORMAL HIGH (ref 70–99)
Potassium: 3.1 mmol/L — ABNORMAL LOW (ref 3.5–5.1)
Sodium: 136 mmol/L (ref 135–145)

## 2018-06-12 MED ORDER — POTASSIUM CHLORIDE CRYS ER 20 MEQ PO TBCR
40.0000 meq | EXTENDED_RELEASE_TABLET | Freq: Once | ORAL | Status: AC
  Administered 2018-06-12: 40 meq via ORAL
  Filled 2018-06-12: qty 2

## 2018-06-12 MED ORDER — OXYCODONE HCL 5 MG PO TABS
5.0000 mg | ORAL_TABLET | Freq: Four times a day (QID) | ORAL | Status: DC | PRN
  Administered 2018-06-12 – 2018-06-13 (×2): 5 mg via ORAL
  Filled 2018-06-12 (×2): qty 1

## 2018-06-12 NOTE — Progress Notes (Signed)
[facility]                                                                              Patient Demographics  Justin Poole, is a 53 y.o. male, DOB - 07/10/1965, ZOX:[id number]  Admit date - 06/08/2018   Admitting Physician Eduard ClosArshad N Kakrakandy, MD  Outpatient Primary MD for the patient is Patient, No Pcp Per  Outpatient specialists:   LOS - 4  days   Medical records reviewed and are as summarized below:    Chief Complaint  Patient presents with  . Abdominal Pain       Brief summary   Patient is a 53 year old male with history of alcoholism, tobacco abuse, hypertension, GERD presented with abdominal pain.  Patient reported abdominal pain for the last 2 days and epigastric region, no radiation, vomiting or diarrhea, persistent.  Patient [facility] months ago when he was diagnosed with pancreatitis.  Since then he has been trying to cut down on his alcohol and drinks 1 or 2 drinks a week, last drink 3 days ago. In ED, hematocrit 54, hemoglobin 17, lipase 120 with hypercalcemia and mild hypokalemia CT showed pancreatitis with chronic pseudocyst and concern for duodenitis, infrarenal AAA slightly enlarged from previous.  Assessment & Plan    Principal Problem:   Acute pancreatitis with pseudocyst: Likely due to alcohol use -Felt better, pain controlled with the pain medications, wants to try soft solids today -Diet advanced to soft solids, if nausea, vomiting, abdominal pain returns, will have to downgrade back to full liquids -Continue IV fluids, pain control  Active Problems: Acute Duodenitis with history of GERD -Per CT imaging, transition to oral PPI     Hypercalcemia -Due to dehydration and #1, continue IV fluids hydration    Hypokalemia -Replaced  Hypomagnesemia -Continue magnesium replacement    Alcohol use -Patient counseled on alcohol cessation, continue CIWA with Ativan, thiamine, folate -No withdrawals, patient very  motivated to completely quit alcohol    Essential hypertension -BP currently stable    AAA (abdominal aortic aneurysm) (HCC) -CT showed 4.8 cm infrarenal AAA increased in size from 4.5 previously. -Recommended CTA abdomen pelvis in 6 months and vascular surgery referral outpatient  Code Status: Full CODE STATUS DVT Prophylaxis: SCD's Family Communication: Discussed in detail with the patient, all imaging results, lab results explained to the patient   Disposition Plan: If tolerating solids, can DC home in a.m. Time Spent in minutes 25 minutes  Procedures:  CT abdomen  Consultants:   None  Antimicrobials:   Anti-infectives (From admission, onward)   None         Medications  Scheduled Meds: . folic acid  1 mg Oral Daily  . magnesium oxide  400 mg Oral BID  . multivitamin with minerals  1 tablet Oral Daily  . pantoprazole  40 mg Oral Q0600  . thiamine  100 mg Oral Daily   Or  . thiamine  100 mg Intravenous Daily   Continuous Infusions: . dextrose 5 % and 0.45% NaCl 125 mL/hr at 06/12/18 1120   PRN Meds:.acetaminophen **OR** acetaminophen, hydrALAZINE, morphine injection, ondansetron **OR** ondansetron (  ZOFRAN) IV      Subjective:   Justin Poole was seen and examined today.  Per patient did well with full liquids last night, wants to try solids today.  No vomiting.  Abdominal pain better 5/10 in the epigastric region.   Patient denies dizziness, chest pain, shortness of breath, new weakness, numbess, tingling.  Objective:   Vitals:   06/11/18 1304 06/11/18 2053 06/12/18 0424 06/12/18 1010  BP: (!) 152/96 132/76 (!) 141/88 (!) 149/92  Pulse: 71 74 73 67  Resp: 18 18 18 17   Temp: 97.9 F (36.6 C) 97.8 F (36.6 C) 98.3 F (36.8 C) 98 F (36.7 C)  TempSrc: Oral Oral  Oral  SpO2: 96% 90% 93% 96%    Intake/Output Summary (Last 24 hours) at 06/12/2018 1305 Last data filed at 06/12/2018 1225 Gross per 24 hour  Intake 360 ml  Output 3725 ml  Net -3365 ml      Wt Readings from Last 3 Encounters:  No data found for Wt    Physical Exam  General: Alert and oriented x 3, NAD  Eyes:   HEENT:   Cardiovascular: S1 S2 clear, no murmurs, RRR. No pedal edema b/l  Respiratory: CTAB, no wheezing, rales or rhonchi  Gastrointestinal: Soft, mild epigastric tenderness, no rebound, nondistended, NBS  Ext: no pedal edema bilaterally  Neuro: no new deficits  Musculoskeletal: No cyanosis, clubbing  Skin: No rashes  Psych: Normal affect and demeanor, alert and oriented x3     Data Reviewed:  I have personally reviewed following labs and imaging studies  Micro Results Recent Results (from the past 240 hour(s))  Culture, blood (routine x 2)     Status: None (Preliminary result)   Collection Time: 06/09/18 12:15 AM  Result Value Ref Range Status   Specimen Description   Final    BLOOD RIGHT HAND Performed at [facility], [address], [place], [place] [address]    Special Requests   Final    BOTTLES DRAWN AEROBIC AND ANAEROBIC Blood Culture adequate volume Performed at [facility], [address] [place], [place] [address]    Culture   Final    NO GROWTH 3 DAYS Performed at [facility], [address], [place], [place] [address]    Report Status PENDING  Incomplete  Culture, blood (routine x 2)     Status: None (Preliminary result)   Collection Time: 06/09/18 12:16 AM  Result Value Ref Range Status   Specimen Description   Final    BLOOD RIGHT ANTECUBITAL Performed at [facility], [address], [place], [place] [address]    Special Requests   Final    BOTTLES DRAWN AEROBIC AND ANAEROBIC Blood Culture results may not be optimal due to an excessive volume of blood received in culture bottles Performed at [facility], [address], [place], [place] [address]    Culture   Final    NO GROWTH 3 DAYS Performed at [facility],  [address] [place], [place] [address]    Report Status PENDING  Incomplete    Radiology Reports Ct Abdomen Pelvis W Contrast  Result Date: 06/08/2018 CLINICAL DATA:  Abdominal pain with nausea and vomiting. EXAM: CT ABDOMEN AND PELVIS WITH CONTRAST TECHNIQUE: Multidetector CT imaging of the abdomen and pelvis was performed using the standard protocol following bolus administration of intravenous contrast. CONTRAST:  <See Chart> ISOVUE-300 IOPAMIDOL (ISOVUE-300) INJECTION 61% COMPARISON:  02/16/2018 FINDINGS:  Lower chest: Lung bases essentially clear.  Heart normal in size. Hepatobiliary: Decreased attenuation of the liver consistent with fatty infiltration. Liver normal in size. No mass or focal lesion. Normal gallbladder. No bile duct dilation. Pancreas: Cystic mass in the pancreatic head measuring 2.4 x 1.6 x 2.7 cm. This is similar to the prior CT. No other pancreatic masses. Are inflammatory changes at centered adjacent to the first and second portions of the duodenum and in the right upper quadrant adjacent to the a pathic flexure of the colon. These contact the pancreatic head. No other peripancreatic inflammation. Spleen: Normal in size without focal abnormality. Adrenals/Urinary Tract: No adrenal masses. Kidneys normal in size, orientation and position. Tiny low-density lesion in the medial upper pole of the right kidney. Subcentimeter low-density lesion in the medial lower pole of the left kidney. These are likely cysts. No other renal masses or lesions, no stones and no hydronephrosis. Ureters are normal in course and in caliber. Bladder is unremarkable. Stomach/Bowel: There is wall thickening of the distal esophagus measuring 6-7 mm. Stomach is unremarkable. There is irregular wall thickening of the duodenal bulb and second portion of the duodenum, predominantly the second portion of the duodenum mostly medially where it abuts the pancreatic head. There is surrounding inflammation. Small bowel is  normal in caliber. No wall thickening or inflammation. The hepatic flexure of the colon is decompressed with adjacent inflammation. There is fat within the wall. Remainder of the colon is unremarkable. Normal appendix visualized. Vascular/Lymphatic: Abdominal aortic aneurysm. This is a few so formed infrarenal aneurysm, 7 cm in length, 4.8 cm anterior-posterior and 5 cm right to left. This has increased from 4.5 cm anterior-posterior on the prior study. Portal vein, superior mesenteric vein and splenic vein are widely patent. Prominent subcentimeter gastrohepatic ligament lymph nodes. No enlarged lymph nodes. Reproductive: Unremarkable. Other: A small amount of fluid tracks along the right anterior pararenal fascia. Musculoskeletal: No fracture or acute finding. No osteoblastic or osteolytic lesions. IMPRESSION: 1. There are inflammatory changes in the right upper quadrant. 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 However, the inflammation is more centered on the first and second portions of the duodenum, and also abuts the hepatic flexure of the colon. There is irregular wall thickening of the first and second portions of the duodenum. This may be reactive to pancreatitis or reflect duodenitis. No evidence of a perforated ulcer. 2. Wall thickening of the distal esophagus suggesting esophagitis. 3. No other acute findings in the abdomen or pelvis. 4. 4.8 cm infrarenal abdominal aortic aneurysm increased in size from 4.5 cm previously. Recommend followup by abdomen and pelvis CTA in 6 months, and vascular surgery referral/consultation if not already obtained. This recommendation follows ACR consensus guidelines: White Paper of the ACR Incidental Findings Committee II on Vascular Findings. J Am Coll Radiol 2013; 10:789-794. Aortic aneurysm NOS (ICD10-I71.9) 5. Hepatic  steatosis. Electronically Signed   By: Amie Portland M.D.   On: 06/08/2018 20:31   Dg Chest Port 1 View  Result Date: 06/09/2018 CLINICAL DATA:  Leukocytosis. EXAM: PORTABLE CHEST 1 VIEW COMPARISON:  Radiograph of June 05, 2015. CT scan of January 14, 2018. FINDINGS: The heart size and mediastinal contours are within normal limits. No pneumothorax is noted. Right lung is clear. Mild opacity is noted laterally in left lung base which may represent focal inflammation or atelectasis. Possible  minimal left pleural effusion may be present. The visualized skeletal structures are unremarkable. IMPRESSION: Mild opacity is noted laterally in left lung base which may represent focal inflammation or atelectasis. Possible minimal left pleural effusion. Electronically Signed   By: Lupita RaiderJames  Green Jr, M.D.   On: 06/09/2018 07:12    Lab Data:  CBC: Recent Labs  Lab 06/08/18 1904 06/09/18 0405 06/10/18 0440 06/11/18 0450 06/12/18 0419  WBC 23.4* 19.1* 16.1* 15.8* 12.2*  NEUTROABS  --  15.4*  --   --   --   HGB 17.8* 12.8* 12.8* 13.4 13.0  HCT 54.8* 41.0 40.8 41.5 40.4  MCV 97.5 98.1 98.8 97.4 98.8  PLT 325 218 194 208 227   Basic Metabolic Panel: Recent Labs  Lab 06/08/18 1904 06/09/18 0405 06/10/18 0440 06/11/18 0450 06/12/18 0419  NA 142 137 134* 135 136  K 3.0* 3.1* 3.1* 3.0* 3.1*  CL 82* 95* 97* 97* 98  CO2 38* 34* 24 32 31  GLUCOSE 154* 88 55* 127* 112*  BUN 22* 14 9 <5* <5*  CREATININE 1.18 0.72 0.58* 0.60* 0.62  CALCIUM 10.5* 8.5* 8.2* 8.5* 8.7*  MG 2.2  --  1.6* 1.9  --    GFR: CrCl cannot be calculated (Unknown ideal weight.). Liver Function Tests: Recent Labs  Lab 06/08/18 1904 06/09/18 0405  AST 29 14*  ALT 18 11  ALKPHOS 108 65  BILITOT 0.8 0.8  PROT 9.0* 5.5*  ALBUMIN 4.8 3.0*   Recent Labs  Lab 06/08/18 1904 06/10/18 0440  LIPASE 120* 29   No results for input(s): AMMONIA in the last 168 hours. Coagulation Profile: No results for input(s): INR, PROTIME in  the last 168 hours. Cardiac Enzymes: No results for input(s): CKTOTAL, CKMB, CKMBINDEX, TROPONINI in the last 168 hours. BNP (last 3 results) No results for input(s): PROBNP in the last 8760 hours. HbA1C: No results for input(s): HGBA1C in the last 72 hours. CBG: Recent Labs  Lab 06/10/18 0602 06/10/18 0622  GLUCAP 48* 203*   Lipid Profile: No results for input(s): CHOL, HDL, LDLCALC, TRIG, CHOLHDL, LDLDIRECT in the last 72 hours. Thyroid Function Tests: No results for input(s): TSH, T4TOTAL, FREET4, T3FREE, THYROIDAB in the last 72 hours. Anemia Panel: No results for input(s): VITAMINB12, FOLATE, FERRITIN, TIBC, IRON, RETICCTPCT in the last 72 hours. Urine analysis: No results found for: COLORURINE, APPEARANCEUR, LABSPEC, PHURINE, GLUCOSEU, HGBUR, BILIRUBINUR, KETONESUR, PROTEINUR, UROBILINOGEN, NITRITE, LEUKOCYTESUR   Ripudeep Rai M.D. [facility] 06/12/2018, 1:05 PM  Pager: [phone] Between 7am to 7pm - call Pager - [phone]  After 7pm go to [email or web address] - password [email or web address]  Call night coverage person covering after 7pm

## 2018-06-12 NOTE — Care Management Note (Signed)
Case Management Note  Patient Details  Name: Justin Poole MRN: [id number] Date of Birth: 11/04/1965  Subjective/Objective:                  Homeless issues  Action/Plan: Referral to csw done for homeless issues  Expected Discharge Date:                  Expected Discharge Plan:     In-House Referral:     Discharge planning Services     Post Acute Care Choice:    Choice offered to:     DME Arranged:    DME Agency:     HH Arranged:    HH Agency:     Status of Service:     If discussed at [employer] Meetings, dates discussed:    Additional Comments:  Golda AcreDavis, Rhonda Lynn, RN 06/12/2018, 10:54 AM

## 2018-06-13 DIAGNOSIS — D72829 Elevated white blood cell count, unspecified: Secondary | ICD-10-CM

## 2018-06-13 DIAGNOSIS — K852 Alcohol induced acute pancreatitis without necrosis or infection: Secondary | ICD-10-CM

## 2018-06-13 DIAGNOSIS — K210 Gastro-esophageal reflux disease with esophagitis: Secondary | ICD-10-CM

## 2018-06-13 DIAGNOSIS — K859 Acute pancreatitis without necrosis or infection, unspecified: Principal | ICD-10-CM

## 2018-06-13 DIAGNOSIS — E876 Hypokalemia: Secondary | ICD-10-CM

## 2018-06-13 DIAGNOSIS — I714 Abdominal aortic aneurysm, without rupture: Secondary | ICD-10-CM

## 2018-06-13 DIAGNOSIS — I10 Essential (primary) hypertension: Secondary | ICD-10-CM

## 2018-06-13 LAB — CBC
HCT: 41.3 % (ref 39.0–52.0)
Hemoglobin: 13.4 g/dL (ref 13.0–17.0)
MCH: 32.3 pg (ref 26.0–34.0)
MCHC: 32.4 g/dL (ref 30.0–36.0)
MCV: 99.5 fL (ref 80.0–100.0)
Platelets: 254 10*3/uL (ref 150–400)
RBC: 4.15 MIL/uL — ABNORMAL LOW (ref 4.22–5.81)
RDW: 12.4 % (ref 11.5–15.5)
WBC: 15.3 10*3/uL — ABNORMAL HIGH (ref 4.0–10.5)
nRBC: 0 % (ref 0.0–0.2)

## 2018-06-13 LAB — BASIC METABOLIC PANEL
Anion gap: 7 (ref 5–15)
BUN: <5 mg/dL — ABNORMAL LOW (ref 6–20)
CO2: 28 mmol/L (ref 22–32)
Calcium: 9 mg/dL (ref 8.9–10.3)
Chloride: 100 mmol/L (ref 98–111)
Creatinine, Ser: 0.57 mg/dL — ABNORMAL LOW (ref 0.61–1.24)
GFR calc Af Amer: >60 mL/min (ref >60)
GFR calc non Af Amer: >60 mL/min (ref >60)
Glucose, Bld: 106 mg/dL — ABNORMAL HIGH (ref 70–99)
Potassium: 3.5 mmol/L (ref 3.5–5.1)
Sodium: 135 mmol/L (ref 135–145)

## 2018-06-13 LAB — LIPASE, BLOOD: Lipase: 38 U/L (ref 11–51)

## 2018-06-13 MED ORDER — PANTOPRAZOLE SODIUM 40 MG PO TBEC: 40.0000 mg | DELAYED_RELEASE_TABLET | Freq: Every day | ORAL | 0 refills | Status: DC

## 2018-06-13 MED ORDER — POTASSIUM CHLORIDE CRYS ER 20 MEQ PO TBCR
40.0000 meq | EXTENDED_RELEASE_TABLET | Freq: Once | ORAL | Status: AC
  Administered 2018-06-13: 40 meq via ORAL
  Filled 2018-06-13: qty 2

## 2018-06-13 MED ORDER — ADULT MULTIVITAMIN W/MINERALS CH: 1.0000 | ORAL_TABLET | Freq: Every day | ORAL | Status: DC

## 2018-06-13 MED ORDER — FOLIC ACID 1 MG PO TABS: 1.0000 mg | ORAL_TABLET | Freq: Every day | ORAL | Status: DC

## 2018-06-13 MED ORDER — THIAMINE HCL 100 MG PO TABS: 100.0000 mg | ORAL_TABLET | Freq: Every day | ORAL | Status: DC

## 2018-06-13 NOTE — Discharge Summary (Signed)
Physician Discharge Summary  Justin RoysDanny C Poole ZOX:[id number] DOB: 03/08/1966 DOA: 06/08/2018  PCP: Justin Poole, No Pcp Per  Admit date: 06/08/2018 Discharge date: 06/13/2018  Time spent: 50 minutes  Recommendations for Outpatient Follow-up:  1. Follow-up with Justin Poole, No Pcp Per in 2-3 weeks.  On follow-up Justin Poole's duodenitis will need to be reassessed.  Justin Poole will need to be set up for repeat CT abdomen and pelvis done in 6 months to follow-up on abdominal aneurysm and will need referral to vascular surgery.  On follow-up Justin Poole will need a basic metabolic profile done to follow-up on electrolytes and renal function.  Justin Poole also need a CBC done.   Discharge Diagnoses:  Principal Problem:   Acute pancreatitis Active Problems:   Hypercalcemia   Hypokalemia   Alcohol use   Essential hypertension   GERD (gastroesophageal reflux disease)   AAA (abdominal aortic aneurysm) (HCC)   Discharge Condition: Stable and improved  Diet recommendation: Regular  Filed Weights   06/13/18 0510  Weight: 51.5 kg    History of present illness:  Per Dr. Levora DredgeKakrakandy Ersel C Poole is a 53 y.o. male with history of alcoholism tobacco abuse hypertension GERD presents to the ER with complaints of abdominal pain.  Justin Poole stated Justin Poole had been abdominal pain for last 2 days and epigastrium with no radiation or any vomiting or diarrhea.  Pain had been persistent.  Justin Poole stated he was admitted at [facility] 3 months ago and at the time was first time diagnosed with pancreatitis.  Since then he has been trying to cut down his alcohol and at this time drinks only 1 or 2 drinks a week.  Last drink was 3 days ago following which his pancreatitis started acting up again.  Denied chest pain shortness of breath.  Denied any hematemesis.  Had not taken his medications for many weeks now.  ED Course: In the ER Justin Poole's labs show hematocrit of 54 hemoglobin of 17 lipase of 120 with hypercalcemia and mild  hypokalemia.  CT scan of the abdomen pelvis done showed features concerning for pancreatitis with chronic pseudocyst and there is also concern for duodenitis.  In addition shown his infrarenal abdominal aortic aneurysm slightly enlarged from previous.  Justin Poole admitted for acute pancreatitis IV fluids started along with pain medication.  Hospital Course:  Acute pancreatitis with pseudocyst: Likely due to alcohol use -Justin Poole was admitted with abdominal pain and felt to be in acute on chronic pancreatitis.  CT abdomen and pelvis which was done was consistent with pancreatitis with chronic pseudocyst and also concern for duodenitis.  Justin Poole was maintained on IV PPI, bowel rest, IV fluids, supportive care.  Justin Poole improved clinically on a daily basis and started on a clear liquid diet which he tolerated.  Diet was advanced to a soft diet which was able to tolerate without any further nausea vomiting and abdominal pain.  Justin Poole improved clinically and Justin Poole be discharged home in stable and improved condition.   Active Problems: Acute Duodenitis with history of GERD -Per CT imaging.  Justin Poole was maintained on a PPI throughout the hospitalization.  Justin Poole will be discharged home on a PPI.  Outpatient follow-up with PCP.    Hypercalcemia -Due to dehydration and #1, improved and had resolved with hydration.  Outpatient follow-up.    Hypokalemia -Repleted.   Hypomagnesemia -Repleted.     Alcohol use -Justin Poole counseled on alcohol cessation, and maintained on the CIWA protocol with Ativan, thiamine, folate -Justin Poole had no withdrawal episodes during the hospitalization and seemed  very motivated to completely quit using alcohol.      Essential hypertension -Blood pressure remained stable throughout the hospitalization.    Leukocytosis Likely reactive secondary to acute on chronic pancreatitis.  Justin Poole remained afebrile.  Justin Poole had no signs or symptoms of infection.  Outpatient  follow-up.    AAA (abdominal aortic aneurysm) (HCC) -CT showed 4.8 cm infrarenal AAA increased in size from 4.5 previously. -Recommended CTA abdomen pelvis in 6 months and vascular surgery referral outpatient.  Outpatient follow-up with PCP.  Procedures:  CT abdomen and pelvis 06/08/2018  Chest x-ray 06/09/2018  Consultations:  None  Discharge Exam: Vitals:   06/12/18 2300 06/13/18 0510  BP: (!) 135/98 127/74  Pulse: 71 79  Resp: 16 16  Temp: (!) 97.5 F (36.4 C) (!) 97.5 F (36.4 C)  SpO2: 94% 90%    General: NAD Cardiovascular: RRR Respiratory: CTAB  Discharge Instructions   Discharge Instructions    Diet general   Complete by:  As directed    Increase activity slowly   Complete by:  As directed      Allergies as of 06/13/2018   No Known Allergies     Medication List    TAKE these medications   folic acid 1 MG tablet Commonly known as:  FOLVITE Take 1 tablet (1 mg total) by mouth daily. Start taking on:  June 14, 2018   multivitamin with minerals Tabs tablet Take 1 tablet by mouth daily. Start taking on:  June 14, 2018   pantoprazole 40 MG tablet Commonly known as:  PROTONIX Take 1 tablet (40 mg total) by mouth daily at 6 (six) AM. Start taking on:  June 14, 2018   thiamine 100 MG tablet Take 1 tablet (100 mg total) by mouth daily. Start taking on:  June 14, 2018      No Known Allergies Follow-up Information    PCP. Schedule an appointment as soon as possible for a visit in 2 week(s).   Why:  F/U IN 2-3 WEEKS.           The results of significant diagnostics from this hospitalization (including imaging, microbiology, ancillary and laboratory) are listed below for reference.    Significant Diagnostic Studies: Ct Abdomen Pelvis W Contrast  Result Date: 06/08/2018 CLINICAL DATA:  Abdominal pain with nausea and vomiting. EXAM: CT ABDOMEN AND PELVIS WITH CONTRAST TECHNIQUE: Multidetector CT imaging of the abdomen and pelvis was  performed using the standard protocol following bolus administration of intravenous contrast. CONTRAST:  <See Chart> ISOVUE-300 IOPAMIDOL (ISOVUE-300) INJECTION 61% COMPARISON:  02/16/2018 FINDINGS: Lower chest: Lung bases essentially clear.  Heart normal in size. Hepatobiliary: Decreased attenuation of the liver consistent with fatty infiltration. Liver normal in size. No mass or focal lesion. Normal gallbladder. No bile duct dilation. Pancreas: Cystic mass in the pancreatic head measuring 2.4 x 1.6 x 2.7 cm. This is similar to the prior CT. No other pancreatic masses. Are inflammatory changes at centered adjacent to the first and second portions of the duodenum and in the right upper quadrant adjacent to the a pathic flexure of the colon. These contact the pancreatic head. No other peripancreatic inflammation. Spleen: Normal in size without focal abnormality. Adrenals/Urinary Tract: No adrenal masses. Kidneys normal in size, orientation and position. Tiny low-density lesion in the medial upper pole of the right kidney. Subcentimeter low-density lesion in the medial lower pole of the left kidney. These are likely cysts. No other renal masses or lesions, no stones and no hydronephrosis. Ureters are  normal in course and in caliber. Bladder is unremarkable. Stomach/Bowel: There is wall thickening of the distal esophagus measuring 6-7 mm. Stomach is unremarkable. There is irregular wall thickening of the duodenal bulb and second portion of the duodenum, predominantly the second portion of the duodenum mostly medially where it abuts the pancreatic head. There is surrounding inflammation. Small bowel is normal in caliber. No wall thickening or inflammation. The hepatic flexure of the colon is decompressed with adjacent inflammation. There is fat within the wall. Remainder of the colon is unremarkable. Normal appendix visualized. Vascular/Lymphatic: Abdominal aortic aneurysm. This is a few so formed infrarenal aneurysm, 7  cm in length, 4.8 cm anterior-posterior and 5 cm right to left. This has increased from 4.5 cm anterior-posterior on the prior study. Portal vein, superior mesenteric vein and splenic vein are widely patent. Prominent subcentimeter gastrohepatic ligament lymph nodes. No enlarged lymph nodes. Reproductive: Unremarkable. Other: A small amount of fluid tracks along the right anterior pararenal fascia. Musculoskeletal: No fracture or acute finding. No osteoblastic or osteolytic lesions. IMPRESSION: 1. There are inflammatory changes in the right upper quadrant. 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 However, the inflammation is more centered on the first and second portions of the duodenum, and also abuts the hepatic flexure of the colon. There is irregular wall thickening of the first and second portions of the duodenum. This may be reactive to pancreatitis or reflect duodenitis. No evidence of a perforated ulcer. 2. Wall thickening of the distal esophagus suggesting esophagitis. 3. No other acute findings in the abdomen or pelvis. 4. 4.8 cm infrarenal abdominal aortic aneurysm increased in size from 4.5 cm previously. Recommend followup by abdomen and pelvis CTA in 6 months, and vascular surgery referral/consultation if not already obtained. This recommendation follows ACR consensus guidelines: White Paper of the ACR Incidental Findings Committee II on Vascular Findings. J Am Coll Radiol 2013; 10:789-794. Aortic aneurysm NOS (ICD10-I71.9) 5. Hepatic steatosis. Electronically Signed   By: Amie Portland M.D.   On: 06/08/2018 20:31   Dg Chest Port 1 View  Result Date: 06/09/2018 CLINICAL DATA:  Leukocytosis. EXAM: PORTABLE CHEST 1 VIEW COMPARISON:  Radiograph of June 05, 2015. CT scan of January 14, 2018. FINDINGS: The heart size and mediastinal contours are within  normal limits. No pneumothorax is noted. Right lung is clear. Mild opacity is noted laterally in left lung base which may represent focal inflammation or atelectasis. Possible minimal left pleural effusion may be present. The visualized skeletal structures are unremarkable. IMPRESSION: Mild opacity is noted laterally in left lung base which may represent focal inflammation or atelectasis. Possible minimal left pleural effusion. Electronically Signed   By: Lupita Raider, M.D.   On: 06/09/2018 07:12    Microbiology: Recent Results (from the past 240 hour(s))  Culture, blood (routine x 2)     Status: None (Preliminary result)   Collection Time: 06/09/18 12:15 AM  Result Value Ref Range Status   Specimen Description   Final    BLOOD RIGHT HAND Performed at [facility], [address] [place], [place] [address]    Special Requests   Final    BOTTLES DRAWN AEROBIC AND ANAEROBIC Blood Culture adequate volume Performed at [facility], [address], [place], [place] [address]    Culture   Final    NO GROWTH 3  DAYS Performed at [facility], [address] [place], [place] [address]    Report Status PENDING  Incomplete  Culture, blood (routine x 2)     Status: None (Preliminary result)   Collection Time: 06/09/18 12:16 AM  Result Value Ref Range Status   Specimen Description   Final    BLOOD RIGHT ANTECUBITAL Performed at [facility], [address] [place], [place] [address]    Special Requests   Final    BOTTLES DRAWN AEROBIC AND ANAEROBIC Blood Culture results may not be optimal due to an excessive volume of blood received in culture bottles Performed at [facility], [address] [place], [place] [address]    Culture   Final    NO GROWTH 3 DAYS Performed at [facility], [address] [place], [place] [address]    Report Status PENDING  Incomplete     Labs: Basic Metabolic  Panel: Recent Labs  Lab 06/08/18 1904 06/09/18 0405 06/10/18 0440 06/11/18 0450 06/12/18 0419 06/13/18 0440  NA 142 137 134* 135 136 135  K 3.0* 3.1* 3.1* 3.0* 3.1* 3.5  CL 82* 95* 97* 97* 98 100  CO2 38* 34* 24 32 31 28  GLUCOSE 154* 88 55* 127* 112* 106*  BUN 22* 14 9 <5* <5* <5*  CREATININE 1.18 0.72 0.58* 0.60* 0.62 0.57*  CALCIUM 10.5* 8.5* 8.2* 8.5* 8.7* 9.0  MG 2.2  --  1.6* 1.9  --   --    Liver Function Tests: Recent Labs  Lab 06/08/18 1904 06/09/18 0405  AST 29 14*  ALT 18 11  ALKPHOS 108 65  BILITOT 0.8 0.8  PROT 9.0* 5.5*  ALBUMIN 4.8 3.0*   Recent Labs  Lab 06/08/18 1904 06/10/18 0440 06/13/18 0440  LIPASE 120* 29 38   No results for input(s): AMMONIA in the last 168 hours. CBC: Recent Labs  Lab 06/09/18 0405 06/10/18 0440 06/11/18 0450 06/12/18 0419 06/13/18 0440  WBC 19.1* 16.1* 15.8* 12.2* 15.3*  NEUTROABS 15.4*  --   --   --   --   HGB 12.8* 12.8* 13.4 13.0 13.4  HCT 41.0 40.8 41.5 40.4 41.3  MCV 98.1 98.8 97.4 98.8 99.5  PLT 218 194 208 227 254   Cardiac Enzymes: No results for input(s): CKTOTAL, CKMB, CKMBINDEX, TROPONINI in the last 168 hours. BNP: BNP (last 3 results) No results for input(s): BNP in the last 8760 hours.  ProBNP (last 3 results) No results for input(s): PROBNP in the last 8760 hours.  CBG: Recent Labs  Lab 06/10/18 0602 06/10/18 0622  GLUCAP 48* 203*       Signed:  Ramiro Harvestaniel  MD.  [facility] 06/13/2018, 12:34 PM

## 2018-06-13 NOTE — Progress Notes (Deleted)
Pt had 2.3 second pause on telemetry. Asymptomatic. MD made aware. No new orders at this time. ADAMSON, Lavone Orn, RN

## 2018-06-14 LAB — CULTURE, BLOOD (ROUTINE X 2)
Culture: NO GROWTH
Culture: NO GROWTH
Special Requests: ADEQUATE

## 2019-07-14 ENCOUNTER — Emergency Department (HOSPITAL_COMMUNITY)
Admission: EM | Admit: 2019-07-14 | Discharge: 2019-07-14 | Disposition: A | Discharge status: Home / Self Care | Payer: Self-pay | Attending: Emergency Medicine | Admitting: Emergency Medicine

## 2019-07-14 ENCOUNTER — Other Ambulatory Visit: Payer: Self-pay

## 2019-07-14 ENCOUNTER — Encounter (HOSPITAL_COMMUNITY): Payer: Self-pay | Admitting: Obstetrics and Gynecology

## 2019-07-14 DIAGNOSIS — F17210 Nicotine dependence, cigarettes, uncomplicated: Secondary | ICD-10-CM | POA: Insufficient documentation

## 2019-07-14 DIAGNOSIS — L509 Urticaria, unspecified: Secondary | ICD-10-CM | POA: Insufficient documentation

## 2019-07-14 DIAGNOSIS — Z79899 Other long term (current) drug therapy: Secondary | ICD-10-CM | POA: Insufficient documentation

## 2019-07-14 DIAGNOSIS — I10 Essential (primary) hypertension: Secondary | ICD-10-CM | POA: Insufficient documentation

## 2019-07-14 MED ORDER — PREDNISONE 20 MG PO TABS: 40.0000 mg | ORAL_TABLET | Freq: Every day | ORAL | 0 refills | Status: DC

## 2019-07-14 MED ORDER — METHYLPREDNISOLONE SODIUM SUCC 125 MG IJ SOLR
125.0000 mg | Freq: Once | INTRAMUSCULAR | Status: AC
  Administered 2019-07-14: 125 mg via INTRAVENOUS
  Filled 2019-07-14: qty 2

## 2019-07-14 MED ORDER — CETIRIZINE HCL 10 MG PO TABS: 10.0000 mg | ORAL_TABLET | Freq: Every day | ORAL | 0 refills | Status: DC

## 2019-07-14 MED ORDER — FAMOTIDINE IN NACL 20-0.9 MG/50ML-% IV SOLN
20.0000 mg | Freq: Once | INTRAVENOUS | Status: AC
  Administered 2019-07-14: 20 mg via INTRAVENOUS
  Filled 2019-07-14: qty 50

## 2019-07-14 NOTE — ED Notes (Signed)
Discharge paperwork and prescriptions reviewed with pt.  Pt with no questions or concerns at this time.  Pt able to speak in full sentences, on room air at time of discharge.  Ambulatory out of ED.

## 2019-07-14 NOTE — ED Triage Notes (Signed)
Patient reports pain from a rash on his arms, legs, trunk and buttocks. Patient has had 50mg  of IV benadryl with EMS. Paitnet's vitals stable per EMS CBG 152 Temp: 99.1 Hx of asthma and COPD

## 2019-07-14 NOTE — ED Provider Notes (Signed)
[facility]-EMERGENCY DEPT Provider Note   CSN: [id number] Arrival date & time: 07/14/19  1114     History Chief Complaint  Patient presents with  . Rash    Justin Poole is a 54 y.o. male.  The history is provided by the patient.  Rash Location:  Full body Quality: itchiness and redness   Severity:  Moderate Onset quality:  Sudden Duration:  1 day Timing:  Constant Progression:  Spreading Chronicity:  New Context comment:  No known new exposures.  No new medications Relieved by:  None tried Worsened by:  Nothing Ineffective treatments:  None tried Associated symptoms: nausea   Associated symptoms: no abdominal pain, no fever, no headaches, no throat swelling, no tongue swelling, not vomiting and not wheezing   Associated symptoms comment:  Nausea      Past Medical History:  Diagnosis Date  . Hypertension   . Pancreatitis   . Pancreatitis     Patient Active Problem List   Diagnosis Date Noted  . Leukocytosis   . Alcohol use 06/09/2018  . Essential hypertension 06/09/2018  . GERD (gastroesophageal reflux disease) 06/09/2018  . AAA (abdominal aortic aneurysm) (HCC) 06/09/2018  . Acute pancreatitis 06/08/2018  . Hypercalcemia 06/08/2018  . Hypokalemia 06/08/2018    History reviewed. No pertinent surgical history.     Family History  Problem Relation Age of Onset  . CAD Mother   . CAD Father   . Diabetes Mellitus II Neg Hx     Social History   Tobacco Use  . Smoking status: Current Every Day Smoker    Packs/day: 1.00    Types: Cigarettes  . Smokeless tobacco: Never Used  Substance Use Topics  . Alcohol use: Yes  . Drug use: Not Currently    Home Medications Prior to Admission medications   Medication Sig Start Date End Date Taking? Authorizing Provider  folic acid (FOLVITE) 1 MG tablet Take 1 tablet (1 mg total) by mouth daily. 06/14/18   Rodolph Bong, MD  Multiple Vitamin (MULTIVITAMIN WITH MINERALS) TABS tablet  Take 1 tablet by mouth daily. 06/14/18   Rodolph Bong, MD  pantoprazole (PROTONIX) 40 MG tablet Take 1 tablet (40 mg total) by mouth daily at 6 (six) AM. 06/14/18   Rodolph Bong, MD  thiamine 100 MG tablet Take 1 tablet (100 mg total) by mouth daily. 06/14/18   Rodolph Bong, MD    Allergies    Patient has no known allergies.  Review of Systems   Review of Systems  Constitutional: Negative for fever.  Respiratory: Negative for wheezing.   Gastrointestinal: Positive for nausea. Negative for abdominal pain and vomiting.  Skin: Positive for rash.  Neurological: Negative for headaches.  All other systems reviewed and are negative.   Physical Exam Updated Vital Signs BP (!) 141/100   Pulse 99   Temp 98 F (36.7 C)   Resp 20   Ht 5\' 4"  (1.626 m)   Wt 56.7 kg   SpO2 97%   BMI 21.46 kg/m   Physical Exam Vitals and nursing note reviewed.  Constitutional:      General: He is not in acute distress.    Appearance: Normal appearance. He is normal weight.  HENT:     Head: Normocephalic.     Nose: Nose normal.     Mouth/Throat:     Mouth: Mucous membranes are moist.  Eyes:     Extraocular Movements: Extraocular movements intact.  Pupils: Pupils are equal, round, and reactive to light.  Cardiovascular:     Rate and Rhythm: Normal rate.     Pulses: Normal pulses.  Pulmonary:     Effort: Pulmonary effort is normal.     Breath sounds: Normal breath sounds.  Abdominal:     General: Abdomen is flat. Bowel sounds are normal.     Palpations: Abdomen is soft.  Musculoskeletal:     Right lower leg: No edema.     Left lower leg: No edema.  Skin:    Findings: Rash present.     Comments: Urticarial rash present over the upper extremities and abdomen.  Psoriasis present on bilateral lower extremities with excoriation and scabbing.  Neurological:     General: No focal deficit present.     Mental Status: He is alert.  Psychiatric:        Mood and Affect: Mood normal.         Behavior: Behavior normal.        Thought Content: Thought content normal.     ED Results / Procedures / Treatments   Labs (all labs ordered are listed, but only abnormal results are displayed) Labs Reviewed - No data to display  EKG None  Radiology No results found.  Procedures Procedures (including critical care time)  Medications Ordered in ED Medications  methylPREDNISolone sodium succinate (SOLU-MEDROL) 125 mg/2 mL injection 125 mg (has no administration in time range)  famotidine (PEPCID) IVPB 20 mg premix (has no administration in time range)    ED Course  I have reviewed the triage vital signs and the nursing notes.  Pertinent labs & imaging results that were available during my care of the patient were reviewed by me and considered in my medical decision making (see chart for details).    MDM Rules/Calculators/A&P                      Patient presenting with urticarial rash today without known allergen.  No prior history of similar.  Patient denies any history of recent infectious symptoms or URIs.  He is currently not taking any medications because he states he cannot afford the metoprolol and Protonix and is requesting assistance with this.  Consult was sent to social work for contact later today or tomorrow for help with home medications.  Patient was given Benadryl by EMS with some improvement.  Patient given a dose of Solu-Medrol and Pepcid.  No airway involvement at this time.  Will discharge patient home with prednisone and Zyrtec.  1:57 PM Rash has improved.  Will d/c with prednisone and zyrtec  Final Clinical Impression(s) / ED Diagnoses Final diagnoses:  Urticaria    Rx / DC Orders ED Discharge Orders         Ordered    predniSONE (DELTASONE) 20 MG tablet  Daily     07/14/19 1358    cetirizine (ZYRTEC ALLERGY) 10 MG tablet  Daily     07/14/19 1358           Blanchie Dessert, MD 07/14/19 1358

## 2019-07-14 NOTE — Care Management (Signed)
  MATCH Medication Assistance Card Name: Justin Poole ID (MRN): [id number] Bin: [id number] RX Group: [id number] Discharge Date: 07/14/2019 Expiration Date: 07/24/2019                                           (must be filled within 7 days of discharge)        Dear   : Justin Poole  You have been approved to have the prescriptions written by your discharging physician filled through our [facility] (Medication Assistance Through [facility]) program. This program allows for a one-time (no refills) 34-day supply of selected medications for a low copay amount.  The copay is $3.00 per prescription. For instance, if you have one prescription, you will pay $3.00; for two prescriptions, you pay $6.00; for three prescriptions, you pay $9.00; and so on.  Only certain pharmacies are participating in this program with [facility]. You will need to select one of the pharmacies from the attached list and take your prescriptions, this letter, and your photo ID to one of the participating pharmacies.   We are excited that you are able to use the [facility] program to get your medications. These prescriptions must be filled within 7 days of hospital discharge or they will no longer be valid for the [facility] program. Should you have any problems with your prescriptions please contact your case management team member at [phone] for [place]/ [facility].  Thank you, [facility]

## 2019-07-14 NOTE — Discharge Instructions (Addendum)
Primary Care Providers for Clients without [facility]  Phone: [phone]  Address: [address] [place], [place] [address]  [facility]  Phone: [phone]  Address: [address] [place], [place] [address]  [facility]  Phone: [phone]  Address: [address] [place], [place], [place] [address]  [facility]  Phone: [phone]  Address: [address] [place], [place], [place] [address]

## 2019-07-14 NOTE — Progress Notes (Signed)
CSW attempted to speak with patient to review resources for consult.  CSW added primary care and medication management resources to patient's AVS for follow up. CSW unable to schedule follow up appointments and coordinate care as these providers are not open on weekends.  CSW signing off, please re-consult if additional social work needs arise.  Enid Cutter, LCSW-A Clinical Social Worker [phone]

## 2019-07-16 ENCOUNTER — Telehealth: Payer: Self-pay

## 2019-07-16 NOTE — Telephone Encounter (Signed)
Message received from Michel Bickers, RN CM requesting follow up appointment for patient at [facility].  Call placed to patient # [phone], James [place] answered and said that he is a friend of the patient and the patient does not have his own phone. He said that he could give the patient a message to return the call to this CM. He has the clinic phone number  The clinic phone number is also on the patient's ED AVS.  Update provided to Beecher Mcardle, RN CM

## 2019-11-24 ENCOUNTER — Other Ambulatory Visit: Payer: Self-pay

## 2019-11-24 ENCOUNTER — Emergency Department (HOSPITAL_COMMUNITY)
Admission: EM | Admit: 2019-11-24 | Discharge: 2019-11-24 | Disposition: A | Discharge status: Home / Self Care | Payer: Self-pay | Attending: Emergency Medicine | Admitting: Emergency Medicine

## 2019-11-24 DIAGNOSIS — R112 Nausea with vomiting, unspecified: Secondary | ICD-10-CM | POA: Insufficient documentation

## 2019-11-24 DIAGNOSIS — R1030 Lower abdominal pain, unspecified: Secondary | ICD-10-CM | POA: Insufficient documentation

## 2019-11-24 DIAGNOSIS — Z5321 Procedure and treatment not carried out due to patient leaving prior to being seen by health care provider: Secondary | ICD-10-CM | POA: Insufficient documentation

## 2019-11-24 DIAGNOSIS — Z8719 Personal history of other diseases of the digestive system: Secondary | ICD-10-CM | POA: Insufficient documentation

## 2019-11-24 LAB — CBC
HCT: 51.4 % (ref 39.0–52.0)
Hemoglobin: 17.2 g/dL — ABNORMAL HIGH (ref 13.0–17.0)
MCH: 31.9 pg (ref 26.0–34.0)
MCHC: 33.5 g/dL (ref 30.0–36.0)
MCV: 95.4 fL (ref 80.0–100.0)
Platelets: 340 10*3/uL (ref 150–400)
RBC: 5.39 MIL/uL (ref 4.22–5.81)
RDW: 14 % (ref 11.5–15.5)
WBC: 15.5 10*3/uL — ABNORMAL HIGH (ref 4.0–10.5)
nRBC: 0 % (ref 0.0–0.2)

## 2019-11-24 LAB — COMPREHENSIVE METABOLIC PANEL
ALT: 14 U/L (ref 0–44)
AST: 23 U/L (ref 15–41)
Albumin: 4.6 g/dL (ref 3.5–5.0)
Alkaline Phosphatase: 117 U/L (ref 38–126)
Anion gap: 15 (ref 5–15)
BUN: 8 mg/dL (ref 6–20)
CO2: 27 mmol/L (ref 22–32)
Calcium: 9.6 mg/dL (ref 8.9–10.3)
Chloride: 98 mmol/L (ref 98–111)
Creatinine, Ser: 0.69 mg/dL (ref 0.61–1.24)
GFR calc Af Amer: >60 mL/min (ref >60)
GFR calc non Af Amer: >60 mL/min (ref >60)
Glucose, Bld: 136 mg/dL — ABNORMAL HIGH (ref 70–99)
Potassium: 3.9 mmol/L (ref 3.5–5.1)
Sodium: 140 mmol/L (ref 135–145)
Total Bilirubin: 0.8 mg/dL (ref 0.3–1.2)
Total Protein: 8.3 g/dL — ABNORMAL HIGH (ref 6.5–8.1)

## 2019-11-24 LAB — LIPASE, BLOOD: Lipase: 25 U/L (ref 11–51)

## 2019-11-24 NOTE — ED Triage Notes (Signed)
Patient bib gems, lower abd pain, nausea, vomiting since 10pm last night. History of pancreatitis - no fever. 176/118, hr 110, rr 20, 97%RA, cbg 143 per EMS - pain 7/10

## 2020-03-16 ENCOUNTER — Encounter (HOSPITAL_COMMUNITY): Payer: Self-pay

## 2020-03-16 ENCOUNTER — Emergency Department (HOSPITAL_COMMUNITY): Payer: Self-pay

## 2020-03-16 ENCOUNTER — Emergency Department (HOSPITAL_COMMUNITY)
Admission: EM | Admit: 2020-03-16 | Discharge: 2020-03-16 | Disposition: A | Discharge status: Home / Self Care | Payer: Self-pay | Attending: Emergency Medicine | Admitting: Emergency Medicine

## 2020-03-16 ENCOUNTER — Other Ambulatory Visit: Payer: Self-pay

## 2020-03-16 DIAGNOSIS — I714 Abdominal aortic aneurysm, without rupture, unspecified: Secondary | ICD-10-CM

## 2020-03-16 DIAGNOSIS — M545 Low back pain, unspecified: Secondary | ICD-10-CM | POA: Insufficient documentation

## 2020-03-16 DIAGNOSIS — F17210 Nicotine dependence, cigarettes, uncomplicated: Secondary | ICD-10-CM | POA: Insufficient documentation

## 2020-03-16 DIAGNOSIS — I10 Essential (primary) hypertension: Secondary | ICD-10-CM | POA: Insufficient documentation

## 2020-03-16 DIAGNOSIS — Z79899 Other long term (current) drug therapy: Secondary | ICD-10-CM | POA: Insufficient documentation

## 2020-03-16 LAB — CBC WITH DIFFERENTIAL/PLATELET
Abs Immature Granulocytes: 0.05 10*3/uL (ref 0.00–0.07)
Basophils Absolute: 0 10*3/uL (ref 0.0–0.1)
Basophils Relative: 0 %
Eosinophils Absolute: 0.1 10*3/uL (ref 0.0–0.5)
Eosinophils Relative: 1 %
HCT: 49.9 % (ref 39.0–52.0)
Hemoglobin: 16.5 g/dL (ref 13.0–17.0)
Immature Granulocytes: 1 %
Lymphocytes Relative: 13 %
Lymphs Abs: 1.1 10*3/uL (ref 0.7–4.0)
MCH: 31.5 pg (ref 26.0–34.0)
MCHC: 33.1 g/dL (ref 30.0–36.0)
MCV: 95.2 fL (ref 80.0–100.0)
Monocytes Absolute: 0.9 10*3/uL (ref 0.1–1.0)
Monocytes Relative: 10 %
Neutro Abs: 6.7 10*3/uL (ref 1.7–7.7)
Neutrophils Relative %: 75 %
Platelets: 329 10*3/uL (ref 150–400)
RBC: 5.24 MIL/uL (ref 4.22–5.81)
RDW: 14 % (ref 11.5–15.5)
WBC: 8.9 10*3/uL (ref 4.0–10.5)
nRBC: 0 % (ref 0.0–0.2)

## 2020-03-16 LAB — BASIC METABOLIC PANEL
Anion gap: 10 (ref 5–15)
BUN: 12 mg/dL (ref 6–20)
CO2: 28 mmol/L (ref 22–32)
Calcium: 9.3 mg/dL (ref 8.9–10.3)
Chloride: 100 mmol/L (ref 98–111)
Creatinine, Ser: 0.61 mg/dL (ref 0.61–1.24)
GFR, Estimated: >60 mL/min (ref >60)
Glucose, Bld: 120 mg/dL — ABNORMAL HIGH (ref 70–99)
Potassium: 3.7 mmol/L (ref 3.5–5.1)
Sodium: 138 mmol/L (ref 135–145)

## 2020-03-16 MED ORDER — FENTANYL CITRATE (PF) 100 MCG/2ML IJ SOLN
100.0000 ug | Freq: Once | INTRAMUSCULAR | Status: AC
  Administered 2020-03-16: 100 ug via INTRAVENOUS
  Filled 2020-03-16: qty 2

## 2020-03-16 MED ORDER — ONDANSETRON HCL 4 MG/2ML IJ SOLN
4.0000 mg | Freq: Once | INTRAMUSCULAR | Status: AC
  Administered 2020-03-16: 4 mg via INTRAVENOUS
  Filled 2020-03-16: qty 2

## 2020-03-16 MED ORDER — KETOROLAC TROMETHAMINE 60 MG/2ML IM SOLN: 30.0000 mg | Freq: Once | INTRAMUSCULAR | Status: DC

## 2020-03-16 MED ORDER — IOHEXOL 350 MG/ML SOLN
100.0000 mL | Freq: Once | INTRAVENOUS | Status: AC | PRN
  Administered 2020-03-16: 100 mL via INTRAVENOUS

## 2020-03-16 MED ORDER — METHOCARBAMOL 500 MG PO TABS: 500.0000 mg | ORAL_TABLET | Freq: Four times a day (QID) | ORAL | 0 refills | Status: DC

## 2020-03-16 MED ORDER — AMLODIPINE BESYLATE 5 MG PO TABS: 5.0000 mg | ORAL_TABLET | Freq: Every day | ORAL | 1 refills | Status: DC

## 2020-03-16 MED ORDER — OXYCODONE HCL 5 MG PO TABS
5.0000 mg | ORAL_TABLET | Freq: Four times a day (QID) | ORAL | 0 refills | Status: DC | PRN
Start: 2020-03-16 — End: 2020-03-24

## 2020-03-16 MED ORDER — METHOCARBAMOL 500 MG PO TABS: 1000.0000 mg | ORAL_TABLET | Freq: Once | ORAL | Status: DC

## 2020-03-16 NOTE — Discharge Instructions (Signed)
Please read and follow all provided instructions.  Your diagnoses today include:  1. Acute left-sided low back pain without sciatica   2. Primary hypertension   3. AAA (abdominal aortic aneurysm) without rupture (HCC)     Tests performed today include:  Vital signs - see below for your results today Blood cell counts (white, red, and platelets) Electrolytes  Kidney function test CT scan of your abdomen -have a large aneurysm in your lower abdomen that measures 5.9 cm.  This is large enough to require follow-up with a vascular surgeon for possible surgery.  If the aneurysm is to rupture, it would be life-threatening.  Medications prescribed:   Amlodipine -medication to help control blood pressure   Oxycodone - narcotic pain medication  DO NOT drive or perform any activities that require you to be awake and alert because this medicine can make you drowsy.    Robaxin (methocarbamol) - muscle relaxer medication  DO NOT drive or perform any activities that require you to be awake and alert because this medicine can make you drowsy.   Take any prescribed medications only as directed.  Home care instructions:   Follow any educational materials contained in this packet  Please rest, use ice or heat on your back for the next several days  Do not lift, push, pull anything more than 10 pounds for the next week  Follow-up instructions:  Please call [facility] if you do not have a primary care doctor.  This is important to have control your blood pressure.  Also Dr. Ophelia Charter office information as above.  They will likely try to call you to schedule an appointment.  Please call them if you do not hear from them in the next day or two.   Return instructions:  SEEK IMMEDIATE MEDICAL ATTENTION IF YOU HAVE:  New numbness, tingling, weakness, or problem with the use of your arms or legs  Severe back pain not relieved with medications  Loss control of your  bowels or bladder  Increasing pain in any areas of the body (such as chest or abdominal pain)  Shortness of breath, dizziness, or fainting.   Worsening nausea (feeling sick to your stomach), vomiting, fever, or sweats  Any other emergent concerns regarding your health   Additional Information:  Your vital signs today were: BP (!) 184/117    Pulse 74    Temp (!) 97.5 F (36.4 C) (Oral)    Resp 20    Ht 5\' 1"  (1.549 m)    Wt 54.4 kg    SpO2 95%    BMI 22.67 kg/m  If your blood pressure (BP) was elevated above 135/85 this visit, please have this repeated by your doctor within one month. --------------

## 2020-03-16 NOTE — ED Triage Notes (Signed)
Pt BIB EMS from home reporting left sided lumbar back pain starting Friday night. Denies injury or precipitating event. Reports not taking antihypertensives. Able to ambulate.   BP 170/108 HR 112

## 2020-03-16 NOTE — ED Provider Notes (Signed)
[facility]-EMERGENCY DEPT Provider Note   CSN: [id number] Arrival date & time: 03/16/20  0735     History Chief Complaint  Patient presents with  . Back Pain    Justin Poole is a 54 y.o. male.  Patient with history of pancreatitis, alcohol use disorder, known 4.8 cm infrarenal AAA --presents the emergency department today for evaluation of back pain.  Patient was brought to the emergency department by EMS.  Patient states that he has had unrelenting pain in his left lower back ongoing for the past 3 days.  No injury at onset.  The pain does not radiate down into his legs and he denies weakness in the legs.  He has been able to ambulate.  He took aspirin without improvement.  No other treatments prior to arrival.  Nothing makes his symptoms better.  Movements make the pain worse.  He denies any significant abdominal pain.  When asked about his AAA in the follow-up, patient does not seem to be aware that he has an aneurysm in his abdomen.  He has not had any follow-up for this since CT of the abdomen performed in January 2020.  Onset of symptoms acute.  Course is constant.        Past Medical History:  Diagnosis Date  . Hypertension   . Pancreatitis   . Pancreatitis     Patient Active Problem List   Diagnosis Date Noted  . Leukocytosis   . Alcohol use 06/09/2018  . Essential hypertension 06/09/2018  . GERD (gastroesophageal reflux disease) 06/09/2018  . AAA (abdominal aortic aneurysm) (HCC) 06/09/2018  . Acute pancreatitis 06/08/2018  . Hypercalcemia 06/08/2018  . Hypokalemia 06/08/2018    History reviewed. No pertinent surgical history.     Family History  Problem Relation Age of Onset  . CAD Mother   . CAD Father   . Diabetes Mellitus II Neg Hx     Social History   Tobacco Use  . Smoking status: Current Every Day Smoker    Packs/day: 1.00    Types: Cigarettes  . Smokeless tobacco: Never Used  Substance Use Topics  . Alcohol use: Yes   . Drug use: Not Currently    Home Medications Prior to Admission medications   Medication Sig Start Date End Date Taking? Authorizing Provider  cetirizine (ZYRTEC ALLERGY) 10 MG tablet Take 1 tablet (10 mg total) by mouth daily. 07/14/19   Gwyneth Sprout, MD  folic acid (FOLVITE) 1 MG tablet Take 1 tablet (1 mg total) by mouth daily. 06/14/18   Rodolph Bong, MD  Multiple Vitamin (MULTIVITAMIN WITH MINERALS) TABS tablet Take 1 tablet by mouth daily. 06/14/18   Rodolph Bong, MD  pantoprazole (PROTONIX) 40 MG tablet Take 1 tablet (40 mg total) by mouth daily at 6 (six) AM. 06/14/18   Rodolph Bong, MD  predniSONE (DELTASONE) 20 MG tablet Take 2 tablets (40 mg total) by mouth daily. 07/14/19   Gwyneth Sprout, MD  thiamine 100 MG tablet Take 1 tablet (100 mg total) by mouth daily. 06/14/18   Rodolph Bong, MD    Allergies    Patient has no known allergies.  Review of Systems   Review of Systems  Constitutional: Negative for fever and unexpected weight change.  HENT: Negative for rhinorrhea and sore throat.   Eyes: Negative for redness.  Respiratory: Negative for cough.   Cardiovascular: Negative for chest pain.  Gastrointestinal: Negative for abdominal pain, constipation, diarrhea, nausea and vomiting.  Neg for fecal incontinence  Genitourinary: Positive for flank pain. Negative for difficulty urinating, dysuria and hematuria.       Negative for urinary incontinence or retention  Musculoskeletal: Positive for back pain. Negative for myalgias.  Skin: Negative for rash.  Neurological: Negative for weakness, numbness and headaches.       Negative for saddle paresthesias     Physical Exam Updated Vital Signs BP (!) 160/117 (BP Location: Left Arm)   Pulse (!) 106 Comment: Simultaneous filing. User may not have seen previous data.  Temp (!) 97.5 F (36.4 C) (Oral)   Resp 16   Ht 5\' 1"  (1.549 m)   Wt 54.4 kg   SpO2 96%   BMI 22.67 kg/m   Physical Exam Vitals  and nursing note reviewed.  Constitutional:      Appearance: He is well-developed.  HENT:     Head: Normocephalic and atraumatic.  Eyes:     Conjunctiva/sclera: Conjunctivae normal.  Abdominal:     Palpations: Abdomen is soft.     Tenderness: There is no abdominal tenderness.  Musculoskeletal:        General: No tenderness. Normal range of motion.     Cervical back: Normal range of motion.       Back:     Comments: No step-off noted with palpation of spine.   Skin:    General: Skin is warm and dry.  Neurological:     Mental Status: He is alert.     Sensory: No sensory deficit.     Motor: No abnormal muscle tone.     Deep Tendon Reflexes: Reflexes are normal and symmetric.     Comments: 5/5 strength in entire lower extremities bilaterally. No sensation deficit.      ED Results / Procedures / Treatments   Labs (all labs ordered are listed, but only abnormal results are displayed) Labs Reviewed  CBC WITH DIFFERENTIAL/PLATELET  BASIC METABOLIC PANEL    EKG None  Radiology No results found.  Procedures Procedures (including critical care time)  Medications Ordered in ED Medications  fentaNYL (SUBLIMAZE) injection 100 mcg (has no administration in time range)  ondansetron (ZOFRAN) injection 4 mg (has no administration in time range)    ED Course  I have reviewed the triage vital signs and the nursing notes.  Pertinent labs & imaging results that were available during my care of the patient were reviewed by me and considered in my medical decision making (see chart for details).  Patient seen and examined.  Patient appears uncomfortable and is somewhat irritable when answering questions.  I discussed possible work-up to evaluate for enlargement of his known aneurysm.  He does not really seem to care, only asking for pain medication.  I feel that would be prudent to evaluate AAA at this time given lack of follow-up, although pain does have features consistent with  musculoskeletal back pain.  CT ordered.  Vital signs reviewed and are as follows: BP (!) 160/117 (BP Location: Left Arm)   Pulse (!) 106 Comment: Simultaneous filing. User may not have seen previous data.  Temp (!) 97.5 F (36.4 C) (Oral)   Resp 16   Ht 5\' 1"  (1.549 m)   Wt 54.4 kg   SpO2 96%   BMI 22.67 kg/m   CT shows similar appearing, but enlarged AAA, now measuring 5.9 cm.  I discussed the case with Dr. of vascular surgery who is on-call.  He is in the ER, but this case case by  telephone.  Agrees that patient can be followed up as an outpatient.  His office will attempt to call the patient and schedule follow-up appointment.  I went and discussed plan with patient.  Pain returning, but was better improved.  Will give another dose of pain medication.  Plan for discharge to home.  We had a lengthy discussion about his large aneurysm and the fact that this is life-threatening if it were to rupture.  Discussed need for close PCP follow-up for blood pressure control and vascular surgery follow-up.  He is interested and would like clinic information written down for him.  We will discharged home on low-dose amlodipine, 6 tablets of oxycodone, and Robaxin.  I reassessed the patient's abdomen and he has no anterior abdominal tenderness.  Pain remains reproducible and in the lower back only.  Patient encouraged to return if he ever develops abdominal pain, lightheadedness, syncope.    MDM Rules/Calculators/A&P                          Back pain: Patient with reproducible back pain. No neurological deficits. Patient is ambulatory. No warning symptoms of back pain including: fecal incontinence, urinary retention or overflow incontinence, night sweats, waking from sleep with back pain, unexplained fevers or weight loss, h/o cancer, IVDU, recent trauma. No concern for cauda equina, epidural abscess, or other serious cause of back pain.  Patient does have known AAA.  Unfortunately he has  been lost to follow-up and has not seen a vascular surgeon.  Patient was reevaluated for this today and found enlarged AAA without signs of rupture.  I doubt that the back pain is related to the AAA.  Patient has reproducible pain with palpation and movement in the area the back and now abdominal pain.  Conservative measures such as rest, ice/heat and pain medicine indicated with PCP/vascular follow-up to address multiple concerns.      Final Clinical Impression(s) / ED Diagnoses Final diagnoses:  Primary hypertension  Acute left-sided low back pain without sciatica  AAA (abdominal aortic aneurysm) without rupture (HCC)    Rx / DC Orders ED Discharge Orders         Ordered    oxyCODONE (OXY IR/ROXICODONE) 5 MG immediate release tablet  Every 6 hours PRN        03/16/20 1048    methocarbamol (ROBAXIN) 500 MG tablet  4 times daily        03/16/20 1048    amLODipine (NORVASC) 5 MG tablet  Daily        03/16/20 1048           Renne Crigler, PA-C 03/16/20 1058    Linwood Dibbles, MD 03/18/20 [phone]

## 2020-03-16 NOTE — ED Notes (Signed)
PA at bedside.

## 2020-03-24 ENCOUNTER — Encounter: Payer: Self-pay | Admitting: Vascular Surgery

## 2020-03-24 ENCOUNTER — Ambulatory Visit (INDEPENDENT_AMBULATORY_CARE_PROVIDER_SITE_OTHER): Payer: Self-pay | Admitting: Vascular Surgery

## 2020-03-24 ENCOUNTER — Other Ambulatory Visit: Payer: Self-pay

## 2020-03-24 VITALS — BP 130/89 | HR 90 | Temp 98.7°F | Resp 20 | Ht 61.0 in | Wt 114.0 lb

## 2020-03-24 DIAGNOSIS — I714 Abdominal aortic aneurysm, without rupture, unspecified: Secondary | ICD-10-CM

## 2020-03-24 NOTE — Progress Notes (Signed)
ASSESSMENT & PLAN:  Justin Poole is a 54 y.o. male with a infrarenal abdominal aortic aneurysm measuring 36mm in greatest orthogonal dimension.  A statement from the Joint Council of the American Association for Vascular Surgery and Society for Vascular Surgery estimated the annual rupture risk according to AAA diameter to be the following: 5.0 cm to 5.9 cm in diameter - 3% to 15%  The patient is good a candidate for elective repair of the aneurysm to prevent rupture.  I explained the risks / benefits / alternatives to different approaches to aortic reconstruction.   I explained the specific benefits of open repair including improved durability, limited requirement for surveillance, less need for secondary intervention. I explained the specific risks from open repair including higher physiologic stress from aortic cross clamping, higher risk of perioperative complication (including stroke, MI, pneumonia, renal insufficiency and failure, death, etc.), higher risk of abdominal wall complications, risk of anastomotic pseudoaneurysm.  I explained the specific benefits of endovascular repair including limited physiologic stress and less recovery time, lower risk of serious perioperative complication, zero risk of abdominal wall complication.  I explained the specific risks from endovascular repair including need for lifetime surveillance, risk of large-bore arterial access, risk of requiring secondary intervention to maintain seal or patency. I explained that not all patients are candidates for endovascular repair based on their unique anatomy.  After detailed discussion the patient desires open repair.    The patient should continue best medical therapy including: Complete cessation from all tobacco products. Excellent blood glucose control with goal A1c < 7%. Blood pressure control with goal blood pressure < 140/90 mmHg. Lipid reduction therapy with goal LDL-C <100 mg/dL. Aspirin 81mg  PO QD.   Atorvastatin 40-80mg  PO QD (or other "high intensity" statin therapy).  Will plan for open infrarenal aorto-bi-iliac repair 04/08/20 with me and my partner Dr. 14/1/21. I offered him an earlier appointment, but he wishes to wait until after Thanksgiving. Patient needs cardiac evaluation and risk stratification prior to OR. I counseled him that any sudden worsening of symptoms should prompt presentation to ER.  CHIEF COMPLAINT:   Abdominal aortic aneurysm  HISTORY:  HISTORY OF PRESENT ILLNESS: Justin Poole is a 54 y.o. male who initially presented to [facility], ER for evaluation of musculoskeletal left back pain.  He had a documented history of abdominal aortic aneurysm.  CT a in the emergency department revealed a 5.9 cm infrarenal abdominal aortic aneurysm. He reports his back pain is improved. He reports excellent performance status. He denies exertional angina or dyspnea. He can climb a flight of stairs without difficulty.   Past Medical History:  Diagnosis Date  . AAA (abdominal aortic aneurysm) (HCC)   . Hypertension   . Pancreatitis   . Pancreatitis     Past Surgical History:  Procedure Laterality Date  . HAND SURGERY Left     Family History  Problem Relation Age of Onset  . CAD Mother   . CAD Father   . Diabetes Mellitus II Neg Hx     Social History   Socioeconomic History  . Marital status: Single    Spouse name: Not on file  . Number of children: Not on file  . Years of education: Not on file  . Highest education level: Not on file  Occupational History  . Not on file  Tobacco Use  . Smoking status: Current Every Day Smoker    Packs/day: 1.00    Types: Cigarettes  . Smokeless  tobacco: Never Used  Vaping Use  . Vaping Use: Never used  Substance and Sexual Activity  . Alcohol use: Yes  . Drug use: Not Currently  . Sexual activity: Not Currently  Other Topics Concern  . Not on file  Social History Narrative  . Not on file   Social Determinants of Health    Financial Resource Strain:   . Difficulty of Paying Living Expenses: Not on file  Food Insecurity:   . Worried About Programme researcher, broadcasting/film/video in the Last Year: Not on file  . Ran Out of Food in the Last Year: Not on file  Transportation Needs:   . Lack of Transportation (Medical): Not on file  . Lack of Transportation (Non-Medical): Not on file  Physical Activity:   . Days of Exercise per Week: Not on file  . Minutes of Exercise per Session: Not on file  Stress:   . Feeling of Stress : Not on file  Social Connections:   . Frequency of Communication with Friends and Family: Not on file  . Frequency of Social Gatherings with Friends and Family: Not on file  . Attends Religious Services: Not on file  . Active Member of Clubs or Organizations: Not on file  . Attends Banker Meetings: Not on file  . Marital Status: Not on file  Intimate Partner Violence:   . Fear of Current or Ex-Partner: Not on file  . Emotionally Abused: Not on file  . Physically Abused: Not on file  . Sexually Abused: Not on file    No Known Allergies  Current Outpatient Medications  Medication Sig Dispense Refill  . amLODipine (NORVASC) 5 MG tablet Take 1 tablet (5 mg total) by mouth daily. 30 tablet 1   No current facility-administered medications for this visit.    REVIEW OF SYSTEMS:  [X]  denotes positive finding, [ ]  denotes negative finding Cardiac  Comments:  Chest pain or chest pressure:    Shortness of breath upon exertion:    Short of breath when lying flat:    Irregular heart rhythm:        Vascular    Pain in calf, thigh, or hip brought on by ambulation:    Pain in feet at night that wakes you up from your sleep:     Blood clot in your veins:    Leg swelling:         Pulmonary    Oxygen at home:    Productive cough:     Wheezing:         Neurologic    Sudden weakness in arms or legs:     Sudden numbness in arms or legs:     Sudden onset of difficulty speaking or slurred  speech:    Temporary loss of vision in one eye:     Problems with dizziness:         Gastrointestinal    Blood in stool:     Vomited blood:         Genitourinary    Burning when urinating:     Blood in urine:        Psychiatric    Major depression:         Hematologic    Bleeding problems:    Problems with blood clotting too easily:        Skin    Rashes or ulcers:        Constitutional    Fever or chills:  PHYSICAL EXAM:   Vitals:   03/24/20 1045  BP: 130/89  Pulse: 90  Resp: 20  Temp: 98.7 F (37.1 C)  SpO2: 95%  Weight: 114 lb (51.7 kg)  Height: 5\' 1"  (1.549 m)    Constitutional: well appearing in no distress. Appears well nourished.  Neurologic: normal gait and station. CN intact. no weakness. no sensory loss. Psychiatric: Mood and affect symmetric and appropriate. Eyes: no icterus. no conjunctival pallor. Ears, nose, throat: mucous membranes moist. midline trachea.  Cardiac: regular rate and rhythm.  Respiratory: unlabored. Abdominal: soft, non-tender, non-distended. No scars across the abdomen.  Peripheral vascular:  Femoral pulse: L 2+ / R 2+ Extremity: No edema. No cyanosis. No pallor.  Skin: No gangrene. No ulceration.  Lymphatic: No Stemmer's sign. No palpable lymphadenopathy.  DATA REVIEW:    Most recent CBC CBC Latest Ref Rng & Units 03/16/2020 11/24/2019 06/13/2018  WBC 4.0 - 10.5 K/uL 8.9 15.5(H) 15.3(H)  Hemoglobin 13.0 - 17.0 g/dL 08/12/2018 17.2(H) 13.4  Hematocrit 39 - 52 % 49.9 51.4 41.3  Platelets 150 - 400 K/uL 329 340 254     Most recent CMP CMP Latest Ref Rng & Units 03/16/2020 11/24/2019 06/13/2018  Glucose 70 - 99 mg/dL 08/12/2018) 937(T) 024(O)  BUN 6 - 20 mg/dL 12 8 973(Z)  Creatinine 0.61 - 1.24 mg/dL <3(G 9.92 4.26)  Sodium 135 - 145 mmol/L 138 140 135  Potassium 3.5 - 5.1 mmol/L 3.7 3.9 3.5  Chloride 98 - 111 mmol/L 100 98 100  CO2 22 - 32 mmol/L 28 27 28   Calcium 8.9 - 10.3 mg/dL 9.3 9.6 9.0  Total Protein 6.5 - 8.1 g/dL -  8.3(H) -  Total Bilirubin 0.3 - 1.2 mg/dL - 0.8 -  Alkaline Phos 38 - 126 U/L - 117 -  AST 15 - 41 U/L - 23 -  ALT 0 - 44 U/L - 14 -    Renal function Estimated Creatinine Clearance: 75.4 mL/min (by C-G formula based on SCr of 0.61 mg/dL).  Hgb A1c MFr Bld (%)  Date Value  06/08/2018 5.6    No results found for: LDLCALC, LDLC, HIRISKLDL, POCLDL, LDLDIRECT, REALLDLC, TOTLDLC   Vascular Imaging: CTA personally reviewed 15mm infrarenal aneurysm with suitable infrarenal neck for clamp site Common iliac arteries appear relatively spared of atherosclerosis and appear suitable for distal clamp sites   06/10/2018. 47m, MD [facility] Phone Number: [phone] 03/24/2020 11:14 AM

## 2020-03-26 ENCOUNTER — Encounter: Payer: Self-pay | Admitting: Cardiology

## 2020-03-26 NOTE — Progress Notes (Signed)
Arloa Koh, MD Reason for referral-preoperative evaluation prior to abdominal aortic aneurysm repair  HPI: 54 year old male for preoperative evaluation prior to abdominal aortic aneurysm repair at request of Heath Lark, MD.  November 2020 showed infrarenal abdominal aortic aneurysm measuring 59 mm.  Aortic atherosclerosis noted.  Patient has a long tobacco history.  He notes dyspnea with walking 200 yards.  He denies orthopnea, PND or pedal edema.  He occasionally has a sharp pain in his epigastric area lasting seconds.  It does not radiate and there are no associated symptoms.  It is not exertional.  Resolve spontaneously.  He denies claudication.  Cardiology now asked to evaluate.  Current Outpatient Medications  Medication Sig Dispense Refill  . amLODipine (NORVASC) 5 MG tablet Take 1 tablet (5 mg total) by mouth daily. 30 tablet 1   No current facility-administered medications for this visit.    No Known Allergies   Past Medical History:  Diagnosis Date  . AAA (abdominal aortic aneurysm) (HCC)   . Asthma    as a child  . Dysrhythmia    pt states they have told him he "has an irregular heart beat"  . GERD (gastroesophageal reflux disease)   . Hypertension   . Pancreatitis    hospitalization 06/2018 for acute pancreatitis, likely relatead to alcohol use    Past Surgical History:  Procedure Laterality Date  . HAND SURGERY Left     Social History   Socioeconomic History  . Marital status: Single    Spouse name: Not on file  . Number of children: Not on file  . Years of education: Not on file  . Highest education level: Not on file  Occupational History  . Not on file  Tobacco Use  . Smoking status: Current Every Day Smoker    Packs/day: 1.00    Years: 45.00    Pack years: 45.00    Types: Cigarettes  . Smokeless tobacco: Never Used  Vaping Use  . Vaping Use: Never used  Substance and Sexual Activity  . Alcohol use: Yes    Comment: Pint a week   . Drug use: Not Currently  . Sexual activity: Not Currently  Other Topics Concern  . Not on file  Social History Narrative  . Not on file   Social Determinants of Health   Financial Resource Strain:   . Difficulty of Paying Living Expenses: Not on file  Food Insecurity:   . Worried About Programme researcher, broadcasting/film/video in the Last Year: Not on file  . Ran Out of Food in the Last Year: Not on file  Transportation Needs:   . Lack of Transportation (Medical): Not on file  . Lack of Transportation (Non-Medical): Not on file  Physical Activity:   . Days of Exercise per Week: Not on file  . Minutes of Exercise per Session: Not on file  Stress:   . Feeling of Stress : Not on file  Social Connections:   . Frequency of Communication with Friends and Family: Not on file  . Frequency of Social Gatherings with Friends and Family: Not on file  . Attends Religious Services: Not on file  . Active Member of Clubs or Organizations: Not on file  . Attends Banker Meetings: Not on file  . Marital Status: Not on file  Intimate Partner Violence:   . Fear of Current or Ex-Partner: Not on file  . Emotionally Abused: Not on file  . Physically Abused: Not on file  .  Sexually Abused: Not on file    Family History  Problem Relation Age of Onset  . CAD Mother        Died of MI 47s  . CAD Father        Died of MI 29s  . Diabetes Mellitus II Neg Hx     ROS: no fevers or chills, productive cough, hemoptysis, dysphasia, odynophagia, melena, hematochezia, dysuria, hematuria, rash, seizure activity, orthopnea, PND, pedal edema, claudication. Remaining systems are negative.  Physical Exam:   Blood pressure 122/74, pulse 88, height 5\' 1"  (1.549 m), weight 118 lb (53.5 kg).  General:  Well developed/well nourished in NAD Skin warm/dry Patient not depressed No peripheral clubbing Back-normal HEENT-normal/normal eyelids Neck supple/normal carotid upstroke bilaterally; no bruits; no JVD; no  thyromegaly chest -diffuse expiratory wheeze. CV - RRR/normal S1 and S2; no murmurs, rubs or gallops;  PMI nondisplaced Abdomen -NT/ND, no HSM, no mass, + bowel sounds, no bruit 2+ femoral pulses, no bruits Ext-no edema, chords, 2+ DP Neuro-grossly nonfocal  ECG -March 31, 2020-sinus rhythm, left bundle branch block.  Personally reviewed  A/P  1 preoperative evaluation prior to repair of abdominal aortic aneurysm-patient has multiple risk factors including tobacco abuse, strong family history of coronary artery disease and hypertension.  He also has documented vascular disease with abdominal aortic aneurysm.  He has occasional atypical chest pain.  His electrocardiogram shows a left bundle branch block.  I feel the patient requires further risk stratification prior to surgery.  We will arrange a Lexiscan nuclear study for risk stratification.  If no ischemia he may proceed.  2 hypertension-patient's blood pressure is controlled.  Continue present medications and follow.  3 abdominal aortic aneurysm-management per vascular surgery.  We will add Crestor 40 mg daily.  Check lipids and liver in 12 weeks.  4 tobacco abuse-patient counseled on discontinuing.  April 02, 2020, MD

## 2020-03-30 NOTE — Progress Notes (Signed)
[facility] [address] [place], [place] - [address] [place] [address] Phone: [phone] Fax: [phone]      Your procedure is scheduled on Wednesday, December 1st .  Report to [facility] Main Entrance "A" at 6:30 A.M., and check in at the Admitting office.  Call this number if you have problems the morning of surgery:  [phone]  Call [phone] if you have any questions prior to your surgery date Monday-Friday 8am-4pm    Remember:  Do not eat or drink after midnight the night before your surgery     Take these medicines the morning of surgery with A SIP OF WATER   Amlodipine (Norvasc)  As of today, STOP taking any Aspirin (unless otherwise instructed by your surgeon) Aleve, Naproxen, Ibuprofen, Motrin, Advil, Goody's, BC's, all herbal medications, fish oil, and all vitamins.                      Do not wear jewelry.            Do not wear lotions, powders, colognes, or deodorant.            Men may shave face and neck.            Do not bring valuables to the hospital.            [facility] is not responsible for any belongings or valuables.  Do NOT Smoke (Tobacco/Vaping) or drink Alcohol 24 hours prior to your procedure If you use a CPAP at night, you may bring all equipment for your overnight stay.   Contacts, glasses, dentures or bridgework may not be worn into surgery.      For patients admitted to the hospital, discharge time will be determined by your treatment team.   Patients discharged the day of surgery will not be allowed to drive home, and someone needs to stay with them for 24 hours.    Special instructions:   [facility]- Preparing For Surgery  Before surgery, you can play an important role. Because skin is not sterile, your skin needs to be as free of germs as possible. You can reduce the number of germs on your skin by washing with CHG (chlorahexidine gluconate) Soap before surgery.  CHG is an  antiseptic cleaner which kills germs and bonds with the skin to continue killing germs even after washing.    Oral Hygiene is also important to reduce your risk of infection.  Remember - BRUSH YOUR TEETH THE MORNING OF SURGERY WITH YOUR REGULAR TOOTHPASTE  Please do not use if you have an allergy to CHG or antibacterial soaps. If your skin becomes reddened/irritated stop using the CHG.  Do not shave (including legs and underarms) for at least 48 hours prior to first CHG shower. It is OK to shave your face.  Please follow these instructions carefully.   1. Shower the NIGHT BEFORE SURGERY and the MORNING OF SURGERY with CHG Soap.   2. If you chose to wash your hair, wash your hair first as usual with your normal shampoo.  3. After you shampoo, rinse your hair and body thoroughly to remove the shampoo.  4. Use CHG as you would any other liquid soap. You can apply CHG directly to the skin and wash gently with a scrungie or a clean washcloth.   5. Apply the CHG Soap to your body ONLY FROM THE NECK DOWN.  Do not use on open  wounds or open sores. Avoid contact with your eyes, ears, mouth and genitals (private parts). Wash Face and genitals (private parts)  with your normal soap.   6. Wash thoroughly, paying special attention to the area where your surgery will be performed.  7. Thoroughly rinse your body with warm water from the neck down.  8. DO NOT shower/wash with your normal soap after using and rinsing off the CHG Soap.  9. Pat yourself dry with a CLEAN TOWEL.  10. Wear CLEAN PAJAMAS to bed the night before surgery  11. Place CLEAN SHEETS on your bed the night of your first shower and DO NOT SLEEP WITH PETS.   Day of Surgery: Wear Clean/Comfortable clothing the morning of surgery Do not apply any deodorants/lotions.   Remember to brush your teeth WITH YOUR REGULAR TOOTHPASTE.   Please read over the following fact sheets that you were given.

## 2020-03-31 ENCOUNTER — Encounter (HOSPITAL_COMMUNITY): Payer: Self-pay | Admitting: Vascular Surgery

## 2020-03-31 ENCOUNTER — Encounter (HOSPITAL_COMMUNITY): Payer: Self-pay | Admitting: Anesthesiology

## 2020-03-31 ENCOUNTER — Encounter (HOSPITAL_COMMUNITY): Payer: Self-pay

## 2020-03-31 ENCOUNTER — Encounter (HOSPITAL_COMMUNITY)
Admission: RE | Admit: 2020-03-31 | Discharge: 2020-03-31 | Disposition: A | Discharge status: Home / Self Care | Payer: Self-pay | Source: Ambulatory Visit | Attending: Vascular Surgery | Admitting: Vascular Surgery

## 2020-03-31 ENCOUNTER — Other Ambulatory Visit: Payer: Self-pay

## 2020-03-31 DIAGNOSIS — Z01818 Encounter for other preprocedural examination: Secondary | ICD-10-CM | POA: Insufficient documentation

## 2020-03-31 DIAGNOSIS — F17210 Nicotine dependence, cigarettes, uncomplicated: Secondary | ICD-10-CM | POA: Insufficient documentation

## 2020-03-31 DIAGNOSIS — I10 Essential (primary) hypertension: Secondary | ICD-10-CM | POA: Insufficient documentation

## 2020-03-31 DIAGNOSIS — I714 Abdominal aortic aneurysm, without rupture: Secondary | ICD-10-CM | POA: Insufficient documentation

## 2020-03-31 DIAGNOSIS — Z7901 Long term (current) use of anticoagulants: Secondary | ICD-10-CM | POA: Insufficient documentation

## 2020-03-31 HISTORY — DX: Unspecified asthma, uncomplicated: J45.909

## 2020-03-31 HISTORY — DX: Gastro-esophageal reflux disease without esophagitis: K21.9

## 2020-03-31 HISTORY — DX: Cardiac arrhythmia, unspecified: I49.9

## 2020-03-31 LAB — CBC
HCT: 53.1 % — ABNORMAL HIGH (ref 39.0–52.0)
Hemoglobin: 17.3 g/dL — ABNORMAL HIGH (ref 13.0–17.0)
MCH: 31.1 pg (ref 26.0–34.0)
MCHC: 32.6 g/dL (ref 30.0–36.0)
MCV: 95.5 fL (ref 80.0–100.0)
Platelets: 316 10*3/uL (ref 150–400)
RBC: 5.56 MIL/uL (ref 4.22–5.81)
RDW: 13.7 % (ref 11.5–15.5)
WBC: 11 10*3/uL — ABNORMAL HIGH (ref 4.0–10.5)
nRBC: 0 % (ref 0.0–0.2)

## 2020-03-31 LAB — SURGICAL PCR SCREEN
MRSA, PCR: NEGATIVE
Staphylococcus aureus: NEGATIVE

## 2020-03-31 LAB — COMPREHENSIVE METABOLIC PANEL
ALT: 17 U/L (ref 0–44)
AST: 34 U/L (ref 15–41)
Albumin: 4.5 g/dL (ref 3.5–5.0)
Alkaline Phosphatase: 120 U/L (ref 38–126)
Anion gap: 14 (ref 5–15)
BUN: 5 mg/dL — ABNORMAL LOW (ref 6–20)
CO2: 24 mmol/L (ref 22–32)
Calcium: 9.7 mg/dL (ref 8.9–10.3)
Chloride: 100 mmol/L (ref 98–111)
Creatinine, Ser: 0.78 mg/dL (ref 0.61–1.24)
GFR, Estimated: >60 mL/min (ref >60)
Glucose, Bld: 113 mg/dL — ABNORMAL HIGH (ref 70–99)
Potassium: 3.6 mmol/L (ref 3.5–5.1)
Sodium: 138 mmol/L (ref 135–145)
Total Bilirubin: 0.5 mg/dL (ref 0.3–1.2)
Total Protein: 8 g/dL (ref 6.5–8.1)

## 2020-03-31 LAB — URINALYSIS, ROUTINE W REFLEX MICROSCOPIC
Bilirubin Urine: NEGATIVE
Glucose, UA: NEGATIVE mg/dL
Hgb urine dipstick: NEGATIVE
Ketones, ur: NEGATIVE mg/dL
Leukocytes,Ua: NEGATIVE
Nitrite: NEGATIVE
Protein, ur: 30 mg/dL — AB
Specific Gravity, Urine: 1.019 (ref 1.005–1.030)
pH: 5 (ref 5.0–8.0)

## 2020-03-31 LAB — TYPE AND SCREEN
ABO/RH(D): O POS
Antibody Screen: NEGATIVE

## 2020-03-31 LAB — APTT: aPTT: 31 seconds (ref 24–36)

## 2020-03-31 LAB — PROTIME-INR
INR: 1 (ref 0.8–1.2)
Prothrombin Time: 12.5 seconds (ref 11.4–15.2)

## 2020-03-31 NOTE — Progress Notes (Addendum)
PCP - none per pt Cardiologist - Olga Millers (appt for cardiac clearance made for 04/07/2020)  PPM/ICD - denies   Chest x-ray - 06/09/2018 (abnormal) EKG - 03/31/2020 Stress Test - denies ECHO - denies Cardiac Cath - denies  Sleep Study - denies  Patient instructed to hold all Aspirin, NSAID's, herbal medications, fish oil and vitamins 7 days prior to surgery.  ERAS Protcol -no   COVID TEST- 04/07/2020 1345   Anesthesia review: yes, appt for cardiac clearance made for 04/07/2020 by [facility]. EKG during PAT appt also revealed bundle branch block.  Patient denies shortness of breath, fever, cough and chest pain at PAT appointment   All instructions explained to the patient, with a verbal understanding of the material. Patient agrees to go over the instructions while at home for a better understanding. Patient also instructed to self quarantine after being tested for COVID-19. The opportunity to ask questions was provided.

## 2020-04-01 ENCOUNTER — Other Ambulatory Visit: Payer: Self-pay

## 2020-04-01 ENCOUNTER — Encounter (HOSPITAL_COMMUNITY): Payer: Self-pay

## 2020-04-01 DIAGNOSIS — I714 Abdominal aortic aneurysm, without rupture, unspecified: Secondary | ICD-10-CM

## 2020-04-01 NOTE — Progress Notes (Signed)
Anesthesia Chart Review:  Case: [id number] Date/Time: 04/08/20 0814   Procedure: OPEN ABDOMINAL AORTIC ANEURYSM REPAIR (N/A )   Anesthesia type: General   Pre-op diagnosis: AAA   Location: [facility] OR ROOM 12 / [facility] OR   Surgeons: Leonie Douglas, MD      DISCUSSION: Patient is a 54 year old male scheduled for the above procedure. He was found to have a AAA during 06/2018 hospitalization for pancreatitis. Vascular surgery out-patient evaluation was recommended, but he was lost to follow-up until ED evaluation for back pain on 03/16/20. Vascular surgeon was contacted and follow-up scheduled when CTA showed AAA 59 mm (increased from 48 mm on 06/08/18). Dr. Lenell Antu discussed open repair versus EVAR (if appropriate anatomy), and Mr. Amon desired open repair.   History includes smoking, AAA, dysrhythmia ("irregular heart beat" without known diagnosis of afib), HTN, GERD, childhood asthma, pancreatitis (~ 2019; last 06/2018, likely due to alcohol use). Alcohol use is documented as 1/5 liquor per week.  Patient is scheduled for preoperative cardiac evaluation by Olga Millers, MD on 04/07/2020.  Preoperative EKG showed left bundle branch block. There are no other comparison EKG in [facility] or [facility] (and unsuccessful search for [facility] records through Care Everywhere). He denied prior stress test, echo, cardiac cath.   He was started on amlodipine 5 mg daily by ED provider on 03/16/20 for BP 160/117 with known AAA. BP 159/96 at PAT.   Presurgical COVID-19 test is scheduled for 04/07/20. Chart will be left for follow-up cardiology input.    VS: BP (!) 159/96   Pulse 100   Temp 36.4 C (Oral)   Resp 18   Ht 5\' 1"  (1.549 m)   Wt 52.4 kg   SpO2 98%   BMI 21.84 kg/m    PROVIDERS: Patient, No Pcp Per   LABS: Preoperative labs noted. H/H 17.3/53.1 (consistent with labs since 11/24/19). + Smoker. Does not have a PCP.  (all labs ordered are listed, but only abnormal results are displayed)  Labs Reviewed  CBC -  Abnormal; Notable for the following components:      Result Value   WBC 11.0 (*)    Hemoglobin 17.3 (*)    HCT 53.1 (*)    All other components within normal limits  COMPREHENSIVE METABOLIC PANEL - Abnormal; Notable for the following components:   Glucose, Bld 113 (*)    BUN 5 (*)    All other components within normal limits  URINALYSIS, ROUTINE W REFLEX MICROSCOPIC - Abnormal; Notable for the following components:   Protein, ur 30 (*)    Bacteria, UA RARE (*)    All other components within normal limits  SURGICAL PCR SCREEN  APTT  PROTIME-INR  TYPE AND SCREEN     IMAGES: CTA abd/pelvis 03/16/20: IMPRESSION: VASCULAR 1. Continued enlargement of infrarenal abdominal aortic aneurysm now measuring up to 59 mm, previously 54 mm. Recommend referral to a vascular specialist. This recommendation follows ACR consensus guidelines: White Paper of the ACR Incidental Findings Committee II on Vascular Findings. J Am Coll Radiol 2013; 10:789-794. 2. 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 3.  Aortic Atherosclerosis (ICD10-I70.0). NON-VASCULAR No acute or significant abdominopelvic abnormality.   EKG: 03/31/20: Normal sinus rhythm Left bundle branch block Abnormal ECG   CV: Denied prior stress, echo, cath. For cardiology preoperative evaluation 04/07/20.    Past Medical History:  Diagnosis Date  . AAA (abdominal aortic aneurysm) (HCC)   . Asthma  as a child  . Dysrhythmia    pt states they have told him he "has an irregular heart beat"  . GERD (gastroesophageal reflux disease)   . Hypertension   . Pancreatitis     Past Surgical History:  Procedure Laterality Date  . HAND SURGERY Left     MEDICATIONS: . amLODipine (NORVASC) 5 MG tablet   No current facility-administered medications for this encounter.    Shonna Chock, PA-C Surgical Short Stay/Anesthesiology [facility] Phone [phone] [facility] Phone [phone] 04/01/2020 5:50 PM

## 2020-04-07 ENCOUNTER — Other Ambulatory Visit (HOSPITAL_COMMUNITY): Payer: Self-pay

## 2020-04-07 ENCOUNTER — Encounter: Payer: Self-pay | Admitting: Cardiology

## 2020-04-07 ENCOUNTER — Telehealth: Payer: Self-pay

## 2020-04-07 ENCOUNTER — Ambulatory Visit (INDEPENDENT_AMBULATORY_CARE_PROVIDER_SITE_OTHER): Payer: Self-pay | Admitting: Cardiology

## 2020-04-07 ENCOUNTER — Telehealth (HOSPITAL_COMMUNITY): Payer: Self-pay

## 2020-04-07 ENCOUNTER — Other Ambulatory Visit: Payer: Self-pay

## 2020-04-07 VITALS — BP 122/74 | HR 88 | Ht 61.0 in | Wt 118.0 lb

## 2020-04-07 DIAGNOSIS — Z01818 Encounter for other preprocedural examination: Secondary | ICD-10-CM

## 2020-04-07 DIAGNOSIS — E785 Hyperlipidemia, unspecified: Secondary | ICD-10-CM

## 2020-04-07 MED ORDER — AMLODIPINE BESYLATE 5 MG PO TABS
5.0000 mg | ORAL_TABLET | Freq: Every day | ORAL | 1 refills | Status: DC
Start: 2020-04-07 — End: 2020-07-31

## 2020-04-07 MED ORDER — ROSUVASTATIN CALCIUM 40 MG PO TABS: 40.0000 mg | ORAL_TABLET | Freq: Every day | ORAL | 3 refills | Status: DC

## 2020-04-07 NOTE — Patient Instructions (Signed)
Medication Instructions:   START ROSUVASTATIN 40 MG ONCE DAILY  *If you need a refill on your cardiac medications before your next appointment, please call your pharmacy*   Lab Work:  Your physician recommends that you return for lab work in: 12 WEEKS FASTING  If you have labs (blood work) drawn today and your tests are completely normal, you will receive your results only by: Marland Kitchen MyChart Message (if you have MyChart) OR . A paper copy in the mail If you have any lab test that is abnormal or we need to change your treatment, we will call you to review the results.   Testing/Procedures:  Your physician has requested that you have a lexiscan myoview. For further information please visit [email or web address] Please follow instruction sheet, as given.[address]     Follow-Up: At [facility], you and your health needs are our priority.  As part of our continuing mission to provide you with exceptional heart care, we have created designated Provider Care Teams.  These Care Teams include your primary Cardiologist (physician) and Advanced Practice Providers (APPs -  Physician Assistants and Nurse Practitioners) who all work together to provide you with the care you need, when you need it.  We recommend signing up for the patient portal called "MyChart".  Sign up information is provided on this After Visit Summary.  MyChart is used to connect with patients for Virtual Visits (Telemedicine).  Patients are able to view lab/test results, encounter notes, upcoming appointments, etc.  Non-urgent messages can be sent to your provider as well.   To learn more about what you can do with MyChart, go to [email or web address].    Your next appointment:   3 month(s)  The format for your next appointment:   In Person  Provider:   Olga Millers, MD

## 2020-04-07 NOTE — Telephone Encounter (Signed)
Telephone call received from Dr. Ludwig Clarks office advising cancellation of patient's AAA repair surgery on tomorrow with Dr. Lenell Antu due to he needs to be scheduled for a stress test. This has been arranged on 04/09/20. Dr. Lenell Antu made aware.  Spoke with patient to confirm aware of surgery cancellation. Pt verbalized understanding. Pt stated he will have sister Justin Poole call office to reschedule surgery possibly in January.

## 2020-04-07 NOTE — Telephone Encounter (Signed)
Spoke with the patient, detailed instructions given. He stated that he would be here for his test. Asked to call back with any questions. S.Tevon Berhane EMTP 

## 2020-04-08 ENCOUNTER — Inpatient Hospital Stay (HOSPITAL_COMMUNITY): Admission: RE | Admit: 2020-04-08 | Payer: Self-pay | Source: Ambulatory Visit

## 2020-04-08 ENCOUNTER — Other Ambulatory Visit (HOSPITAL_COMMUNITY): Payer: Self-pay

## 2020-04-08 ENCOUNTER — Encounter (HOSPITAL_COMMUNITY): Admission: RE | Payer: Self-pay | Source: Ambulatory Visit

## 2020-04-08 SURGERY — ANEURYSM ABDOMINAL AORTIC REPAIR
Anesthesia: General

## 2020-04-09 ENCOUNTER — Ambulatory Visit (HOSPITAL_COMMUNITY): Payer: Self-pay | Attending: Cardiology

## 2020-04-09 ENCOUNTER — Other Ambulatory Visit: Payer: Self-pay

## 2020-04-09 DIAGNOSIS — Z01818 Encounter for other preprocedural examination: Secondary | ICD-10-CM | POA: Insufficient documentation

## 2020-04-09 LAB — LIPID PANEL
Chol/HDL Ratio: 3.1 ratio (ref 0.0–5.0)
Cholesterol, Total: 218 mg/dL — ABNORMAL HIGH (ref 100–199)
HDL: 70 mg/dL (ref >39)
LDL Chol Calc (NIH): 101 mg/dL — ABNORMAL HIGH (ref 0–99)
Triglycerides: 284 mg/dL — ABNORMAL HIGH (ref 0–149)
VLDL Cholesterol Cal: 47 mg/dL — ABNORMAL HIGH (ref 5–40)

## 2020-04-09 LAB — HEPATIC FUNCTION PANEL
ALT: 12 IU/L (ref 0–44)
AST: 23 IU/L (ref 0–40)
Albumin: 4.8 g/dL (ref 3.8–4.9)
Alkaline Phosphatase: 138 IU/L — ABNORMAL HIGH (ref 44–121)
Bilirubin Total: 0.3 mg/dL (ref 0.0–1.2)
Bilirubin, Direct: 0.1 mg/dL (ref 0.00–0.40)
Total Protein: 7.5 g/dL (ref 6.0–8.5)

## 2020-04-09 LAB — MYOCARDIAL PERFUSION IMAGING
LV dias vol: 80 mL (ref 62–150)
LV sys vol: 40 mL
Peak HR: 121 {beats}/min
Rest HR: 96 {beats}/min
SDS: 5
SRS: 3
SSS: 8
TID: 0.92

## 2020-04-09 MED ORDER — REGADENOSON 0.4 MG/5ML IV SOLN
0.4000 mg | Freq: Once | INTRAVENOUS | Status: AC
  Administered 2020-04-09: 0.4 mg via INTRAVENOUS

## 2020-04-09 MED ORDER — TECHNETIUM TC 99M TETROFOSMIN IV KIT
31.7000 | PACK | Freq: Once | INTRAVENOUS | Status: AC | PRN
  Administered 2020-04-09: 31.7 via INTRAVENOUS
  Filled 2020-04-09: qty 32

## 2020-04-09 MED ORDER — TECHNETIUM TC 99M TETROFOSMIN IV KIT
10.8000 | PACK | Freq: Once | INTRAVENOUS | Status: AC | PRN
  Administered 2020-04-09: 10.8 via INTRAVENOUS
  Filled 2020-04-09: qty 11

## 2020-04-10 ENCOUNTER — Other Ambulatory Visit: Payer: Self-pay | Admitting: *Deleted

## 2020-04-10 DIAGNOSIS — E785 Hyperlipidemia, unspecified: Secondary | ICD-10-CM

## 2020-04-14 ENCOUNTER — Other Ambulatory Visit: Payer: Self-pay | Admitting: *Deleted

## 2020-04-14 DIAGNOSIS — R9439 Abnormal result of other cardiovascular function study: Secondary | ICD-10-CM

## 2020-04-16 ENCOUNTER — Other Ambulatory Visit: Payer: Self-pay

## 2020-04-16 ENCOUNTER — Ambulatory Visit (HOSPITAL_COMMUNITY)
Admission: RE | Admit: 2020-04-16 | Discharge: 2020-04-16 | Disposition: A | Discharge status: Home / Self Care | Payer: Self-pay | Source: Ambulatory Visit | Attending: Cardiology | Admitting: Cardiology

## 2020-04-16 DIAGNOSIS — F17200 Nicotine dependence, unspecified, uncomplicated: Secondary | ICD-10-CM | POA: Insufficient documentation

## 2020-04-16 DIAGNOSIS — R9439 Abnormal result of other cardiovascular function study: Secondary | ICD-10-CM | POA: Insufficient documentation

## 2020-04-16 DIAGNOSIS — I10 Essential (primary) hypertension: Secondary | ICD-10-CM | POA: Insufficient documentation

## 2020-04-16 DIAGNOSIS — I714 Abdominal aortic aneurysm, without rupture: Secondary | ICD-10-CM | POA: Insufficient documentation

## 2020-04-16 DIAGNOSIS — I351 Nonrheumatic aortic (valve) insufficiency: Secondary | ICD-10-CM | POA: Insufficient documentation

## 2020-04-16 LAB — ECHOCARDIOGRAM COMPLETE
Area-P 1/2: 3.91 cm2
Calc EF: 45.1 %
S' Lateral: 3.4 cm
Single Plane A2C EF: 50.9 %
Single Plane A4C EF: 39 %

## 2020-04-16 NOTE — Progress Notes (Signed)
  Echocardiogram 2D Echocardiogram has been performed.  Delcie Roch 04/16/2020, 9:50 AM

## 2020-04-29 ENCOUNTER — Other Ambulatory Visit: Payer: Self-pay

## 2020-05-12 ENCOUNTER — Encounter: Payer: Self-pay | Admitting: Vascular Surgery

## 2020-05-12 ENCOUNTER — Ambulatory Visit
Admission: RE | Admit: 2020-05-12 | Discharge: 2020-05-12 | Disposition: A | Discharge status: Home / Self Care | Payer: No Typology Code available for payment source | Source: Ambulatory Visit | Attending: Vascular Surgery | Admitting: Vascular Surgery

## 2020-05-12 ENCOUNTER — Other Ambulatory Visit: Payer: Self-pay

## 2020-05-12 DIAGNOSIS — I714 Abdominal aortic aneurysm, without rupture, unspecified: Secondary | ICD-10-CM

## 2020-05-12 MED ORDER — IOPAMIDOL (ISOVUE-370) INJECTION 76%
75.0000 mL | Freq: Once | INTRAVENOUS | Status: AC | PRN
  Administered 2020-05-12: 75 mL via INTRAVENOUS

## 2020-05-15 NOTE — Progress Notes (Signed)
[facility] [address] [place], [place] - [address] [place] [address] Phone: [phone] Fax: [phone]  [facility] [address] [place] - [address] [place] [address] Phone: [phone] Fax: [phone]      Your procedure is scheduled on January 14  Report to [facility] Main Entrance "A" at 0530 A.M., and check in at the Admitting office.  Call this number if you have problems the morning of surgery:  [phone]  Call [phone] if you have any questions prior to your surgery date Monday-Friday 8am-4pm    Remember:  Do not eat or drink after midnight the night before your surgery    Take these medicines the morning of surgery with A SIP OF WATER  amLODipine (NORVASC) rosuvastatin (CRESTOR)  As of today, STOP taking any Aspirin (unless otherwise instructed by your surgeon) Aleve, Naproxen, Ibuprofen, Motrin, Advil, Goody's, BC's, all herbal medications, fish oil, and all vitamins.                      Do not wear jewelry            Do not wear lotions, powders, colognes, or deodorant.             Men may shave face and neck.            Do not bring valuables to the hospital.            [facility] is not responsible for any belongings or valuables.  Do NOT Smoke (Tobacco/Vaping) or drink Alcohol 24 hours prior to your procedure If you use a CPAP at night, you may bring all equipment for your overnight stay.   Contacts, glasses, dentures or bridgework may not be worn into surgery.      For patients admitted to the hospital, discharge time will be determined by your treatment team.   Patients discharged the day of surgery will not be allowed to drive home, and someone needs to stay with them for 24 hours.    Special instructions:   [facility]- Preparing For Surgery  Before surgery, you can play an important role. Because skin is not sterile, your skin needs to  be as free of germs as possible. You can reduce the number of germs on your skin by washing with CHG (chlorahexidine gluconate) Soap before surgery.  CHG is an antiseptic cleaner which kills germs and bonds with the skin to continue killing germs even after washing.    Oral Hygiene is also important to reduce your risk of infection.  Remember - BRUSH YOUR TEETH THE MORNING OF SURGERY WITH YOUR REGULAR TOOTHPASTE  Please do not use if you have an allergy to CHG or antibacterial soaps. If your skin becomes reddened/irritated stop using the CHG.  Do not shave (including legs and underarms) for at least 48 hours prior to first CHG shower. It is OK to shave your face.  Please follow these instructions carefully.   1. Shower the NIGHT BEFORE SURGERY and the MORNING OF SURGERY with CHG Soap.   2. If you chose to wash your hair, wash your hair first as usual with your normal shampoo.  3. After you shampoo, rinse your hair and body thoroughly to remove the shampoo.  4. Use CHG as you would any other liquid soap. You can apply CHG directly to the skin and wash gently with a scrungie or a clean  washcloth.   5. Apply the CHG Soap to your body ONLY FROM THE NECK DOWN.  Do not use on open wounds or open sores. Avoid contact with your eyes, ears, mouth and genitals (private parts). Wash Face and genitals (private parts)  with your normal soap.   6. Wash thoroughly, paying special attention to the area where your surgery will be performed.  7. Thoroughly rinse your body with warm water from the neck down.  8. DO NOT shower/wash with your normal soap after using and rinsing off the CHG Soap.  9. Pat yourself dry with a CLEAN TOWEL.  10. Wear CLEAN PAJAMAS to bed the night before surgery  11. Place CLEAN SHEETS on your bed the night of your first shower and DO NOT SLEEP WITH PETS.   Day of Surgery: Wear Clean/Comfortable clothing the morning of surgery Do not apply any deodorants/lotions.   Remember  to brush your teeth WITH YOUR REGULAR TOOTHPASTE.   Please read over the following fact sheets that you were given.

## 2020-05-18 ENCOUNTER — Encounter (HOSPITAL_COMMUNITY)
Admission: RE | Admit: 2020-05-18 | Discharge: 2020-05-18 | Disposition: A | Discharge status: Home / Self Care | Payer: No Typology Code available for payment source | Source: Ambulatory Visit | Attending: Vascular Surgery | Admitting: Vascular Surgery

## 2020-05-18 ENCOUNTER — Encounter (HOSPITAL_COMMUNITY): Payer: Self-pay

## 2020-05-18 ENCOUNTER — Other Ambulatory Visit: Payer: Self-pay

## 2020-05-18 DIAGNOSIS — Z79899 Other long term (current) drug therapy: Secondary | ICD-10-CM | POA: Insufficient documentation

## 2020-05-18 DIAGNOSIS — I447 Left bundle-branch block, unspecified: Secondary | ICD-10-CM | POA: Insufficient documentation

## 2020-05-18 DIAGNOSIS — Z01812 Encounter for preprocedural laboratory examination: Secondary | ICD-10-CM | POA: Insufficient documentation

## 2020-05-18 DIAGNOSIS — K219 Gastro-esophageal reflux disease without esophagitis: Secondary | ICD-10-CM | POA: Insufficient documentation

## 2020-05-18 DIAGNOSIS — I10 Essential (primary) hypertension: Secondary | ICD-10-CM | POA: Insufficient documentation

## 2020-05-18 DIAGNOSIS — I714 Abdominal aortic aneurysm, without rupture: Secondary | ICD-10-CM | POA: Insufficient documentation

## 2020-05-18 HISTORY — DX: Left bundle-branch block, unspecified: I44.7

## 2020-05-18 LAB — COMPREHENSIVE METABOLIC PANEL
ALT: 29 U/L (ref 0–44)
AST: 29 U/L (ref 15–41)
Albumin: 4 g/dL (ref 3.5–5.0)
Alkaline Phosphatase: 124 U/L (ref 38–126)
Anion gap: 10 (ref 5–15)
BUN: 10 mg/dL (ref 6–20)
CO2: 29 mmol/L (ref 22–32)
Calcium: 10.3 mg/dL (ref 8.9–10.3)
Chloride: 98 mmol/L (ref 98–111)
Creatinine, Ser: 0.65 mg/dL (ref 0.61–1.24)
GFR, Estimated: >60 mL/min (ref >60)
Glucose, Bld: 99 mg/dL (ref 70–99)
Potassium: 4 mmol/L (ref 3.5–5.1)
Sodium: 137 mmol/L (ref 135–145)
Total Bilirubin: 0.5 mg/dL (ref 0.3–1.2)
Total Protein: 7.4 g/dL (ref 6.5–8.1)

## 2020-05-18 LAB — URINALYSIS, ROUTINE W REFLEX MICROSCOPIC
Bilirubin Urine: NEGATIVE
Glucose, UA: NEGATIVE mg/dL
Hgb urine dipstick: NEGATIVE
Ketones, ur: NEGATIVE mg/dL
Leukocytes,Ua: NEGATIVE
Nitrite: NEGATIVE
Protein, ur: NEGATIVE mg/dL
Specific Gravity, Urine: 1.016 (ref 1.005–1.030)
pH: 5 (ref 5.0–8.0)

## 2020-05-18 LAB — CBC
HCT: 48.7 % (ref 39.0–52.0)
Hemoglobin: 15.8 g/dL (ref 13.0–17.0)
MCH: 31.5 pg (ref 26.0–34.0)
MCHC: 32.4 g/dL (ref 30.0–36.0)
MCV: 97 fL (ref 80.0–100.0)
Platelets: 220 10*3/uL (ref 150–400)
RBC: 5.02 MIL/uL (ref 4.22–5.81)
RDW: 14.2 % (ref 11.5–15.5)
WBC: 8.9 10*3/uL (ref 4.0–10.5)
nRBC: 0 % (ref 0.0–0.2)

## 2020-05-18 LAB — PROTIME-INR
INR: 0.9 (ref 0.8–1.2)
Prothrombin Time: 12.1 seconds (ref 11.4–15.2)

## 2020-05-18 LAB — APTT: aPTT: 32 seconds (ref 24–36)

## 2020-05-18 LAB — SURGICAL PCR SCREEN
MRSA, PCR: NEGATIVE
Staphylococcus aureus: NEGATIVE

## 2020-05-18 NOTE — Progress Notes (Signed)
PCP - denies Cardiologist - Dr. Jens Som  Chest x-ray - n/a EKG - 03/31/20 Stress Test - 12/21 ECHO - 12/21 Cardiac Cath - denies   Blood Thinner Instructions: n/a Aspirin Instructions: n/a  COVID TEST- 05/21/20   Anesthesia review: EKG   Patient denies shortness of breath, fever, cough and chest pain at PAT appointment   All instructions explained to the patient, with a verbal understanding of the material. Patient agrees to go over the instructions while at home for a better understanding. Patient also instructed to self quarantine after being tested for COVID-19. The opportunity to ask questions was provided.

## 2020-05-18 NOTE — Progress Notes (Signed)
Your procedure is scheduled on May 22, 2020  Report to [facility] Main Entrance "A" at 05:30 A.M., and check in at the Admitting office.  Call this number if you have problems the morning of surgery:  [phone]  Call [phone] if you have any questions prior to your surgery date Monday-Friday 8am-4pm    Remember:  Do not eat or drink after midnight the night before your surgery    Take these medicines the morning of surgery with A SIP OF WATER  amLODipine (NORVASC) rosuvastatin (CRESTOR)  As of today, STOP taking any Aspirin (unless otherwise instructed by your surgeon) Aleve, Naproxen, Ibuprofen, Motrin, Advil, Goody's, BC's, all herbal medications, fish oil, and all vitamins.                      Do not wear jewelry            Do not wear lotions, powders, colognes, or deodorant.             Men may shave face and neck.            Do not bring valuables to the hospital.            [facility] is not responsible for any belongings or valuables.  Do NOT Smoke (Tobacco/Vaping) or drink Alcohol 24 hours prior to your procedure If you use a CPAP at night, you may bring all equipment for your overnight stay.   Contacts, glasses, dentures or bridgework may not be worn into surgery.      For patients admitted to the hospital, discharge time will be determined by your treatment team.   Patients discharged the day of surgery will not be allowed to drive home, and someone needs to stay with them for 24 hours.    Special instructions:   House- Preparing For Surgery  Before surgery, you can play an important role. Because skin is not sterile, your skin needs to be as free of germs as possible. You can reduce the number of germs on your skin by washing with CHG (chlorahexidine gluconate) Soap before surgery.  CHG is an antiseptic cleaner which kills germs and bonds with the skin to continue killing germs even after washing.    Oral Hygiene is also important to reduce  your risk of infection.  Remember - BRUSH YOUR TEETH THE MORNING OF SURGERY WITH YOUR REGULAR TOOTHPASTE  Please do not use if you have an allergy to CHG or antibacterial soaps. If your skin becomes reddened/irritated stop using the CHG.  Do not shave (including legs and underarms) for at least 48 hours prior to first CHG shower. It is OK to shave your face.  Please follow these instructions carefully.   1. Shower the NIGHT BEFORE SURGERY and the MORNING OF SURGERY with CHG Soap.   2. If you chose to wash your hair, wash your hair first as usual with your normal shampoo.  3. After you shampoo, rinse your hair and body thoroughly to remove the shampoo.  4. Use CHG as you would any other liquid soap. You can apply CHG directly to the skin and wash gently with a scrungie or a clean washcloth.   5. Apply the CHG Soap to your body ONLY FROM THE NECK DOWN.  Do not use on open wounds or open sores. Avoid contact with your eyes, ears, mouth and genitals (private parts). Wash Face and genitals (private parts)  with your normal soap.  6. Wash thoroughly, paying special attention to the area where your surgery will be performed.  7. Thoroughly rinse your body with warm water from the neck down.  8. DO NOT shower/wash with your normal soap after using and rinsing off the CHG Soap.  9. Pat yourself dry with a CLEAN TOWEL.  10. Wear CLEAN PAJAMAS to bed the night before surgery  11. Place CLEAN SHEETS on your bed the night of your first shower and DO NOT SLEEP WITH PETS.   Day of Surgery: Wear Clean/Comfortable clothing the morning of surgery Do not apply any deodorants/lotions.   Remember to brush your teeth WITH YOUR REGULAR TOOTHPASTE.   Please read over the following fact sheets that you were given.

## 2020-05-19 ENCOUNTER — Encounter (HOSPITAL_COMMUNITY): Payer: Self-pay

## 2020-05-19 NOTE — Progress Notes (Signed)
Anesthesia Chart Review:  Case: [id number] Date/Time: 05/22/20 0715   Procedure: OPEN ABDOMINAL AORTIC ANEURYSM REPAIR (N/A )   Anesthesia type: General   Pre-op diagnosis: AAA   Location: [facility] ROOM 11 / [facility] OR   Surgeons: Leonie Douglas, MD      DISCUSSION: Patient is a 55 year old male scheduled for the above procedure.  Surgery was initially scheduled for 04/08/20, surgery delayed for further cardiac testing after preoperative EKG showed LBBB with multiple CAD risk factors (see below).   History includes smoking, AAA (6.0 cm 05/12/20), dysrhythmia ("irregular heart beat" without known afib), left BBB, HTN, GERD, childhood asthma, pancreatitis (~ 2019; last 06/2018, likely due to alcohol use). 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  Initial cardiology evaluation with Dr. Jens Som 04/07/20. Preoperative stress test on 04/09/20 showed no infarct or ischemia with EF 49%, and Dr. Jens Som wrote, "[facility] for surgery" with elective echo to assess EF. This was done on 04/16/20 and showed EF 40-45%, grade 1 diastolic dysfunction, basal-mid inferolateral wall and inferior wall hypokinesis, trivial MR/AR. (Since Mr. Massi' echo follow-up is not scheduled until 06/01/20, I confirmed with Dr. Jens Som that Mr. Chery is still felt okay for surgery as scheduled from a cardiac standpoint.)   Preoperative COVID-19 testing is scheduled for 05/21/2020. Anesthesia team to evaluate on the day of surgery.    VS: BP 125/77   Pulse 79   Temp 36.4 C (Oral)   Resp 18   Ht 5\' 1"  (1.549 m)   Wt 55.1 kg   SpO2 98%   BMI 22.93 kg/m    PROVIDERS: No PCP  , MD is cardiologist   LABS: Labs reviewed: Acceptable for surgery. (all labs ordered are listed, but only abnormal results are displayed)  Labs Reviewed  SURGICAL PCR SCREEN  CBC  COMPREHENSIVE METABOLIC PANEL  PROTIME-INR  APTT  URINALYSIS, ROUTINE W REFLEX MICROSCOPIC  TYPE AND SCREEN     IMAGES: CTA Abd/pelvis  05/12/20: IMPRESSION: VASCULAR 1. Slight interval enlargement of fusiform infrarenal abdominal aortic aneurysm presently measuring 6.0 x 5.8 cm compared to 5.9 x 5.6 cm in November. Recommend referral to a vascular specialist. This recommendation follows ACR consensus guidelines: White Paper of the ACR Incidental Findings Committee II on Vascular Findings. J Am Coll Radiol 2013; 10:789-794. Aortic Atherosclerosis (ICD10-I70.0); Aortic aneurysm NOS (ICD10-I71.9). 2. 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 NON-VASCULAR 1. No acute abnormality within the abdomen or pelvis. 2. Incidental findings as above [see full report] without significant interval change.   EKG: 03/31/20: NSR. LBBB.    CV: Echo 04/16/20: IMPRESSIONS  1. Left ventricular ejection fraction, by estimation, is 40 to 45%. The  left ventricle has mildly decreased function. The left ventricle  demonstrates regional wall motion abnormalities (see scoring  diagram/findings for description). Left ventricular  diastolic parameters are consistent with Grade I diastolic dysfunction  (impaired relaxation). There is hypokinesis of the left ventricular,  basal-mid inferolateral wall and inferior wall.  2. Right ventricular systolic function is normal. The right ventricular  size is normal.  3. The mitral valve is normal in structure. Trivial mitral valve  regurgitation.  4. The aortic valve is tricuspid. There is mild calcification of the  aortic valve. Aortic valve regurgitation is trivial.    Nuclear stress test 04/09/20:  The left ventricular ejection fraction is mildly decreased (45-54%).  Nuclear stress EF: 49%.  There was no ST segment deviation noted during stress.  The study is normal.  Normal pharmacologic nuclear study with no evidence for prior infarct or ischemia. Mildly decreased LVEF 49%.    Past Medical History:  Diagnosis Date  . AAA  (abdominal aortic aneurysm) (HCC)   . Asthma    as a child  . Dysrhythmia    pt states they have told him he "has an irregular heart beat"  . GERD (gastroesophageal reflux disease)   . Hypertension   . Pancreatitis    hospitalization 06/2018 for acute pancreatitis, likely relatead to alcohol use    Past Surgical History:  Procedure Laterality Date  . HAND SURGERY Left     MEDICATIONS: . amLODipine (NORVASC) 5 MG tablet  . Ca Carbonate-Mag Hydroxide (ROLAIDS PO)  . nicotine (NICODERM CQ - DOSED IN MG/24 HOURS) 21 mg/24hr patch  . rosuvastatin (CRESTOR) 40 MG tablet   No current facility-administered medications for this encounter.    Shonna Chock, PA-C Surgical Short Stay/Anesthesiology [facility] Phone [phone] [facility] Phone [phone] 05/19/2020 5:25 PM

## 2020-05-19 NOTE — Anesthesia Preprocedure Evaluation (Addendum)
Anesthesia Evaluation  Patient identified by MRN, date of birth, ID band Patient awake    Reviewed: Allergy & Precautions, NPO status , Patient's Chart, lab work & pertinent test results  Airway Mallampati: I  TM Distance: >3 FB Neck ROM: Full    Dental  (+) Poor Dentition, Missing, Loose,    Pulmonary asthma , Current Smoker and Patient abstained from smoking.,    breath sounds clear to auscultation       Cardiovascular hypertension, Pt. on medications + Peripheral Vascular Disease   Rhythm:Regular Rate:Normal     Neuro/Psych negative neurological ROS  negative psych ROS   GI/Hepatic Neg liver ROS, GERD  ,  Endo/Other    Renal/GU negative Renal ROS     Musculoskeletal negative musculoskeletal ROS (+)   Abdominal Normal abdominal exam  (+)   Peds  Hematology   Anesthesia Other Findings   Reproductive/Obstetrics                           Anesthesia Physical Anesthesia Plan  ASA: III  Anesthesia Plan: General   Post-op Pain Management:    Induction: Intravenous  PONV Risk Score and Plan: 2 and Ondansetron and Midazolam  Airway Management Planned: Oral ETT  Additional Equipment: Arterial line, CVP and Ultrasound Guidance Line Placement  Intra-op Plan:   Post-operative Plan: Extubation in OR  Informed Consent: I have reviewed the patients History and Physical, chart, labs and discussed the procedure including the risks, benefits and alternatives for the proposed anesthesia with the patient or authorized representative who has indicated his/her understanding and acceptance.     Dental advisory given  Plan Discussed with: CRNA  Anesthesia Plan Comments: (PAT note written 05/19/2020 by Shonna Chock, PA-C.  Lab Results      Component                Value               Date                      WBC                      8.9                 05/18/2020                HGB                       15.8                05/18/2020                HCT                      48.7                05/18/2020                MCV                      97.0                05/18/2020                PLT  220                 05/18/2020            EKG: unchanged from previous tracings, normal sinus rhythm, LBBB.   Echo: 1. Left ventricular ejection fraction, by estimation, is 40 to 45%. The  left ventricle has mildly decreased function. The left ventricle  demonstrates regional wall motion abnormalities (see scoring  diagram/findings for description). Left ventricular  diastolic parameters are consistent with Grade I diastolic dysfunction  (impaired relaxation). There is hypokinesis of the left ventricular,  basal-mid inferolateral wall and inferior wall.  2. Right ventricular systolic function is normal. The right ventricular  size is normal.  3. The mitral valve is normal in structure. Trivial mitral valve  regurgitation.  4. The aortic valve is tricuspid. There is mild calcification of the  aortic valve. Aortic valve regurgitation is trivial)      Anesthesia Quick Evaluation

## 2020-05-21 ENCOUNTER — Other Ambulatory Visit (HOSPITAL_COMMUNITY)
Admission: RE | Admit: 2020-05-21 | Discharge: 2020-05-21 | Disposition: A | Discharge status: Home / Self Care | Payer: No Typology Code available for payment source | Source: Ambulatory Visit | Attending: Vascular Surgery | Admitting: Vascular Surgery

## 2020-05-21 DIAGNOSIS — Z20822 Contact with and (suspected) exposure to covid-19: Secondary | ICD-10-CM | POA: Insufficient documentation

## 2020-05-21 DIAGNOSIS — Z01812 Encounter for preprocedural laboratory examination: Secondary | ICD-10-CM | POA: Insufficient documentation

## 2020-05-21 LAB — SARS CORONAVIRUS 2 (TAT 6-24 HRS): SARS Coronavirus 2: NEGATIVE

## 2020-05-22 ENCOUNTER — Encounter (HOSPITAL_COMMUNITY)
Admission: RE | Disposition: A | Discharge status: Home / Self Care | Payer: Self-pay | Source: Ambulatory Visit | Attending: Vascular Surgery

## 2020-05-22 ENCOUNTER — Inpatient Hospital Stay (HOSPITAL_COMMUNITY)
Admission: RE | Admit: 2020-05-22 | Discharge: 2020-05-30 | DRG: 270 | Disposition: A | Discharge status: Home / Self Care | Payer: Medicaid Other | Source: Ambulatory Visit | Attending: Vascular Surgery | Admitting: Vascular Surgery

## 2020-05-22 ENCOUNTER — Inpatient Hospital Stay (HOSPITAL_COMMUNITY): Payer: Medicaid Other | Admitting: Certified Registered Nurse Anesthetist

## 2020-05-22 ENCOUNTER — Encounter (HOSPITAL_COMMUNITY): Payer: Self-pay

## 2020-05-22 ENCOUNTER — Other Ambulatory Visit: Payer: Self-pay

## 2020-05-22 ENCOUNTER — Inpatient Hospital Stay (HOSPITAL_COMMUNITY): Payer: Medicaid Other | Admitting: Vascular Surgery

## 2020-05-22 ENCOUNTER — Inpatient Hospital Stay (HOSPITAL_COMMUNITY): Payer: Medicaid Other

## 2020-05-22 DIAGNOSIS — Z9889 Other specified postprocedural states: Secondary | ICD-10-CM

## 2020-05-22 DIAGNOSIS — K2211 Ulcer of esophagus with bleeding: Secondary | ICD-10-CM | POA: Diagnosis present

## 2020-05-22 DIAGNOSIS — F17210 Nicotine dependence, cigarettes, uncomplicated: Secondary | ICD-10-CM | POA: Diagnosis present

## 2020-05-22 DIAGNOSIS — I714 Abdominal aortic aneurysm, without rupture, unspecified: Secondary | ICD-10-CM | POA: Diagnosis present

## 2020-05-22 DIAGNOSIS — E876 Hypokalemia: Secondary | ICD-10-CM | POA: Diagnosis not present

## 2020-05-22 DIAGNOSIS — J95821 Acute postprocedural respiratory failure: Secondary | ICD-10-CM | POA: Diagnosis not present

## 2020-05-22 DIAGNOSIS — D62 Acute posthemorrhagic anemia: Secondary | ICD-10-CM

## 2020-05-22 DIAGNOSIS — K920 Hematemesis: Secondary | ICD-10-CM

## 2020-05-22 DIAGNOSIS — I447 Left bundle-branch block, unspecified: Secondary | ICD-10-CM | POA: Diagnosis present

## 2020-05-22 DIAGNOSIS — J9811 Atelectasis: Secondary | ICD-10-CM | POA: Diagnosis not present

## 2020-05-22 DIAGNOSIS — R0902 Hypoxemia: Secondary | ICD-10-CM

## 2020-05-22 DIAGNOSIS — Z20822 Contact with and (suspected) exposure to covid-19: Secondary | ICD-10-CM | POA: Diagnosis present

## 2020-05-22 DIAGNOSIS — I739 Peripheral vascular disease, unspecified: Secondary | ICD-10-CM

## 2020-05-22 DIAGNOSIS — Z8249 Family history of ischemic heart disease and other diseases of the circulatory system: Secondary | ICD-10-CM | POA: Diagnosis not present

## 2020-05-22 DIAGNOSIS — Z79899 Other long term (current) drug therapy: Secondary | ICD-10-CM

## 2020-05-22 DIAGNOSIS — K559 Vascular disorder of intestine, unspecified: Secondary | ICD-10-CM | POA: Diagnosis present

## 2020-05-22 DIAGNOSIS — K2100 Gastro-esophageal reflux disease with esophagitis, without bleeding: Secondary | ICD-10-CM | POA: Diagnosis present

## 2020-05-22 DIAGNOSIS — I10 Essential (primary) hypertension: Secondary | ICD-10-CM | POA: Diagnosis present

## 2020-05-22 DIAGNOSIS — R12 Heartburn: Secondary | ICD-10-CM

## 2020-05-22 DIAGNOSIS — Z9289 Personal history of other medical treatment: Secondary | ICD-10-CM

## 2020-05-22 DIAGNOSIS — Z4659 Encounter for fitting and adjustment of other gastrointestinal appliance and device: Secondary | ICD-10-CM

## 2020-05-22 HISTORY — PX: AORTA - BILATERAL FEMORAL ARTERY BYPASS GRAFT: SHX1175

## 2020-05-22 LAB — POCT I-STAT 7, (LYTES, BLD GAS, ICA,H+H)
Acid-Base Excess: 0 mmol/L (ref 0.0–2.0)
Acid-Base Excess: 0 mmol/L (ref 0.0–2.0)
Acid-base deficit: 3 mmol/L — ABNORMAL HIGH (ref 0.0–2.0)
Bicarbonate: 24.2 mmol/L (ref 20.0–28.0)
Bicarbonate: 25.5 mmol/L (ref 20.0–28.0)
Bicarbonate: 25.3 mmol/L (ref 20.0–28.0)
Calcium, Ion: 1.22 mmol/L (ref 1.15–1.40)
Calcium, Ion: 1.22 mmol/L (ref 1.15–1.40)
Calcium, Ion: 1.23 mmol/L (ref 1.15–1.40)
HCT: 31 % — ABNORMAL LOW (ref 39.0–52.0)
HCT: 33 % — ABNORMAL LOW (ref 39.0–52.0)
HCT: 38 % — ABNORMAL LOW (ref 39.0–52.0)
Hemoglobin: 10.5 g/dL — ABNORMAL LOW (ref 13.0–17.0)
Hemoglobin: 11.2 g/dL — ABNORMAL LOW (ref 13.0–17.0)
Hemoglobin: 12.9 g/dL — ABNORMAL LOW (ref 13.0–17.0)
O2 Saturation: 100 %
O2 Saturation: 100 %
O2 Saturation: 97 %
Patient temperature: 36
Patient temperature: 36.2
Patient temperature: 37.8
Potassium: 4.7 mmol/L (ref 3.5–5.1)
Potassium: 5 mmol/L (ref 3.5–5.1)
Potassium: 5.2 mmol/L — ABNORMAL HIGH (ref 3.5–5.1)
Sodium: 136 mmol/L (ref 135–145)
Sodium: 137 mmol/L (ref 135–145)
Sodium: 135 mmol/L (ref 135–145)
TCO2: 26 mmol/L (ref 22–32)
TCO2: 27 mmol/L (ref 22–32)
TCO2: 27 mmol/L (ref 22–32)
pCO2 arterial: 41.8 mmHg (ref 32.0–48.0)
pCO2 arterial: 50.9 mmHg — ABNORMAL HIGH (ref 32.0–48.0)
pCO2 arterial: 45.2 mmHg (ref 32.0–48.0)
pH, Arterial: 7.279 — ABNORMAL LOW (ref 7.350–7.450)
pH, Arterial: 7.389 (ref 7.350–7.450)
pH, Arterial: 7.359 (ref 7.350–7.450)
pO2, Arterial: 249 mmHg — ABNORMAL HIGH (ref 83.0–108.0)
pO2, Arterial: 250 mmHg — ABNORMAL HIGH (ref 83.0–108.0)
pO2, Arterial: 96 mmHg (ref 83.0–108.0)

## 2020-05-22 LAB — CBC
HCT: 39.2 % (ref 39.0–52.0)
Hemoglobin: 12.7 g/dL — ABNORMAL LOW (ref 13.0–17.0)
MCH: 31.5 pg (ref 26.0–34.0)
MCHC: 32.4 g/dL (ref 30.0–36.0)
MCV: 97.3 fL (ref 80.0–100.0)
Platelets: 183 10*3/uL (ref 150–400)
RBC: 4.03 MIL/uL — ABNORMAL LOW (ref 4.22–5.81)
RDW: 14.2 % (ref 11.5–15.5)
WBC: 15.7 10*3/uL — ABNORMAL HIGH (ref 4.0–10.5)
nRBC: 0 % (ref 0.0–0.2)

## 2020-05-22 LAB — BASIC METABOLIC PANEL
Anion gap: 9 (ref 5–15)
BUN: 11 mg/dL (ref 6–20)
CO2: 23 mmol/L (ref 22–32)
Calcium: 8.5 mg/dL — ABNORMAL LOW (ref 8.9–10.3)
Chloride: 102 mmol/L (ref 98–111)
Creatinine, Ser: 0.84 mg/dL (ref 0.61–1.24)
GFR, Estimated: >60 mL/min (ref >60)
Glucose, Bld: 167 mg/dL — ABNORMAL HIGH (ref 70–99)
Potassium: 5 mmol/L (ref 3.5–5.1)
Sodium: 134 mmol/L — ABNORMAL LOW (ref 135–145)

## 2020-05-22 LAB — PROTIME-INR
INR: 1.1 (ref 0.8–1.2)
Prothrombin Time: 13.6 seconds (ref 11.4–15.2)

## 2020-05-22 LAB — LACTIC ACID, PLASMA: Lactic Acid, Venous: 1.5 mmol/L (ref 0.5–1.9)

## 2020-05-22 LAB — MAGNESIUM: Magnesium: 1.7 mg/dL (ref 1.7–2.4)

## 2020-05-22 LAB — APTT: aPTT: 32 seconds (ref 24–36)

## 2020-05-22 LAB — POCT ACTIVATED CLOTTING TIME: Activated Clotting Time: 273 seconds

## 2020-05-22 SURGERY — CREATION, BYPASS, ARTERIAL, AORTA TO FEMORAL, BILATERAL, USING GRAFT
Anesthesia: General | Site: Abdomen

## 2020-05-22 MED ORDER — THROMBIN 20000 UNITS EX SOLR
OROMUCOSAL | Status: DC | PRN
  Administered 2020-05-22: 10 mL via TOPICAL

## 2020-05-22 MED ORDER — DEXAMETHASONE SODIUM PHOSPHATE 10 MG/ML IJ SOLN
INTRAMUSCULAR | Status: AC
  Filled 2020-05-22: qty 1

## 2020-05-22 MED ORDER — ATORVASTATIN CALCIUM 10 MG PO TABS: 10.0000 mg | ORAL_TABLET | Freq: Every day | ORAL | Status: DC

## 2020-05-22 MED ORDER — HEPARIN SODIUM (PORCINE) 5000 UNIT/ML IJ SOLN
5000.0000 [IU] | Freq: Three times a day (TID) | INTRAMUSCULAR | Status: DC
  Administered 2020-05-22 – 2020-05-24 (×7): 5000 [IU] via SUBCUTANEOUS
  Filled 2020-05-22 (×8): qty 1

## 2020-05-22 MED ORDER — PROTAMINE SULFATE 10 MG/ML IV SOLN
INTRAVENOUS | Status: DC | PRN
  Administered 2020-05-22: 40 mg via INTRAVENOUS

## 2020-05-22 MED ORDER — ALUM & MAG HYDROXIDE-SIMETH 200-200-20 MG/5ML PO SUSP
15.0000 mL | ORAL | Status: DC | PRN
  Administered 2020-05-29: 30 mL via ORAL
  Filled 2020-05-22: qty 30

## 2020-05-22 MED ORDER — DIPHENHYDRAMINE HCL 12.5 MG/5ML PO ELIX: 12.5000 mg | ORAL_SOLUTION | Freq: Four times a day (QID) | ORAL | Status: DC | PRN

## 2020-05-22 MED ORDER — HYDROMORPHONE HCL 1 MG/ML IJ SOLN
0.2500 mg | INTRAMUSCULAR | Status: DC | PRN
  Administered 2020-05-22 (×3): 0.5 mg via INTRAVENOUS

## 2020-05-22 MED ORDER — CHLORHEXIDINE GLUCONATE 0.12 % MT SOLN
OROMUCOSAL | Status: AC
  Filled 2020-05-22: qty 15

## 2020-05-22 MED ORDER — GUAIFENESIN-DM 100-10 MG/5ML PO SYRP
15.0000 mL | ORAL_SOLUTION | ORAL | Status: DC | PRN
  Administered 2020-05-29: 15 mL via ORAL
  Filled 2020-05-22: qty 15

## 2020-05-22 MED ORDER — OXYCODONE-ACETAMINOPHEN 5-325 MG PO TABS
1.0000 | ORAL_TABLET | ORAL | Status: DC | PRN
  Administered 2020-05-23 – 2020-05-30 (×16): 2 via ORAL
  Filled 2020-05-22 (×16): qty 2

## 2020-05-22 MED ORDER — NITROGLYCERIN 0.2 MG/ML ON CALL CATH LAB
INTRAVENOUS | Status: DC | PRN
  Administered 2020-05-22: 20 ug via INTRAVENOUS

## 2020-05-22 MED ORDER — LACTATED RINGERS IV SOLN: INTRAVENOUS | Status: DC

## 2020-05-22 MED ORDER — PROPOFOL 10 MG/ML IV BOLUS
INTRAVENOUS | Status: AC
  Filled 2020-05-22: qty 20

## 2020-05-22 MED ORDER — NALOXONE HCL 0.4 MG/ML IJ SOLN: 0.4000 mg | INTRAMUSCULAR | Status: DC | PRN

## 2020-05-22 MED ORDER — CHLORHEXIDINE GLUCONATE CLOTH 2 % EX PADS
6.0000 | MEDICATED_PAD | Freq: Every day | CUTANEOUS | Status: DC
  Administered 2020-05-23 – 2020-05-27 (×6): 6 via TOPICAL

## 2020-05-22 MED ORDER — HYDROMORPHONE HCL 1 MG/ML IJ SOLN
INTRAMUSCULAR | Status: AC
  Administered 2020-05-22: 0.5 mg via INTRAVENOUS
  Filled 2020-05-22: qty 2

## 2020-05-22 MED ORDER — PHENOL 1.4 % MT LIQD: 1.0000 | OROMUCOSAL | Status: DC | PRN

## 2020-05-22 MED ORDER — THROMBIN 20000 UNITS EX SOLR: CUTANEOUS | Status: DC | PRN

## 2020-05-22 MED ORDER — SUFENTANIL CITRATE 50 MCG/ML IV SOLN
INTRAVENOUS | Status: DC | PRN
  Administered 2020-05-22 (×5): 10 ug via INTRAVENOUS

## 2020-05-22 MED ORDER — ONDANSETRON HCL 4 MG/2ML IJ SOLN
INTRAMUSCULAR | Status: AC
  Filled 2020-05-22: qty 2

## 2020-05-22 MED ORDER — DIPHENHYDRAMINE HCL 50 MG/ML IJ SOLN: 12.5000 mg | Freq: Four times a day (QID) | INTRAMUSCULAR | Status: DC | PRN

## 2020-05-22 MED ORDER — POTASSIUM CHLORIDE CRYS ER 20 MEQ PO TBCR: 20.0000 meq | EXTENDED_RELEASE_TABLET | Freq: Every day | ORAL | Status: DC | PRN

## 2020-05-22 MED ORDER — OXYCODONE HCL 5 MG/5ML PO SOLN: 5.0000 mg | Freq: Once | ORAL | Status: DC | PRN

## 2020-05-22 MED ORDER — KCL IN DEXTROSE-NACL 20-5-0.45 MEQ/L-%-% IV SOLN
INTRAVENOUS | Status: DC
  Filled 2020-05-22: qty 1000

## 2020-05-22 MED ORDER — NICOTINE 21 MG/24HR TD PT24
21.0000 mg | MEDICATED_PATCH | Freq: Every day | TRANSDERMAL | Status: DC
  Administered 2020-05-22 – 2020-05-30 (×9): 21 mg via TRANSDERMAL
  Filled 2020-05-22 (×9): qty 1

## 2020-05-22 MED ORDER — ROCURONIUM 10MG/ML (10ML) SYRINGE FOR MEDFUSION PUMP - OPTIME
INTRAVENOUS | Status: DC | PRN
  Administered 2020-05-22 (×2): 100 mg via INTRAVENOUS

## 2020-05-22 MED ORDER — ACETAMINOPHEN 325 MG PO TABS
325.0000 mg | ORAL_TABLET | ORAL | Status: DC | PRN
  Administered 2020-05-26: 650 mg via ORAL
  Filled 2020-05-22: qty 2

## 2020-05-22 MED ORDER — FLEET ENEMA 7-19 GM/118ML RE ENEM
1.0000 | ENEMA | Freq: Once | RECTAL | Status: DC | PRN
  Filled 2020-05-22: qty 1

## 2020-05-22 MED ORDER — PROPOFOL 10 MG/ML IV BOLUS
INTRAVENOUS | Status: DC | PRN
Start: 2020-05-22 — End: 2020-05-22
  Administered 2020-05-22: 80 mg via INTRAVENOUS

## 2020-05-22 MED ORDER — KETAMINE HCL 50 MG/5ML IJ SOSY
PREFILLED_SYRINGE | INTRAMUSCULAR | Status: AC
  Filled 2020-05-22: qty 5

## 2020-05-22 MED ORDER — PHENYLEPHRINE HCL-NACL 10-0.9 MG/250ML-% IV SOLN
INTRAVENOUS | Status: DC | PRN
  Administered 2020-05-22: 40 ug/min via INTRAVENOUS

## 2020-05-22 MED ORDER — CEFAZOLIN SODIUM-DEXTROSE 2-4 GM/100ML-% IV SOLN
2.0000 g | Freq: Three times a day (TID) | INTRAVENOUS | Status: AC
  Administered 2020-05-22 – 2020-05-23 (×2): 2 g via INTRAVENOUS
  Filled 2020-05-22 (×2): qty 100

## 2020-05-22 MED ORDER — SUGAMMADEX SODIUM 200 MG/2ML IV SOLN
INTRAVENOUS | Status: DC | PRN
  Administered 2020-05-22: 200 mg via INTRAVENOUS

## 2020-05-22 MED ORDER — CEFAZOLIN SODIUM-DEXTROSE 2-4 GM/100ML-% IV SOLN
2.0000 g | INTRAVENOUS | Status: AC
  Administered 2020-05-22: 2 g via INTRAVENOUS
  Filled 2020-05-22: qty 100

## 2020-05-22 MED ORDER — MAGNESIUM SULFATE 2 GM/50ML IV SOLN: 2.0000 g | Freq: Every day | INTRAVENOUS | Status: DC | PRN

## 2020-05-22 MED ORDER — MEPERIDINE HCL 25 MG/ML IJ SOLN: 6.2500 mg | INTRAMUSCULAR | Status: DC | PRN

## 2020-05-22 MED ORDER — ALBUMIN HUMAN 5 % IV SOLN: INTRAVENOUS | Status: DC | PRN

## 2020-05-22 MED ORDER — MIDAZOLAM HCL 2 MG/2ML IJ SOLN
INTRAMUSCULAR | Status: DC | PRN
  Administered 2020-05-22: 2 mg via INTRAVENOUS

## 2020-05-22 MED ORDER — NITROGLYCERIN IN D5W 200-5 MCG/ML-% IV SOLN
INTRAVENOUS | Status: AC
  Filled 2020-05-22: qty 250

## 2020-05-22 MED ORDER — LABETALOL HCL 5 MG/ML IV SOLN
10.0000 mg | INTRAVENOUS | Status: DC | PRN
  Administered 2020-05-22 – 2020-05-23 (×2): 10 mg via INTRAVENOUS
  Filled 2020-05-22 (×2): qty 4

## 2020-05-22 MED ORDER — SODIUM CHLORIDE 0.9 % IV SOLN: INTRAVENOUS | Status: DC

## 2020-05-22 MED ORDER — ACETAMINOPHEN 10 MG/ML IV SOLN: 1000.0000 mg | Freq: Once | INTRAVENOUS | Status: DC | PRN

## 2020-05-22 MED ORDER — SUFENTANIL CITRATE 50 MCG/ML IV SOLN
INTRAVENOUS | Status: AC
  Filled 2020-05-22: qty 1

## 2020-05-22 MED ORDER — PANTOPRAZOLE SODIUM 40 MG PO TBEC
40.0000 mg | DELAYED_RELEASE_TABLET | Freq: Every day | ORAL | Status: DC
  Administered 2020-05-23 – 2020-05-24 (×2): 40 mg via ORAL
  Filled 2020-05-22 (×2): qty 1

## 2020-05-22 MED ORDER — CHLORHEXIDINE GLUCONATE CLOTH 2 % EX PADS: 6.0000 | MEDICATED_PAD | Freq: Once | CUTANEOUS | Status: DC

## 2020-05-22 MED ORDER — SODIUM CHLORIDE 0.9 % IV SOLN
INTRAVENOUS | Status: AC
  Filled 2020-05-22: qty 1.2

## 2020-05-22 MED ORDER — THROMBIN (RECOMBINANT) 20000 UNITS EX SOLR
CUTANEOUS | Status: AC
  Filled 2020-05-22: qty 20000

## 2020-05-22 MED ORDER — LORAZEPAM 0.5 MG PO TABS
0.5000 mg | ORAL_TABLET | Freq: Four times a day (QID) | ORAL | Status: AC
Start: 2020-05-22 — End: 2020-05-25
  Administered 2020-05-22 – 2020-05-25 (×9): 0.5 mg via ORAL
  Filled 2020-05-22 (×9): qty 1

## 2020-05-22 MED ORDER — HYDROMORPHONE 1 MG/ML IV SOLN
INTRAVENOUS | Status: DC
  Administered 2020-05-22: 30 mg via INTRAVENOUS
  Administered 2020-05-22: 1.8 mg via INTRAVENOUS
  Administered 2020-05-23: 0.9 mg via INTRAVENOUS
  Administered 2020-05-23: 0.6 mg via INTRAVENOUS
  Administered 2020-05-23: 3 mg via INTRAVENOUS
  Administered 2020-05-23: 0.6 mg via INTRAVENOUS

## 2020-05-22 MED ORDER — LACTATED RINGERS IV SOLN: INTRAVENOUS | Status: DC | PRN

## 2020-05-22 MED ORDER — METOPROLOL TARTRATE 5 MG/5ML IV SOLN
2.0000 mg | INTRAVENOUS | Status: DC | PRN
  Administered 2020-05-24: 3 mg via INTRAVENOUS
  Filled 2020-05-22: qty 5

## 2020-05-22 MED ORDER — LIDOCAINE HCL (CARDIAC) PF 100 MG/5ML IV SOSY
PREFILLED_SYRINGE | INTRAVENOUS | Status: DC | PRN
  Administered 2020-05-22: 100 mg via INTRAVENOUS

## 2020-05-22 MED ORDER — ORAL CARE MOUTH RINSE
15.0000 mL | Freq: Two times a day (BID) | OROMUCOSAL | Status: DC
  Administered 2020-05-23 – 2020-05-29 (×14): 15 mL via OROMUCOSAL

## 2020-05-22 MED ORDER — DEXTROSE-NACL 5-0.45 % IV SOLN
INTRAVENOUS | Status: DC
  Administered 2020-05-23: 100 mL/h via INTRAVENOUS

## 2020-05-22 MED ORDER — ACETAMINOPHEN 160 MG/5ML PO SOLN: 325.0000 mg | Freq: Once | ORAL | Status: DC | PRN

## 2020-05-22 MED ORDER — HYDRALAZINE HCL 20 MG/ML IJ SOLN
5.0000 mg | INTRAMUSCULAR | Status: DC | PRN
  Administered 2020-05-22: 5 mg via INTRAVENOUS
  Filled 2020-05-22: qty 1

## 2020-05-22 MED ORDER — SODIUM BICARBONATE 8.4 % IV SOLN
INTRAVENOUS | Status: DC | PRN
  Administered 2020-05-22: 20 mL via INTRAVENOUS

## 2020-05-22 MED ORDER — HEPARIN SODIUM (PORCINE) 1000 UNIT/ML IJ SOLN
INTRAMUSCULAR | Status: DC | PRN
  Administered 2020-05-22: 6000 [IU] via INTRAVENOUS

## 2020-05-22 MED ORDER — SODIUM CHLORIDE 0.9% FLUSH: 9.0000 mL | INTRAVENOUS | Status: DC | PRN

## 2020-05-22 MED ORDER — THROMBIN 5000 UNITS EX SOLR
CUTANEOUS | Status: AC
  Filled 2020-05-22: qty 10000

## 2020-05-22 MED ORDER — AMLODIPINE BESYLATE 5 MG PO TABS
5.0000 mg | ORAL_TABLET | Freq: Every day | ORAL | Status: DC
Start: 2020-05-23 — End: 2020-05-30
  Administered 2020-05-23 – 2020-05-30 (×8): 5 mg via ORAL
  Filled 2020-05-22 (×8): qty 1

## 2020-05-22 MED ORDER — CALCIUM CHLORIDE 10 % IV SOLN
INTRAVENOUS | Status: DC | PRN
  Administered 2020-05-22: 200 mg via INTRAVENOUS

## 2020-05-22 MED ORDER — SODIUM CHLORIDE 0.9 % IV SOLN
INTRAVENOUS | Status: DC | PRN
  Administered 2020-05-22: 500 mL

## 2020-05-22 MED ORDER — SENNOSIDES-DOCUSATE SODIUM 8.6-50 MG PO TABS: 1.0000 | ORAL_TABLET | Freq: Every evening | ORAL | Status: DC | PRN

## 2020-05-22 MED ORDER — OXYCODONE HCL 5 MG PO TABS: 5.0000 mg | ORAL_TABLET | Freq: Once | ORAL | Status: DC | PRN

## 2020-05-22 MED ORDER — HYDROMORPHONE 1 MG/ML IV SOLN
INTRAVENOUS | Status: AC
  Filled 2020-05-22: qty 30

## 2020-05-22 MED ORDER — DEXAMETHASONE SODIUM PHOSPHATE 10 MG/ML IJ SOLN
INTRAMUSCULAR | Status: DC | PRN
  Administered 2020-05-22: 5 mg via INTRAVENOUS

## 2020-05-22 MED ORDER — ONDANSETRON HCL 4 MG/2ML IJ SOLN
4.0000 mg | Freq: Four times a day (QID) | INTRAMUSCULAR | Status: DC | PRN
  Administered 2020-05-22 – 2020-05-24 (×6): 4 mg via INTRAVENOUS
  Filled 2020-05-22 (×7): qty 2

## 2020-05-22 MED ORDER — 0.9 % SODIUM CHLORIDE (POUR BTL) OPTIME
TOPICAL | Status: DC | PRN
  Administered 2020-05-22: 2000 mL

## 2020-05-22 MED ORDER — KETAMINE HCL 10 MG/ML IJ SOLN
INTRAMUSCULAR | Status: DC | PRN
  Administered 2020-05-22 (×2): 10 mg via INTRAVENOUS

## 2020-05-22 MED ORDER — PROTAMINE SULFATE 10 MG/ML IV SOLN
INTRAVENOUS | Status: AC
  Filled 2020-05-22: qty 5

## 2020-05-22 MED ORDER — BISACODYL 5 MG PO TBEC
5.0000 mg | DELAYED_RELEASE_TABLET | Freq: Every day | ORAL | Status: DC | PRN
Start: 2020-05-22 — End: 2020-05-30

## 2020-05-22 MED ORDER — DEXMEDETOMIDINE (PRECEDEX) IN NS 20 MCG/5ML (4 MCG/ML) IV SYRINGE
PREFILLED_SYRINGE | INTRAVENOUS | Status: DC | PRN
  Administered 2020-05-22 (×3): 4 ug via INTRAVENOUS

## 2020-05-22 MED ORDER — ACETAMINOPHEN 325 MG RE SUPP
325.0000 mg | RECTAL | Status: DC | PRN
  Filled 2020-05-22: qty 2

## 2020-05-22 MED ORDER — MIDAZOLAM HCL 2 MG/2ML IJ SOLN
INTRAMUSCULAR | Status: AC
  Filled 2020-05-22: qty 2

## 2020-05-22 MED ORDER — SODIUM CHLORIDE 0.9 % IV SOLN: 500.0000 mL | Freq: Once | INTRAVENOUS | Status: DC | PRN

## 2020-05-22 MED ORDER — AMISULPRIDE (ANTIEMETIC) 5 MG/2ML IV SOLN: 10.0000 mg | Freq: Once | INTRAVENOUS | Status: DC | PRN

## 2020-05-22 MED ORDER — ONDANSETRON HCL 4 MG/2ML IJ SOLN
INTRAMUSCULAR | Status: DC | PRN
Start: 2020-05-22 — End: 2020-05-22
  Administered 2020-05-22: 4 mg via INTRAVENOUS

## 2020-05-22 MED ORDER — HEPARIN SODIUM (PORCINE) 1000 UNIT/ML IJ SOLN
INTRAMUSCULAR | Status: AC
  Filled 2020-05-22: qty 1

## 2020-05-22 MED ORDER — ACETAMINOPHEN 325 MG PO TABS: 325.0000 mg | ORAL_TABLET | Freq: Once | ORAL | Status: DC | PRN

## 2020-05-22 MED ORDER — ESMOLOL HCL 100 MG/10ML IV SOLN
INTRAVENOUS | Status: DC | PRN
  Administered 2020-05-22 (×2): 20 mg via INTRAVENOUS
  Administered 2020-05-22 (×2): 30 mg via INTRAVENOUS

## 2020-05-22 MED ORDER — ASPIRIN EC 81 MG PO TBEC
81.0000 mg | DELAYED_RELEASE_TABLET | Freq: Every day | ORAL | Status: DC
  Administered 2020-05-23 – 2020-05-30 (×8): 81 mg via ORAL
  Filled 2020-05-22 (×7): qty 1

## 2020-05-22 MED ORDER — DOCUSATE SODIUM 100 MG PO CAPS
100.0000 mg | ORAL_CAPSULE | Freq: Every day | ORAL | Status: DC
  Administered 2020-05-23 – 2020-05-30 (×8): 100 mg via ORAL
  Filled 2020-05-22 (×8): qty 1

## 2020-05-22 MED ORDER — ROSUVASTATIN CALCIUM 20 MG PO TABS
40.0000 mg | ORAL_TABLET | Freq: Every day | ORAL | Status: DC
  Administered 2020-05-23 – 2020-05-30 (×8): 40 mg via ORAL
  Filled 2020-05-22 (×8): qty 2

## 2020-05-22 SURGICAL SUPPLY — 56 items
ADH SKN CLS APL DERMABOND .7 (GAUZE/BANDAGES/DRESSINGS) ×1
CANISTER SUCT 3000ML PPV (MISCELLANEOUS) ×2 IMPLANT
CLIP VESOCCLUDE MED 24/CT (CLIP) ×2 IMPLANT
CLIP VESOCCLUDE SM WIDE 24/CT (CLIP) ×2 IMPLANT
COVER WAND RF STERILE (DRAPES) ×1 IMPLANT
DERMABOND ADVANCED (GAUZE/BANDAGES/DRESSINGS) ×1
DERMABOND ADVANCED .7 DNX12 (GAUZE/BANDAGES/DRESSINGS) ×2 IMPLANT
ELECT BLADE 4.0 EZ CLEAN MEGAD (MISCELLANEOUS) ×2
ELECT BLADE 6.5 EXT (BLADE) IMPLANT
ELECT REM PT RETURN 9FT ADLT (ELECTROSURGICAL) ×2
ELECTRODE BLDE 4.0 EZ CLN MEGD (MISCELLANEOUS) ×1 IMPLANT
ELECTRODE REM PT RTRN 9FT ADLT (ELECTROSURGICAL) ×1 IMPLANT
FELT TEFLON 1X6 (MISCELLANEOUS) IMPLANT
GLOVE SURG SS PI 8.0 STRL IVOR (GLOVE) ×2 IMPLANT
GLOVE SURG UNDER POLY LF SZ6.5 (GLOVE) ×2 IMPLANT
GOWN STRL REUS W/ TWL LRG LVL3 (GOWN DISPOSABLE) ×2 IMPLANT
GOWN STRL REUS W/ TWL XL LVL3 (GOWN DISPOSABLE) ×1 IMPLANT
GOWN STRL REUS W/TWL LRG LVL3 (GOWN DISPOSABLE) ×10
GOWN STRL REUS W/TWL XL LVL3 (GOWN DISPOSABLE) ×2
GRAFT HEMASHIELD 16MM (Vascular Products) ×1 IMPLANT
INSERT FOGARTY 61MM (MISCELLANEOUS) ×1 IMPLANT
INSERT FOGARTY SM (MISCELLANEOUS) ×4 IMPLANT
KIT BASIN OR (CUSTOM PROCEDURE TRAY) ×2 IMPLANT
KIT TURNOVER KIT B (KITS) ×2 IMPLANT
NS IRRIG 1000ML POUR BTL (IV SOLUTION) ×4 IMPLANT
PACK AORTA (CUSTOM PROCEDURE TRAY) ×2 IMPLANT
PAD ARMBOARD 7.5X6 YLW CONV (MISCELLANEOUS) ×4 IMPLANT
RETAINER VISCERA MED (MISCELLANEOUS) ×2 IMPLANT
SUT MNCRL AB 4-0 PS2 18 (SUTURE) ×4 IMPLANT
SUT PDS AB 1 TP1 96 (SUTURE) ×4 IMPLANT
SUT PROLENE 3 0 SH 48 (SUTURE) ×1 IMPLANT
SUT PROLENE 3 0 SH1 36 (SUTURE) ×3 IMPLANT
SUT PROLENE 4 0 RB 1 (SUTURE) ×4
SUT PROLENE 4-0 RB1 .5 CRCL 36 (SUTURE) ×2 IMPLANT
SUT PROLENE 5 0 C 1 24 (SUTURE) ×4 IMPLANT
SUT PROLENE 5 0 C 1 36 (SUTURE) IMPLANT
SUT SILK 2 0 (SUTURE) ×2
SUT SILK 2 0 SH (SUTURE) ×1 IMPLANT
SUT SILK 2 0 TIES 17X18 (SUTURE) ×2
SUT SILK 2 0SH CR/8 30 (SUTURE) ×2 IMPLANT
SUT SILK 2-0 18XBRD TIE 12 (SUTURE) ×1 IMPLANT
SUT SILK 2-0 18XBRD TIE BLK (SUTURE) ×1 IMPLANT
SUT SILK 3 0 (SUTURE) ×2
SUT SILK 3 0 TIES 17X18 (SUTURE) ×2
SUT SILK 3-0 18XBRD TIE 12 (SUTURE) ×1 IMPLANT
SUT SILK 3-0 18XBRD TIE BLK (SUTURE) ×1 IMPLANT
SUT VIC AB 0 CT1 18XCR BRD 8 (SUTURE) IMPLANT
SUT VIC AB 0 CT1 8-18 (SUTURE) ×4
SUT VIC AB 2-0 CT1 27 (SUTURE) ×10
SUT VIC AB 2-0 CT1 TAPERPNT 27 (SUTURE) ×5 IMPLANT
SUT VIC AB 3-0 SH 27 (SUTURE) ×4
SUT VIC AB 3-0 SH 27X BRD (SUTURE) ×2 IMPLANT
TOWEL GREEN STERILE (TOWEL DISPOSABLE) ×2 IMPLANT
TOWEL SURG RFD BLUE STRL DISP (DISPOSABLE) ×2 IMPLANT
TRAY FOLEY MTR SLVR 16FR STAT (SET/KITS/TRAYS/PACK) ×2 IMPLANT
WATER STERILE IRR 1000ML POUR (IV SOLUTION) ×4 IMPLANT

## 2020-05-22 NOTE — Transfer of Care (Signed)
Immediate Anesthesia Transfer of Care Note  Patient: Justin Poole  Procedure(s) Performed: OPEN ABDOMINAL AORTIC ANEURYSM REPAIR WITH HEMASHIELD GRAFT (N/A Abdomen)  Patient Location: PACU  Anesthesia Type:General  Level of Consciousness: drowsy  Airway & Oxygen Therapy: Patient Spontanous Breathing and Patient connected to face mask oxygen  Post-op Assessment: Report given to RN and Post -op Vital signs reviewed and stable  Post vital signs: Reviewed and stable  Last Vitals:  Vitals Value Taken Time  BP 168/84   Temp 36   Pulse 80 05/22/20 1207  Resp 22 05/22/20 1207  SpO2 100 % 05/22/20 1207  Vitals shown include unvalidated device data.  Last Pain:  Vitals:   05/22/20 0631  PainSc: (P) 0-No pain         Complications: No complications documented.

## 2020-05-22 NOTE — Anesthesia Procedure Notes (Signed)
Central Venous Catheter Insertion Performed by: Shelton Silvas, MD, anesthesiologist Start/End1/14/2022 7:00 AM, 05/22/2020 7:10 AM Patient location: Pre-op. Preanesthetic checklist: patient identified, IV checked, site marked, risks and benefits discussed, surgical consent, monitors and equipment checked, pre-op evaluation, timeout performed and anesthesia consent Position: Trendelenburg Lidocaine 1% used for infiltration and patient sedated Hand hygiene performed , maximum sterile barriers used  and Seldinger technique used Catheter size: 8.5 Fr Total catheter length 10. Central line was placed.Sheath introducer Swan type:thermodilution PA Cath depth:50 Procedure performed using ultrasound guided technique. Ultrasound Notes:anatomy identified, needle tip was noted to be adjacent to the nerve/plexus identified, no ultrasound evidence of intravascular and/or intraneural injection and image(s) printed for medical record Attempts: 1 Following insertion, line sutured and dressing applied. Post procedure assessment: blood return through all ports, free fluid flow and no air  Patient tolerated the procedure well with no immediate complications.

## 2020-05-22 NOTE — Discharge Instructions (Signed)
 [facility]  Discharge Instructions   Open Aortic Surgery  Please refer to the following instructions for your post-procedure care. Your surgeon or Physician Assistant will discuss any changes with you.  Activity  Avoid lifting more than eight pounds (a gallon of milk) until after your first post-operative visit. You are encouraged to walk as much as you can. You can slowly return to normal activities but must avoid strenuous activity and heavy lifting until your doctor tells you it's OK. Heavy lifting can hurt the incision and cause a hernia. Avoid activities such as vacuuming or swinging a golf club. It is normal to feel tired for several weeks after your surgery. Do not drive until your doctor gives the OK and you are no longer taking prescription pain medications. It is also normal to have difficulty with sleep habits, eating and bowl movements after surgery. These will go away with time.  Bathing/Showering  You may shower after you go home. Do not soak in a bathtub, hot tub, or swim until the incision heals.  Incision Care  Shower every day. Clean your incision with mild soap and water. Pat the area dry with a clean towel. You do not need a bandage unless otherwise instructed. Do not apply any ointments or creams to your incision. You may have skin glue on your incision. Do not peel it off. It will come off on its own in about one week. If you have staples or sutures along your incision, they will be removed at your post op appointment.  Diet  Resume your normal diet. There are no special food restriction following this procedure. A low fat/low cholesterol diet is recommended for all patients with vascular disease. After your aortic surgery, it's normal to feel full faster than usual and to not feel as hungry as you normally would. You will probably lose weight initially following your surgery. It's best to eat small, frequent meals over the course of  the day. Call the office if you find that you are unable to eat even small meals. In order to heal from your surgery, it is CRITICAL to get adequate nutrition. Your body requires vitamins, minerals, and protein. Vegetables are the best source of vitamins and minerals. causing pain, you may take over-the-counter pain reliever such as acetaminophen (Tylenol). If you were prescribed a stronger pain medication, please be aware these medication can cause nausea and constipation. Prevent nausea by taking the medication with a snack or meal. Avoid constipation by drinking plenty of fluids and eating foods with a high amount of fiber, such as fruits, vegetables and grains. Take 100mg of the over-the-counter stool softener Colace twice a day as needed to help with constipation. A laxative, such as Milk of Magnesia, may be recommended for you at this time. Do not take a laxative unless your surgeon or P.A. tells you it's OK. Do not take Tylenol if you are taking stronger pain medications (such as Percocet).  Follow Up  Our office will schedule a follow up appointment 2-3 weeks after discharge.  Please call us immediately for any of the following conditions    .     Severe or worsening pain in your legs or feet or in your abdomen back or chest. Increased pain, redness drainage (pus) from your incision site. Increased abdominal pain, bloating, nausea, vomiting, or persistent diarrhea. Fever of 101 degrees or higher. Swelling in your leg (s).  Reduce your risk of vascular disease  Stop smoking.   If you would like help, call [facility] at 1-800-QUIT-NOW (1-800-784-8669) or  at 336-586-4000. Manage your cholesterol Maintain a desired weight Control your diabetes Keep your blood pressure down  If you have any questions please call the office at [phone].   

## 2020-05-22 NOTE — Plan of Care (Signed)
?  Problem: Bowel/Gastric: ?Goal: Gastrointestinal status for postoperative course will improve ?Outcome: Progressing ?  ?Problem: Clinical Measurements: ?Goal: Postoperative complications will be avoided or minimized ?Outcome: Progressing ?  ?

## 2020-05-22 NOTE — H&P (Signed)
ASSESSMENT & PLAN:  Justin Poole is a 55 y.o. male with a infrarenal abdominal aortic aneurysm measuring 55mm in greatest orthogonal dimension.  A statement from the Joint Council of the American Association for Vascular Surgery and Society for Vascular Surgery estimated the annual rupture risk according to AAA diameter to be the following: 5.0 cm to 5.9 cm in diameter - 3% to 15%  The patient is good a candidate for elective repair of the aneurysm to prevent rupture.  I explained the risks / benefits / alternatives to different approaches to aortic reconstruction.   I explained the specific benefits of open repair including improved durability, limited requirement for surveillance, less need for secondary intervention. I explained the specific risks from open repair including higher physiologic stress from aortic cross clamping, higher risk of perioperative complication (including stroke, MI, pneumonia, renal insufficiency and failure, death, etc.), higher risk of abdominal wall complications, risk of anastomotic pseudoaneurysm.  I explained the specific benefits of endovascular repair including limited physiologic stress and less recovery time, lower risk of serious perioperative complication, zero risk of abdominal wall complication.  I explained the specific risks from endovascular repair including need for lifetime surveillance, risk of large-bore arterial access, risk of requiring secondary intervention to maintain seal or patency. I explained that not all patients are candidates for endovascular repair based on their unique anatomy.  After detailed discussion the patient desires open repair.    The patient should continue best medical therapy including: Complete cessation from all tobacco products. Excellent blood glucose control with goal A1c < 7%. Blood pressure control with goal blood pressure < 140/90 mmHg. Lipid reduction therapy with goal LDL-C <100 mg/dL. Aspirin 81mg  PO QD.   Atorvastatin 40-80mg  PO QD (or other "high intensity" statin therapy).  Will plan for open infrarenal aortic aneurysm repair today with me and my partner Dr. .   CHIEF COMPLAINT:   Abdominal aortic aneurysm  HISTORY:  HISTORY OF PRESENT ILLNESS: Justin Poole is a 55 y.o. male who initially presented to [facility], ER for evaluation of musculoskeletal left back pain.  He had a documented history of abdominal aortic aneurysm.  CT a in the emergency department revealed a 5.9 cm infrarenal abdominal aortic aneurysm. He reports his back pain is improved. He reports excellent performance status. He denies exertional angina or dyspnea. He can climb a flight of stairs without difficulty.   He was delayed for cardiac workup which demonstrated a new left bundle branch block. He had no modifiable cardiac risk factors after workup after evaluation by cardiology, who were comfortable with [facility] proceeding.     Past Medical History:  Diagnosis Date  . AAA (abdominal aortic aneurysm) (HCC)   . Asthma    as a child  . Dysrhythmia    pt states they have told him he "has an irregular heart beat"  . GERD (gastroesophageal reflux disease)   . Hypertension   . Left bundle branch block   . Pancreatitis    hospitalization 06/2018 for acute pancreatitis, likely relatead to alcohol use    Past Surgical History:  Procedure Laterality Date  . HAND SURGERY Left     Family History  Problem Relation Age of Onset  . CAD Mother        Died of MI 2s  . CAD Father        Died of MI 41s  . Diabetes Mellitus II Neg Hx     Social History   Socioeconomic  History  . Marital status: Single    Spouse name: Not on file  . Number of children: Not on file  . Years of education: Not on file  . Highest education level: Not on file  Occupational History  . Not on file  Tobacco Use  . Smoking status: Current Every Day Smoker    Packs/day: 0.50    Years: 45.00    Pack years: 22.50    Types: Cigarettes   . Smokeless tobacco: Never Used  Vaping Use  . Vaping Use: Never used  Substance and Sexual Activity  . Alcohol use: Yes    Comment: 1/2 pint a day   . Drug use: Not Currently  . Sexual activity: Not Currently  Other Topics Concern  . Not on file  Social History Narrative  . Not on file   Social Determinants of Health   Financial Resource Strain: Not on file  Food Insecurity: Not on file  Transportation Needs: Not on file  Physical Activity: Not on file  Stress: Not on file  Social Connections: Not on file  Intimate Partner Violence: Not on file    No Known Allergies  Current Facility-Administered Medications  Medication Dose Route Frequency Provider Last Rate Last Admin  . 0.9 %  sodium chloride infusion   Intravenous Continuous Leonie Douglas, MD      . ceFAZolin (ANCEF) IVPB 2g/100 mL premix  2 g Intravenous 30 min Pre-Op Leonie Douglas, MD      . chlorhexidine (PERIDEX) 0.12 % solution           . Chlorhexidine Gluconate Cloth 2 % PADS 6 each  6 each Topical Once Leonie Douglas, MD       And  . Chlorhexidine Gluconate Cloth 2 % PADS 6 each  6 each Topical Once Leonie Douglas, MD        REVIEW OF SYSTEMS:  [X]  denotes positive finding, [ ]  denotes negative finding Cardiac  Comments:  Chest pain or chest pressure:    Shortness of breath upon exertion:    Short of breath when lying flat:    Irregular heart rhythm:        Vascular    Pain in calf, thigh, or hip brought on by ambulation:    Pain in feet at night that wakes you up from your sleep:     Blood clot in your veins:    Leg swelling:         Pulmonary    Oxygen at home:    Productive cough:     Wheezing:         Neurologic    Sudden weakness in arms or legs:     Sudden numbness in arms or legs:     Sudden onset of difficulty speaking or slurred speech:    Temporary loss of vision in one eye:     Problems with dizziness:         Gastrointestinal    Blood in stool:     Vomited blood:          Genitourinary    Burning when urinating:     Blood in urine:        Psychiatric    Major depression:         Hematologic    Bleeding problems:    Problems with blood clotting too easily:        Skin    Rashes or ulcers:  Constitutional    Fever or chills:     PHYSICAL EXAM:   Vitals:   05/22/20 0618  BP: (!) 143/85  Pulse: 73  Resp: 18  Temp: (!) 97.5 F (36.4 C)  SpO2: 99%  Height: 5\' 1"  (1.549 m)    Constitutional: well appearing in no distress. Appears well nourished.  Neurologic: normal gait and station. CN intact. no weakness. no sensory loss. Psychiatric: Mood and affect symmetric and appropriate. Eyes: no icterus. no conjunctival pallor. Ears, nose, throat: mucous membranes moist. midline trachea.  Cardiac: regular rate and rhythm.  Respiratory: unlabored. Abdominal: soft, non-tender, non-distended. No scars across the abdomen.  Peripheral vascular:  Femoral pulse: L 2+ / R 2+ Extremity: No edema. No cyanosis. No pallor.  Skin: No gangrene. No ulceration.  Lymphatic: No Stemmer's sign. No palpable lymphadenopathy.  DATA REVIEW:    Most recent CBC CBC Latest Ref Rng & Units 05/18/2020 03/31/2020 03/16/2020  WBC 4.0 - 10.5 K/uL 8.9 11.0(H) 8.9  Hemoglobin 13.0 - 17.0 g/dL 13/12/2019 17.3(H) 16.5  Hematocrit 39.0 - 52.0 % 48.7 53.1(H) 49.9  Platelets 150 - 400 K/uL 220 316 329     Most recent CMP CMP Latest Ref Rng & Units 05/18/2020 04/09/2020 03/31/2020  Glucose 70 - 99 mg/dL 99 - 04/02/2020)  BUN 6 - 20 mg/dL 10 - 5(L)  Creatinine 681(L - 1.24 mg/dL 5.72 - 6.20  Sodium 3.55 - 145 mmol/L 137 - 138  Potassium 3.5 - 5.1 mmol/L 4.0 - 3.6  Chloride 98 - 111 mmol/L 98 - 100  CO2 22 - 32 mmol/L 29 - 24  Calcium 8.9 - 10.3 mg/dL 974 - 9.7  Total Protein 6.5 - 8.1 g/dL 7.4 7.5 8.0  Total Bilirubin 0.3 - 1.2 mg/dL 0.5 0.3 0.5  Alkaline Phos 38 - 126 U/L 124 138(H) 120  AST 15 - 41 U/L 29 23 34  ALT 0 - 44 U/L 29 12 17     Renal function Estimated  Creatinine Clearance: 75.4 mL/min (by C-G formula based on SCr of 0.65 mg/dL).  Hgb A1c MFr Bld (%)  Date Value  06/08/2018 5.6    LDL Chol Calc (NIH)  Date Value Ref Range Status  04/09/2020 101 (H) 0 - 99 mg/dL Final     Vascular Imaging: CTA personally reviewed 26mm infrarenal aneurysm with suitable infrarenal neck for clamp site Common iliac arteries appear relatively spared of atherosclerosis and appear suitable for distal clamp sites   14/06/2019. 47m, MD [facility] Phone Number: [phone] 05/22/2020 6:52 AM

## 2020-05-22 NOTE — Op Note (Signed)
DATE OF SERVICE: 05/22/2020  PATIENT:  Justin Poole  55 y.o. male  PRE-OPERATIVE DIAGNOSIS:  67mm infrarenal abdominal aortic aneurysm  POST-OPERATIVE DIAGNOSIS:  Same  PROCEDURE:   Open abdominal aortic aneurysm repair (64mm Dacron tube graft)  SURGEON:  Surgeon(s) and Role:    * Leonie Douglas, MD - Primary  ASSISTANT:     * Early, Kristen Loader, MD  An assistant was required to facilitate exposure and expedite the case.  ANESTHESIA:   general  EBL:  BLOOD ADMINISTERED:500 CC CELLSAVER  DRAINS: none   LOCAL MEDICATIONS USED:  none  SPECIMEN:  none  COUNTS: confirmed correct .  TOURNIQUET:  None  PATIENT DISPOSITION:  ICU - extubated and stable.   Delay start of Pharmacological VTE agent (>24hrs) due to surgical blood loss or risk of bleeding: no  INDICATION FOR PROCEDURE: OTTO Poole is a 55 y.o. male with large abdominal aortic aneurysm. He has adequate physiologic fitness for open repair. After careful discussion of risks, benefits, and alternatives he was offered either endovascular or open repair. He desired open repair. The patient understood the specific risks as documented in my H&P and wished to proceed.  OPERATIVE FINDINGS: adequate neck for infrarenal control. Distal control at common iliac arteries. Tube graft repair with 13mm Dacron. Hemostatic at completion.  DESCRIPTION OF PROCEDURE: After identification of the patient in the pre-operative holding area, the patient was transferred to the operating room. The patient was positioned supine on the operating room table. Anesthesia was induced. The abdomen was prepped and draped in standard fashion. A surgical pause was performed confirming correct patient, procedure, and operative location.  A generous midline laparotomy was made with a #10 blade from xiphoid to suprapubic abdomen. The incision was carried down through subcutaneous tissue to the linea alba. The midline fascia was divided. The abdomen was  entered sharply. The peritoneum was divided taking great care to avoid injury to the viscera.   The abdominal wall was protected using saline moistened blue towels. A balfour retractor was used to retract the abdominal wall. The transverse colon was eviscerated cranially and protected in a saline moistened blue towel. The small bowel was eviscerated to the right and protected in a saline moistened blue towel. The retroperitoneum was inspected and a large aneurysm identified. Dissection was carried down to the adventitia of the aorta. The exposure was carried cranially. The neck of the aneurysm was identified. The left renal artery was identified. The lower, right renal accessory artery was identified and preserved. The aorta was circumferentially exposed at the neck. The exposure was carried down caudally to the aortic bifurcation. The common iliac vessels were exposed nearly circumferentially.  The patient was heparinized. Activated clotting time measurements were used to confirm adequate heparinization. The common iliac arteries were clamped. The aorta was clamped. The aorta was opened. Two lumbar arteries required ligation with 2-O silk suture. The aorta was transected at the neck.   A 47mm Dacron tube graft was brought on to the field. The proximal anastomosis was sewn with a circumfirential reinforcing felt strip and 3-O prolene in running continuous fashion. The distal graft was clamped and the aortic neck clamp was released and hemostasis ensured at the anastomosis. One repair stitch was needed.  The graft was cut to size and the distal anastomosis performed using continuous running suture of 3-O prolene. Hemostasis was again confirmed.   Doppler flow was confirmed in the bilateral pedal vessels. Palpable femoral pulses were felt. Heparin was  reversed with protamine. Hemostasis was ensured at the surgical bed. The surgical bed was copiously irrigated. The retroperitoneum was closed using 2-O vicryl  to exclude the vascular graft from the peritoneal contents.   The viscera were returned to the abdomen. The rectus fascia was closed using interrupted O vicryl suture and dooble looped O PDS x 2. The subcutaneous tissue was closed with 3-O vicryl. The skin was closed with a subcuticular 4-O Monocryl. Dermabond was applied.   Upon completion of the case instrument and sharps counts were confirmed correct. The patient was transferred to the PACU in good condition. I was present for all portions of the procedure.  Rande Brunt. Lenell Antu, MD [facility] Phone Number: [phone] 05/22/2020 12:34 PM

## 2020-05-22 NOTE — Anesthesia Procedure Notes (Addendum)
Procedure Name: Intubation Date/Time: 05/22/2020 7:37 AM Performed by: Claris Che, CRNA Pre-anesthesia Checklist: Patient identified, Emergency Drugs available, Suction available, Patient being monitored and Timeout performed Patient Re-evaluated:Patient Re-evaluated prior to induction Oxygen Delivery Method: Circle system utilized Preoxygenation: Pre-oxygenation with 100% oxygen Induction Type: IV induction and Cricoid Pressure applied Ventilation: Mask ventilation without difficulty Laryngoscope Size: Mac and 4 Grade View: Grade II Tube type: Oral Tube size: 8.0 mm Number of attempts: 1 Airway Equipment and Method: Stylet Placement Confirmation: ETT inserted through vocal cords under direct vision,  positive ETCO2 and breath sounds checked- equal and bilateral Secured at: 23 cm Tube secured with: Tape Dental Injury: Teeth and Oropharynx as per pre-operative assessment

## 2020-05-22 NOTE — Anesthesia Postprocedure Evaluation (Signed)
Anesthesia Post Note  Patient: Justin Poole  Procedure(s) Performed: OPEN ABDOMINAL AORTIC ANEURYSM REPAIR WITH HEMASHIELD GRAFT (N/A Abdomen)     Patient location during evaluation: PACU Anesthesia Type: General Level of consciousness: awake and alert Pain management: pain level controlled Vital Signs Assessment: post-procedure vital signs reviewed and stable Respiratory status: spontaneous breathing, nonlabored ventilation, respiratory function stable and patient connected to nasal cannula oxygen Cardiovascular status: blood pressure returned to baseline and stable Postop Assessment: no apparent nausea or vomiting Anesthetic complications: no   No complications documented.  Last Vitals:  Vitals:   05/22/20 1450 05/22/20 1505  BP:    Pulse: 94 87  Resp: 12 12  Temp:    SpO2: 94% 95%    Last Pain:  Vitals:   05/22/20 1617  TempSrc:   PainSc: 2                  Shelton Silvas

## 2020-05-22 NOTE — Plan of Care (Signed)
  Problem: Bowel/Gastric: Goal: Gastrointestinal status for postoperative course will improve 05/22/2020 1803 by Evaristo Bury, RN Outcome: Progressing 05/22/2020 1543 by Evaristo Bury, RN Outcome: Progressing   Problem: Clinical Measurements: Goal: Postoperative complications will be avoided or minimized 05/22/2020 1803 by Evaristo Bury, RN Outcome: Progressing 05/22/2020 1543 by Evaristo Bury, RN Outcome: Progressing   Problem: Skin Integrity: Goal: Demonstration of wound healing without infection will improve 05/22/2020 1803 by Evaristo Bury, RN Outcome: Progressing 05/22/2020 1543 by Evaristo Bury, RN Outcome: Progressing

## 2020-05-22 NOTE — Anesthesia Procedure Notes (Signed)
Arterial Line Insertion Start/End1/14/2022 7:10 AM, 05/22/2020 7:12 AM Performed by: Shelton Silvas, MD, Rachel Moulds, CRNA, CRNA  Patient location: Pre-op. Preanesthetic checklist: patient identified, IV checked, site marked, risks and benefits discussed, surgical consent, monitors and equipment checked, pre-op evaluation, timeout performed and anesthesia consent Lidocaine 1% used for infiltration Left, radial was placed Catheter size: 20 Fr Hand hygiene performed  and maximum sterile barriers used   Attempts: 1 Procedure performed without using ultrasound guided technique. Following insertion, dressing applied and Biopatch. Post procedure assessment: normal and unchanged

## 2020-05-23 ENCOUNTER — Inpatient Hospital Stay (HOSPITAL_COMMUNITY): Payer: Medicaid Other

## 2020-05-23 DIAGNOSIS — J9601 Acute respiratory failure with hypoxia: Secondary | ICD-10-CM

## 2020-05-23 LAB — COMPREHENSIVE METABOLIC PANEL
ALT: 15 U/L (ref 0–44)
AST: 25 U/L (ref 15–41)
Albumin: 3.3 g/dL — ABNORMAL LOW (ref 3.5–5.0)
Alkaline Phosphatase: 61 U/L (ref 38–126)
Anion gap: 9 (ref 5–15)
BUN: 10 mg/dL (ref 6–20)
CO2: 26 mmol/L (ref 22–32)
Calcium: 8.8 mg/dL — ABNORMAL LOW (ref 8.9–10.3)
Chloride: 99 mmol/L (ref 98–111)
Creatinine, Ser: 0.86 mg/dL (ref 0.61–1.24)
GFR, Estimated: >60 mL/min (ref >60)
Glucose, Bld: 149 mg/dL — ABNORMAL HIGH (ref 70–99)
Potassium: 4.3 mmol/L (ref 3.5–5.1)
Sodium: 134 mmol/L — ABNORMAL LOW (ref 135–145)
Total Bilirubin: 0.5 mg/dL (ref 0.3–1.2)
Total Protein: 5.4 g/dL — ABNORMAL LOW (ref 6.5–8.1)

## 2020-05-23 LAB — POCT I-STAT 7, (LYTES, BLD GAS, ICA,H+H)
Acid-Base Excess: 7 mmol/L — ABNORMAL HIGH (ref 0.0–2.0)
Bicarbonate: 32.5 mmol/L — ABNORMAL HIGH (ref 20.0–28.0)
Calcium, Ion: 1.19 mmol/L (ref 1.15–1.40)
HCT: 33 % — ABNORMAL LOW (ref 39.0–52.0)
Hemoglobin: 11.2 g/dL — ABNORMAL LOW (ref 13.0–17.0)
O2 Saturation: 96 %
Patient temperature: 98.9
Potassium: 4.1 mmol/L (ref 3.5–5.1)
Sodium: 132 mmol/L — ABNORMAL LOW (ref 135–145)
TCO2: 34 mmol/L — ABNORMAL HIGH (ref 22–32)
pCO2 arterial: 51.7 mmHg — ABNORMAL HIGH (ref 32.0–48.0)
pH, Arterial: 7.407 (ref 7.350–7.450)
pO2, Arterial: 82 mmHg — ABNORMAL LOW (ref 83.0–108.0)

## 2020-05-23 LAB — CBC
HCT: 34.2 % — ABNORMAL LOW (ref 39.0–52.0)
Hemoglobin: 11.5 g/dL — ABNORMAL LOW (ref 13.0–17.0)
MCH: 32 pg (ref 26.0–34.0)
MCHC: 33.6 g/dL (ref 30.0–36.0)
MCV: 95.3 fL (ref 80.0–100.0)
Platelets: 166 10*3/uL (ref 150–400)
RBC: 3.59 MIL/uL — ABNORMAL LOW (ref 4.22–5.81)
RDW: 14.4 % (ref 11.5–15.5)
WBC: 15.8 10*3/uL — ABNORMAL HIGH (ref 4.0–10.5)
nRBC: 0 % (ref 0.0–0.2)

## 2020-05-23 LAB — MAGNESIUM: Magnesium: 1.9 mg/dL (ref 1.7–2.4)

## 2020-05-23 LAB — GLUCOSE, CAPILLARY: Glucose-Capillary: 152 mg/dL — ABNORMAL HIGH (ref 70–99)

## 2020-05-23 LAB — AMYLASE: Amylase: 68 U/L (ref 28–100)

## 2020-05-23 MED ORDER — FENTANYL CITRATE (PF) 100 MCG/2ML IJ SOLN
25.0000 ug | INTRAMUSCULAR | Status: DC | PRN
  Administered 2020-05-23 – 2020-05-24 (×5): 25 ug via INTRAVENOUS
  Filled 2020-05-23 (×5): qty 2

## 2020-05-23 MED ORDER — LEVALBUTEROL HCL 0.63 MG/3ML IN NEBU
0.6300 mg | INHALATION_SOLUTION | Freq: Three times a day (TID) | RESPIRATORY_TRACT | Status: DC
  Administered 2020-05-23 – 2020-05-28 (×13): 0.63 mg via RESPIRATORY_TRACT
  Filled 2020-05-23 (×13): qty 3

## 2020-05-23 MED ORDER — LEVALBUTEROL HCL 0.63 MG/3ML IN NEBU
INHALATION_SOLUTION | RESPIRATORY_TRACT | Status: AC
  Filled 2020-05-23: qty 3

## 2020-05-23 MED ORDER — LEVALBUTEROL HCL 0.63 MG/3ML IN NEBU
0.6300 mg | INHALATION_SOLUTION | Freq: Four times a day (QID) | RESPIRATORY_TRACT | Status: DC | PRN
  Administered 2020-05-23 – 2020-05-24 (×2): 0.63 mg via RESPIRATORY_TRACT
  Filled 2020-05-23: qty 3

## 2020-05-23 NOTE — Progress Notes (Signed)
eLink Physician-Brief Progress Note Patient Name: Justin Poole DOB: 06-01-1965 MRN: [id number]   Date of Service  05/23/2020  HPI/Events of Note  55 year old white male who presented to the hospital for a scheduled AAA repair that was performed on 22 May 2020.  Patient had open abdominal aortic aneurysm repair with a 13mm Dacron tube graft.  Estimated blood loss was 1250 mL.  Patient received 500 cc of Cell Saver.  Postop day 1 patient has been extubated and doing well on nasal cannula.  Central line and Foley catheter has been discontinued.  Through the evening of postop day 1 patient had increasing oxygen requirements in addition to increasing respiratory rate.  Chest x-ray was obtained and demonstrated left lung atelectasis.   PMH includes GERD, HTN, LBBB, pancreatitis.   Camera: HR 119, 123/79, sats ok on O2. Getting incentive spirometry by RT  Data; EKG: Normal sinus, LBBB ( old changes), qtc 451. 7.40/51/82/34 K 4.1, sod 132. Hg stable at 11.2 ECHO 04/2020:  EF 45%   eICU Interventions  - continue care - asp precautions - BP/HR control. On nicotine patch - SCd /sq heparin as VTE - BG goals < 180. - on protonix. - Dr Nicole Cella A/P reviewed.       Intervention Category Major Interventions: Other: Evaluation Type: New Patient Evaluation  [name] 05/23/2020, 9:56 PM

## 2020-05-23 NOTE — Plan of Care (Signed)
  Problem: Cardiac: Goal: Ability to maintain an adequate cardiac output will improve Outcome: Progressing   Problem: Skin Integrity: Goal: Demonstration of wound healing without infection will improve Outcome: Progressing   Problem: Clinical Measurements: Goal: Postoperative complications will be avoided or minimized Outcome: Progressing   Problem: Education: Goal: Knowledge of the prescribed therapeutic regimen will improve Outcome: Progressing

## 2020-05-23 NOTE — Progress Notes (Signed)
   Mr. Justin Poole is postoperative day 1 #1 from open abdominal aortic aneurysm repair.  Throughout the day he had increasing respiratory rate and oxygen requirement.  Chest x-ray demonstrates what appears to be left lung atelectasis as the underlying cause.  He is currently resting comfortably on BiPAP.  Critical care medicine evaluation and treatment of this patient much appreciated.  Brandon C. Randie Heinz, MD [facility] [place] Office: [phone] Pager: [phone]

## 2020-05-23 NOTE — Consult Note (Signed)
NAME:  Justin Poole, MRN:  [id number], DOB:  1965/08/30, LOS: 1 ADMISSION DATE:  05/22/2020, CONSULTATION DATE: 05/23/2020 REFERRING MD: Dr. Randie Heinz, CHIEF COMPLAINT: Shortness of breath  Brief History:  55 year old male status post AAA repair now with hypoxia  History of Present Illness:  [name] who presented to the hospital for a scheduled AAA repair that was performed on 22 May 2020.  Patient had open abdominal aortic aneurysm repair with a 54mm Dacron tube graft.  Estimated blood loss was 1250 mL.  Patient received 500 cc of Cell Saver.  Postop day 1 patient has been extubated and doing well on nasal cannula.  Central line and Foley catheter has been discontinued.  Through the evening of postop day 1 patient had increasing oxygen requirements in addition to increasing respiratory rate.  Chest x-ray was obtained and demonstrated left lung atelectasis.  Patient denies any chest pain/chest pressure/abdominal pain.  He is complaining of nausea.  Denies vomiting or diarrhea.  No radiculopathy of the left arm neck or jaw.  Patient states he is mildly short of breath.  No wheezing-subjective  Past Medical History:  5.7 cm AAA Asthma GERD Hypertension Left bundle branch block Alcohol related pancreatitis 06/2018  active smoker   Consults:  Critical care medicine  Procedures:  Open abdominal aortic aneurysm repair 05/09/2020  Significant Diagnostic Tests:    Micro Data:  COVID: Negative  Antimicrobials:  None    Objective   Blood pressure 129/82, pulse (!) 104, temperature 98.2 F (36.8 C), temperature source Axillary, resp. rate (!) 25, height 5' 0.98" (1.549 m), weight 55.1 kg, SpO2 91 %.    FiO2 (%):  [55 %-100 %] 100 %   Intake/Output Summary (Last 24 hours) at 05/23/2020 2132 Last data filed at 05/23/2020 2000 Gross per 24 hour  Intake 2087.31 ml  Output 2400 ml  Net -312.69 ml   Filed Weights   05/23/20 1106  Weight: 55.1 kg     Examination: General: No acute distress HENT: Atraumatic/normocephalic mucous membranes are moist Lungs: Diminished breath sounds on the left side improved slightly with patient deep breathing and coughing.  No significant wheezing or rhonchi noted. Cardiovascular: Regular rate no murmur or rub Abdomen: Soft, nondistended Extremities: Distal pulse intact x4.  No significant edema. Neuro: Conscious alert follows commands verbally interacted GU: Within normal    Assessment & Plan:  Postoperative atelectasis of the left lung leading to hypoxia Status post AAA repair with 5mm Dacron graft Active smoker History of asthma  Plan: Bedside echocardiogram was completed.  Ejection fraction appears to be 40 to 45%.  This is unchanged from prior exams.  There is no significant ventricular dilatation seen.  IVC appears dynamic.  No pericardial effusion seen. Most likely cause of patient's hypoxia is left lung atelectasis.  We will start with therapy and incentive spirometry.  If this does not help we will do a short course of BiPAP.  Continue neb therapy.  Discussed therapy with respiratory therapist as well as the patient. Patient is currently on 100% nonrebreather.  Will wean for oxygen saturations greater 92%. N.p.o. until respiratory status improves. Discontinue Dilaudid PCA.  This may be causing some of his nausea. Hold the Ativan at this time Use fentanyl 25 mics IV every 2 hours as needed abdominal pain  Best practice (evaluated daily)  Diet: N.p.o. until respiratory status improves Pain/Anxiety/Delirium protocol (if indicated): Fentanyl VAP protocol (if indicated): n/a DVT prophylaxis: Heparin subcu GI prophylaxis: Protonix Glucose control:  Monitor blood sugars Mobility:  Disposition: Continue to monitor in the intensive care unit  Goals of Care:   Code Status: full  Labs   CBC: Recent Labs  Lab 05/18/20 0925 05/22/20 1044 05/22/20 1115 05/22/20 1424 05/22/20 1605  05/23/20 0435 05/23/20 2005  WBC 8.9  --   --  15.7*  --  15.8*  --   HGB 15.8   < > 10.5* 12.7* 12.9* 11.5* 11.2*  HCT 48.7   < > 31.0* 39.2 38.0* 34.2* 33.0*  MCV 97.0  --   --  97.3  --  95.3  --   PLT 220  --   --  183  --  166  --    < > = values in this interval not displayed.    Basic Metabolic Panel: Recent Labs  Lab 05/18/20 0925 05/22/20 1044 05/22/20 1115 05/22/20 1424 05/22/20 1605 05/23/20 0435 05/23/20 2005  NA 137   < > 137 134* 135 134* 132*  K 4.0   < > 4.7 5.0 5.2* 4.3 4.1  CL 98  --   --  102  --  99  --   CO2 29  --   --  23  --  26  --   GLUCOSE 99  --   --  167*  --  149*  --   BUN 10  --   --  11  --  10  --   CREATININE 0.65  --   --  0.84  --  0.86  --   CALCIUM 10.3  --   --  8.5*  --  8.8*  --   MG  --   --   --  1.7  --  1.9  --    < > = values in this interval not displayed.   GFR: Estimated Creatinine Clearance: 70.1 mL/min (by C-G formula based on SCr of 0.86 mg/dL). Recent Labs  Lab 05/18/20 0925 05/22/20 1424 05/23/20 0435  WBC 8.9 15.7* 15.8*  LATICACIDVEN  --  1.5  --     Liver Function Tests: Recent Labs  Lab 05/18/20 0925 05/23/20 0435  AST 29 25  ALT 29 15  ALKPHOS 124 61  BILITOT 0.5 0.5  PROT 7.4 5.4*  ALBUMIN 4.0 3.3*   Recent Labs  Lab 05/23/20 0435  AMYLASE 68   No results for input(s): AMMONIA in the last 168 hours.  ABG    Component Value Date/Time   PHART 7.407 05/23/2020 2005   PCO2ART 51.7 (H) 05/23/2020 2005   PO2ART 82 (L) 05/23/2020 2005   HCO3 32.5 (H) 05/23/2020 2005   TCO2 34 (H) 05/23/2020 2005   ACIDBASEDEF 3.0 (H) 05/22/2020 1044   O2SAT 96.0 05/23/2020 2005     Coagulation Profile: Recent Labs  Lab 05/18/20 0925 05/22/20 1424  INR 0.9 1.1    Cardiac Enzymes: No results for input(s): CKTOTAL, CKMB, CKMBINDEX, TROPONINI in the last 168 hours.  HbA1C: Hgb A1c MFr Bld  Date/Time Value Ref Range Status  06/08/2018 07:04 PM 5.6 4.8 - 5.6 % Final    Comment:    (NOTE) Pre  diabetes:          5.7%-6.4% Diabetes:              >6.4% Glycemic control for   <7.0% adults with diabetes     CBG: Recent Labs  Lab 05/23/20 2036  GLUCAP 152*    Review of Systems:   10 point review of systems  completed Patient is currently complaining only of nausea.  Past Medical History:  He,  has a past medical history of AAA (abdominal aortic aneurysm) (HCC), Asthma, Dysrhythmia, GERD (gastroesophageal reflux disease), Hypertension, Left bundle branch block, and Pancreatitis.   Surgical History:   Past Surgical History:  Procedure Laterality Date  . HAND SURGERY Left      Social History:   reports that he has been smoking cigarettes. He has a 22.50 pack-year smoking history. He has never used smokeless tobacco. He reports current alcohol use. He reports previous drug use.   Family History:  His family history includes CAD in his father and mother. There is no history of Diabetes Mellitus II.   Allergies No Known Allergies   Home Medications  Prior to Admission medications   Medication Sig Start Date End Date Taking? Authorizing Provider  amLODipine (NORVASC) 5 MG tablet Take 1 tablet (5 mg total) by mouth daily. 04/07/20  Yes Lewayne Bunting, MD  Ca Carbonate-Mag Hydroxide (ROLAIDS PO) Take 1 tablet by mouth daily as needed (acid reflux).   Yes [provider]  nicotine (NICODERM CQ - DOSED IN MG/24 HOURS) 21 mg/24hr patch Place 21 mg onto the skin daily.   Yes [provider]  rosuvastatin (CRESTOR) 40 MG tablet Take 1 tablet (40 mg total) by mouth daily. 04/07/20 07/06/20 Yes Lewayne Bunting, MD     Critical care time: 50 minutes

## 2020-05-23 NOTE — Evaluation (Signed)
Physical Therapy Evaluation Patient Details Name: Justin Poole MRN: [id number] DOB: 07-Feb-1966 Today's Date: 05/23/2020   History of Present Illness  55 y.o. male with a infrarenal abdominal aortic aneurysm measuring 44mm in greatest orthogonal dimension. Other PMH includes GERD, HTN, LBBB, pancreatitis. Pt underwent Open abdominal aortic aneurysm repair on 05/22/2020.  Clinical Impression  Pt presents to PT with deficits in functional mobility, gait, balance, endurance, and is limited somewhat by abdominal pain this session. Pt requires some physical assistance to initially stabilize balance upon standing but otherwise mobilizes well with PT cues. Pt does demonstrate deficits in activity tolerance and will benefit from aggressive mobilization to improve gait quality and endurance. PT recommends HHPT and a RW at the time of discharge.    Follow Up Recommendations Home health PT;Supervision - Intermittent (may progress to no needs)    Equipment Recommendations  Rolling walker with 5" wheels (may not require DME at the time of discharge)    Recommendations for Other Services       Precautions / Restrictions Precautions Precautions: Fall Restrictions Weight Bearing Restrictions: No      Mobility  Bed Mobility Overal bed mobility: Needs Assistance Bed Mobility: Rolling;Sidelying to Sit Rolling: Supervision Sidelying to sit: Min guard       General bed mobility comments: PT cues for log roll to reduce discomfort    Transfers Overall transfer level: Needs assistance Equipment used: 1 person hand held assist Transfers: Sit to/from [employer] Sit to Stand: Min assist Stand pivot transfers: Min guard       General transfer comment: minA to steady initially  Ambulation/Gait Ambulation/Gait assistance: Min guard Gait Distance (Feet): 2 Feet Assistive device: 1 person hand held assist Gait Pattern/deviations: Shuffle Gait velocity: reduced Gait velocity  interpretation: <1.31 ft/sec, indicative of household ambulator General Gait Details: short steps turning from bed to recliner  Stairs            Wheelchair Mobility    Modified Rankin (Stroke Patients Only)       Balance Overall balance assessment: Needs assistance Sitting-balance support: No upper extremity supported;Feet supported Sitting balance-Leahy Scale: Fair     Standing balance support: Single extremity supported Standing balance-Leahy Scale: Poor Standing balance comment: reliant on UE support to steady                             Pertinent Vitals/Pain Pain Assessment: Faces Faces Pain Scale: Hurts whole lot Pain Location: abdomen Pain Descriptors / Indicators: Grimacing Pain Intervention(s): PCA encouraged    Home Living Family/patient expects to be discharged to:: Private residence Living Arrangements: Non-relatives/Friends (roommate) Available Help at Discharge: Friend(s);Available PRN/intermittently Type of Home: Apartment Home Access: Stairs to enter Entrance Stairs-Rails: Right Entrance Stairs-Number of Steps: 5 Home Layout: One level Home Equipment: None      Prior Function Level of Independence: Independent         Comments: does not work or Administrator, sports        Extremity/Trunk Assessment   Upper Extremity Assessment Upper Extremity Assessment: Defer to OT evaluation    Lower Extremity Assessment Lower Extremity Assessment: Overall WFL for tasks assessed    Cervical / Trunk Assessment Cervical / Trunk Assessment: Other exceptions Cervical / Trunk Exceptions: abdominal incision dry and intact  Communication   Communication: No difficulties  Cognition Arousal/Alertness: Awake/alert Behavior During Therapy: WFL for tasks assessed/performed (initially mildly agitated, becomes more appropriate with  progression of session) Overall Cognitive Status: Impaired/Different from baseline Area of Impairment:  Memory                     Memory: Decreased short-term memory                General Comments General comments (skin integrity, edema, etc.): pt hypertensive per A-line, 170-180 systolic BP, HR elevates with mobility but returns to 70-80s when resting in recliner. Pt on 2L [place] during session    Exercises     Assessment/Plan    PT Assessment Patient needs continued PT services  PT Problem List Decreased activity tolerance;Decreased balance;Decreased mobility;Decreased cognition;Decreased knowledge of use of DME;Decreased safety awareness;Decreased knowledge of precautions;Pain       PT Treatment Interventions DME instruction;Gait training;Stair training;Functional mobility training;Therapeutic activities;Therapeutic exercise;Balance training;Neuromuscular re-education;Patient/family education    PT Goals (Current goals can be found in the Care Plan section)  Acute Rehab PT Goals Patient Stated Goal: to return to independence and reduce pain PT Goal Formulation: With patient Time For Goal Achievement: 06/06/20 Potential to Achieve Goals: Good    Frequency Min 3X/week   Barriers to discharge        Co-evaluation               AM-PAC PT "6 Clicks" Mobility  Outcome Measure Help needed turning from your back to your side while in a flat bed without using bedrails?: A Little Help needed moving from lying on your back to sitting on the side of a flat bed without using bedrails?: A Little Help needed moving to and from a bed to a chair (including a wheelchair)?: A Little Help needed standing up from a chair using your arms (e.g., wheelchair or bedside chair)?: A Little Help needed to walk in hospital room?: A Little Help needed climbing 3-5 steps with a railing? : A Lot 6 Click Score: 17    End of Session Equipment Utilized During Treatment: Oxygen Activity Tolerance: Patient tolerated treatment well Patient left: in chair;with call bell/phone within  reach;with chair alarm set Nurse Communication: Mobility status PT Visit Diagnosis: Unsteadiness on feet (R26.81);Other abnormalities of gait and mobility (R26.89);Pain Pain - part of body:  (abdomen)    Time: 2993-7169 PT Time Calculation (min) (ACUTE ONLY): 22 min   Charges:   PT Evaluation $PT Eval Moderate Complexity: 1 Mod          Arlyss Gandy, PT, DPT Acute Rehabilitation Pager: [phone]   Arlyss Gandy 05/23/2020, 11:02 AM

## 2020-05-23 NOTE — Progress Notes (Signed)
RT placed patient on venti mask 14L 55% per sats. Patient tolerating well at this time sating 91%. RT will continue to monitor as needed.

## 2020-05-23 NOTE — Progress Notes (Signed)
  Progress Note    05/23/2020 8:40 AM 1 Day Post-Op  Subjective: Has mild abdominal pain  Vitals:   05/23/20 0720 05/23/20 0800  BP:  (!) 94/55  Pulse:  70  Resp:  15  Temp: 97.8 F (36.6 C)   SpO2:  93%    Physical Exam: Awake alert oriented Nonlabored respirations Midline abdominal incision with mild ecchymosis Bilateral common femoral and bilateral pedal pulses  CBC    Component Value Date/Time   WBC 15.8 (H) 05/23/2020 0435   RBC 3.59 (L) 05/23/2020 0435   HGB 11.5 (L) 05/23/2020 0435   HCT 34.2 (L) 05/23/2020 0435   PLT 166 05/23/2020 0435   MCV 95.3 05/23/2020 0435   MCH 32.0 05/23/2020 0435   MCHC 33.6 05/23/2020 0435   RDW 14.4 05/23/2020 0435   LYMPHSABS 1.1 03/16/2020 0814   MONOABS 0.9 03/16/2020 0814   EOSABS 0.1 03/16/2020 0814   BASOSABS 0.0 03/16/2020 0814    BMET    Component Value Date/Time   NA 134 (L) 05/23/2020 0435   K 4.3 05/23/2020 0435   CL 99 05/23/2020 0435   CO2 26 05/23/2020 0435   GLUCOSE 149 (H) 05/23/2020 0435   BUN 10 05/23/2020 0435   CREATININE 0.86 05/23/2020 0435   CALCIUM 8.8 (L) 05/23/2020 0435   GFRNONAA >60 05/23/2020 0435   GFRAA >60 11/24/2019 1220    INR    Component Value Date/Time   INR 1.1 05/22/2020 1424     Intake/Output Summary (Last 24 hours) at 05/23/2020 0840 Last data filed at 05/23/2020 0700 Gross per 24 hour  Intake 5748.93 ml  Output 4500 ml  Net 1248.93 ml     Assessment:  55 y.o. male is postop day 1 open abdominal aortic aneurysm repair with 2 graft  Plan: Out of bed today Sips and chips DC central line DC Foley catheter Potassium removed from IV fluids and potassium level within normal limits this morning Continue ICU monitoring today DVT prophylaxis: Subcutaneous heparin   Brandon C. Randie Heinz, MD [facility] Office: [phone] Pager: [phone]  05/23/2020 8:40 AM

## 2020-05-23 NOTE — Evaluation (Signed)
Occupational Therapy Evaluation Patient Details Name: Justin Poole MRN: [id number] DOB: 1965/05/16 Today's Date: 05/23/2020    History of Present Illness 55 y.o. male with a infrarenal abdominal aortic aneurysm measuring 11mm in greatest orthogonal dimension. Other PMH includes GERD, HTN, LBBB, pancreatitis. Pt underwent Open abdominal aortic aneurysm repair on 05/22/2020.   Clinical Impression   Patient admitted for the diagnosis and procedure above.  He was initially resistant to do anything, and became upset.  OT and PT encouraged up to the recliner and benefits associated with moving.  Patient eventually agreed.  Overall he is moving well, needing up to [facility] for bed mobility and Min A with basic transfers.  Abdominal discomfort and lack of sleep are the prevailing deficits.  He needs up to Mod A with lower body ADL, but OT anticipates as his pain lessens, his independence will return.  OT will continue to follow in the acute setting, but dies not anticipate any post acute needs.      Follow Up Recommendations  No OT follow up    Equipment Recommendations  Tub/shower seat    Recommendations for Other Services       Precautions / Restrictions Precautions Precautions: Fall Restrictions Weight Bearing Restrictions: No      Mobility Bed Mobility Overal bed mobility: Needs Assistance Bed Mobility: Rolling;Sidelying to Sit Rolling: Supervision Sidelying to sit: Min guard            Transfers Overall transfer level: Needs assistance Equipment used: 1 person hand held assist Transfers: Sit to/from [employer] Sit to Stand: Min assist Stand pivot transfers: Min guard            Balance Overall balance assessment: Needs assistance Sitting-balance support: No upper extremity supported;Feet supported Sitting balance-Leahy Scale: Fair     Standing balance support: Single extremity supported Standing balance-Leahy Scale: Poor Standing balance  comment: reliant on UE support to steady                           ADL either performed or assessed with clinical judgement   ADL Overall ADL's : Needs assistance/impaired                 Upper Body Dressing : Minimal assistance;Sitting   Lower Body Dressing: Moderate assistance;Sitting/lateral leans               Functional mobility during ADLs: Minimal assistance;Rolling walker       Vision Baseline Vision/History: Wears glasses Patient Visual Report: No change from baseline       Perception     Praxis      Pertinent Vitals/Pain Faces Pain Scale: Hurts even more Pain Location: abdomen Pain Descriptors / Indicators: Guarding;Grimacing Pain Intervention(s): Limited activity within patient's tolerance     Hand Dominance Right   Extremity/Trunk Assessment Upper Extremity Assessment Upper Extremity Assessment: Generalized weakness           Communication Communication Communication: No difficulties   Cognition Arousal/Alertness: Awake/alert Behavior During Therapy: WFL for tasks assessed/performed Overall Cognitive Status: Impaired/Different from baseline Area of Impairment: Memory                     Memory: Decreased short-term memory             General Comments   VVS    Exercises     Shoulder Instructions      Home Living Family/patient expects to be discharged  to:: Private residence Living Arrangements: Non-relatives/Friends Available Help at Discharge: Friend(s);Available PRN/intermittently Type of Home: Apartment Home Access: Stairs to enter   Entrance Stairs-Rails: Right Home Layout: One level     Bathroom Shower/Tub: Tub/shower unit;Walk-in shower   Bathroom Toilet: Standard     Home Equipment: None          Prior Functioning/Environment Level of Independence: Independent        Comments: does not work or drive        OT Problem List: Decreased strength;Decreased activity  tolerance;Impaired balance (sitting and/or standing);Decreased safety awareness;Pain      OT Treatment/Interventions: Self-care/ADL training;Therapeutic exercise;Energy conservation;DME and/or AE instruction;Balance training;Therapeutic activities    OT Goals(Current goals can be found in the care plan section) Acute Rehab OT Goals Patient Stated Goal: Right now I just want to rest OT Goal Formulation: With patient Time For Goal Achievement: 05/23/20 Potential to Achieve Goals: Good ADL Goals Pt Will Perform Lower Body Bathing: with set-up;sit to/from stand Pt Will Perform Lower Body Dressing: with set-up;sit to/from stand Pt Will Transfer to Toilet: with modified independence;ambulating;regular height toilet Pt Will Perform Toileting - Clothing Manipulation and hygiene: with modified independence;sit to/from stand  OT Frequency: Min 2X/week   Barriers to D/C:    none noted       Co-evaluation PT/OT/SLP Co-Evaluation/Treatment: Yes Reason for Co-Treatment: For patient/therapist safety;Complexity of the patient's impairments (multi-system involvement)   OT goals addressed during session: ADL's and self-care      AM-PAC OT "6 Clicks" Daily Activity     Outcome Measure Help from another person eating meals?: None Help from another person taking care of personal grooming?: A Little Help from another person toileting, which includes using toliet, bedpan, or urinal?: A Little Help from another person bathing (including washing, rinsing, drying)?: A Lot Help from another person to put on and taking off regular upper body clothing?: A Little Help from another person to put on and taking off regular lower body clothing?: A Lot 6 Click Score: 17   End of Session Equipment Utilized During Treatment: Rolling walker;Oxygen Nurse Communication: Mobility status  Activity Tolerance: Patient limited by fatigue Patient left: in chair;with call bell/phone within reach;with chair alarm  set  OT Visit Diagnosis: Unsteadiness on feet (R26.81);Muscle weakness (generalized) (M62.81);Pain                Time: 5631-4970 OT Time Calculation (min): 20 min Charges:  OT General Charges $OT Visit: 1 Visit OT Evaluation $OT Eval Moderate Complexity: 1 Mod  05/23/2020  Rich, OTR/L  Acute Rehabilitation Services  Office:  [phone]   Suzanna Obey 05/23/2020, 2:05 PM

## 2020-05-24 ENCOUNTER — Inpatient Hospital Stay (HOSPITAL_COMMUNITY): Payer: Medicaid Other

## 2020-05-24 ENCOUNTER — Encounter (HOSPITAL_COMMUNITY): Payer: Self-pay | Admitting: Vascular Surgery

## 2020-05-24 DIAGNOSIS — R0603 Acute respiratory distress: Secondary | ICD-10-CM

## 2020-05-24 DIAGNOSIS — J9589 Other postprocedural complications and disorders of respiratory system, not elsewhere classified: Secondary | ICD-10-CM

## 2020-05-24 LAB — CBC WITH DIFFERENTIAL/PLATELET
Abs Immature Granulocytes: 0.19 10*3/uL — ABNORMAL HIGH (ref 0.00–0.07)
Basophils Absolute: 0 10*3/uL (ref 0.0–0.1)
Basophils Relative: 0 %
Eosinophils Absolute: 0 10*3/uL (ref 0.0–0.5)
Eosinophils Relative: 0 %
HCT: 29.9 % — ABNORMAL LOW (ref 39.0–52.0)
Hemoglobin: 10 g/dL — ABNORMAL LOW (ref 13.0–17.0)
Immature Granulocytes: 1 %
Lymphocytes Relative: 3 %
Lymphs Abs: 0.8 10*3/uL (ref 0.7–4.0)
MCH: 32.3 pg (ref 26.0–34.0)
MCHC: 33.4 g/dL (ref 30.0–36.0)
MCV: 96.5 fL (ref 80.0–100.0)
Monocytes Absolute: 2.7 10*3/uL — ABNORMAL HIGH (ref 0.1–1.0)
Monocytes Relative: 11 %
Neutro Abs: 19.9 10*3/uL — ABNORMAL HIGH (ref 1.7–7.7)
Neutrophils Relative %: 85 %
Platelets: 161 10*3/uL (ref 150–400)
RBC: 3.1 MIL/uL — ABNORMAL LOW (ref 4.22–5.81)
RDW: 14.5 % (ref 11.5–15.5)
WBC: 23.5 10*3/uL — ABNORMAL HIGH (ref 4.0–10.5)
nRBC: 0 % (ref 0.0–0.2)

## 2020-05-24 LAB — BASIC METABOLIC PANEL
Anion gap: 11 (ref 5–15)
BUN: 19 mg/dL (ref 6–20)
CO2: 27 mmol/L (ref 22–32)
Calcium: 8.6 mg/dL — ABNORMAL LOW (ref 8.9–10.3)
Chloride: 95 mmol/L — ABNORMAL LOW (ref 98–111)
Creatinine, Ser: 0.8 mg/dL (ref 0.61–1.24)
GFR, Estimated: >60 mL/min (ref >60)
Glucose, Bld: 165 mg/dL — ABNORMAL HIGH (ref 70–99)
Potassium: 4.1 mmol/L (ref 3.5–5.1)
Sodium: 133 mmol/L — ABNORMAL LOW (ref 135–145)

## 2020-05-24 LAB — PROCALCITONIN: Procalcitonin: 0.28 ng/mL

## 2020-05-24 LAB — HEMOGLOBIN AND HEMATOCRIT, BLOOD
HCT: 27.1 % — ABNORMAL LOW (ref 39.0–52.0)
HCT: 25.7 % — ABNORMAL LOW (ref 39.0–52.0)
Hemoglobin: 8.8 g/dL — ABNORMAL LOW (ref 13.0–17.0)
Hemoglobin: 8.6 g/dL — ABNORMAL LOW (ref 13.0–17.0)

## 2020-05-24 LAB — OCCULT BLOOD X 1 CARD TO LAB, STOOL: Fecal Occult Bld: POSITIVE — AB

## 2020-05-24 MED ORDER — SODIUM CHLORIDE 0.9 % IV SOLN
80.0000 mg | Freq: Once | INTRAVENOUS | Status: AC
  Administered 2020-05-24: 80 mg via INTRAVENOUS
  Filled 2020-05-24: qty 80

## 2020-05-24 MED ORDER — SODIUM CHLORIDE 0.9 % IV SOLN
1.0000 g | INTRAVENOUS | Status: DC
  Administered 2020-05-24: 1 g via INTRAVENOUS
  Filled 2020-05-24: qty 10
  Filled 2020-05-24: qty 1

## 2020-05-24 MED ORDER — METOPROLOL TARTRATE 25 MG PO TABS
25.0000 mg | ORAL_TABLET | Freq: Two times a day (BID) | ORAL | Status: DC
  Administered 2020-05-24 – 2020-05-30 (×12): 25 mg via ORAL
  Filled 2020-05-24 (×12): qty 1

## 2020-05-24 MED ORDER — CIPROFLOXACIN IN D5W 400 MG/200ML IV SOLN
400.0000 mg | Freq: Two times a day (BID) | INTRAVENOUS | Status: DC
  Administered 2020-05-24 – 2020-05-25 (×2): 400 mg via INTRAVENOUS
  Filled 2020-05-24 (×3): qty 200

## 2020-05-24 MED ORDER — SODIUM CHLORIDE 0.9 % IV SOLN
8.0000 mg/h | INTRAVENOUS | Status: DC
  Administered 2020-05-24 – 2020-05-26 (×4): 8 mg/h via INTRAVENOUS
  Filled 2020-05-24 (×4): qty 80

## 2020-05-24 MED ORDER — SODIUM CHLORIDE 3 % IN NEBU
4.0000 mL | INHALATION_SOLUTION | Freq: Two times a day (BID) | RESPIRATORY_TRACT | Status: AC
  Administered 2020-05-24 – 2020-05-26 (×5): 4 mL via RESPIRATORY_TRACT
  Filled 2020-05-24 (×7): qty 4

## 2020-05-24 MED ORDER — PANTOPRAZOLE SODIUM 40 MG IV SOLR: 40.0000 mg | Freq: Two times a day (BID) | INTRAVENOUS | Status: DC

## 2020-05-24 MED ORDER — METRONIDAZOLE IN NACL 5-0.79 MG/ML-% IV SOLN
500.0000 mg | Freq: Four times a day (QID) | INTRAVENOUS | Status: DC
  Administered 2020-05-24 – 2020-05-25 (×4): 500 mg via INTRAVENOUS
  Filled 2020-05-24 (×4): qty 100

## 2020-05-24 MED ORDER — METOCLOPRAMIDE HCL 5 MG/ML IJ SOLN
5.0000 mg | Freq: Once | INTRAMUSCULAR | Status: AC
  Administered 2020-05-24: 5 mg via INTRAVENOUS
  Filled 2020-05-24: qty 2

## 2020-05-24 NOTE — Progress Notes (Signed)
Discussed signs of mild intestinal ischemia with primary, reasonable to change abx for peritonitis ppx.  Currently HD stable.  Myrla Halsted MD PCCM

## 2020-05-24 NOTE — Progress Notes (Signed)
eLink Physician-Brief Progress Note Patient Name: Justin Poole DOB: 18-Dec-1965 MRN: [id number]   Date of Service  05/24/2020  HPI/Events of Note  Vomited once. Earlier had zofran. S/p AAA. On pain meds. Fenta . NPO. AM labs pending.   eICU Interventions  Reglan 5 mg IV once for now. Follow Hg. Asp precautions.      Intervention Category Intermediate Interventions: Other:  [name] 05/24/2020, 6:20 AM

## 2020-05-24 NOTE — Progress Notes (Signed)
  Progress Note    05/24/2020 10:56 AM 2 Days Post-Op  Subjective: Doing better this morning, no issues with breathing.  He did have 2 bouts of emesis this morning states that his abdomen does not hurt.  Bowel movement last night.  Vitals:   05/24/20 0844 05/24/20 0848  BP:  126/82  Pulse:  (!) 112  Resp:  (!) 28  Temp:    SpO2: 99% 98%    Physical Exam: Awake alert oriented Nonlabored respirations Abdomen is soft incision clean dry intact Bilateral pedal pulses are palpable and feet are warm and well-perfused  CBC    Component Value Date/Time   WBC 23.5 (H) 05/24/2020 0602   RBC 3.10 (L) 05/24/2020 0602   HGB 10.0 (L) 05/24/2020 0602   HCT 29.9 (L) 05/24/2020 0602   PLT 161 05/24/2020 0602   MCV 96.5 05/24/2020 0602   MCH 32.3 05/24/2020 0602   MCHC 33.4 05/24/2020 0602   RDW 14.5 05/24/2020 0602   LYMPHSABS 0.8 05/24/2020 0602   MONOABS 2.7 (H) 05/24/2020 0602   EOSABS 0.0 05/24/2020 0602   BASOSABS 0.0 05/24/2020 0602    BMET    Component Value Date/Time   NA 133 (L) 05/24/2020 0602   K 4.1 05/24/2020 0602   CL 95 (L) 05/24/2020 0602   CO2 27 05/24/2020 0602   GLUCOSE 165 (H) 05/24/2020 0602   BUN 19 05/24/2020 0602   CREATININE 0.80 05/24/2020 0602   CALCIUM 8.6 (L) 05/24/2020 0602   GFRNONAA >60 05/24/2020 0602   GFRAA >60 11/24/2019 1220    INR    Component Value Date/Time   INR 1.1 05/22/2020 1424     Intake/Output Summary (Last 24 hours) at 05/24/2020 1056 Last data filed at 05/24/2020 0800 Gross per 24 hour  Intake 2248.23 ml  Output 1450 ml  Net 798.23 ml     Assessment:  55 y.o. male is s/p postop day 2 open aortic aneurysm repair.  Left lung atelectasis yesterday now with critical care medicine following currently on 10 L high flow nasal cannula.  Plan: KUB this morning. Appreciate critical care medicine evaluation and treatment of this patient. Out of bed as tolerates Subcutaneous heparin   Brandon C. Randie Heinz, MD [facility] Office: [phone] Pager: [phone]  05/24/2020 10:56 AM

## 2020-05-24 NOTE — Progress Notes (Addendum)
eLink Physician-Brief Progress Note Patient Name: OWAIN ECKERMAN DOB: 02-Mar-1966 MRN: [id number]   Date of Service  05/24/2020  HPI/Events of Note  Hgb was 8.8 at 4:43 today, they are scheduled q 8,  C/O RUQ pain, has had 3 moderate black stools. occult blood +.  Camera: Stable Vitals. On o2 via nasal canula.  Vascular surgeon is aware of abd pain, suspected isch bowel, KUB earlier no free air.  1. Anemia. S/p AAA repair/graft. Melena. - start Protonix gtt. Stop protonix oral. - stat Hg/Hct now.Type and screen ordered.   If dropping need, to get CTA of abdomen?/GI consultation   eICU Interventions  As above. Asp precautions     Intervention Category Intermediate Interventions: Other:  [name] 05/24/2020, 10:31 PM

## 2020-05-24 NOTE — Progress Notes (Signed)
eLink Physician-Brief Progress Note Patient Name: Justin Poole DOB: April 23, 1966 MRN: [id number]   Date of Service  05/24/2020  HPI/Events of Note  AM labs request.   eICU Interventions  BMP/CBC ordered     Intervention Category Minor Interventions: Routine modifications to care plan (e.g. PRN medications for pain, fever)  [name] 05/24/2020, 3:13 AM

## 2020-05-24 NOTE — Plan of Care (Signed)
  Problem: Cardiac: Goal: Ability to maintain an adequate cardiac output will improve Outcome: Progressing   Problem: Clinical Measurements: Goal: Postoperative complications will be avoided or minimized Outcome: Progressing   Problem: Respiratory: Goal: Respiratory status will improve Outcome: Progressing

## 2020-05-24 NOTE — Progress Notes (Signed)
   NAME:  Justin Poole, MRN:  [id number], DOB:  [birth year], LOS: 2 ADMISSION DATE:  05/22/2020, CONSULTATION DATE: 05/23/2020 REFERRING MD: Dr. Randie Heinz, CHIEF COMPLAINT: Shortness of breath  Brief History:  55 year old male status post AAA repair now with hypoxia  History of Present Illness:  [name] who presented to the hospital for a scheduled AAA repair that was performed on 22 May 2020.  Patient had open abdominal aortic aneurysm repair with a 64mm Dacron tube graft.  Estimated blood loss was 1250 mL.  Patient received 500 cc of Cell Saver.  Postop day 1 patient has been extubated and doing well on nasal cannula.  Central line and Foley catheter has been discontinued.  Through the evening of postop day 1 patient had increasing oxygen requirements in addition to increasing respiratory rate.  Chest x-ray was obtained and demonstrated left lung atelectasis.  Patient denies any chest pain/chest pressure/abdominal pain.  He is complaining of nausea.  Denies vomiting or diarrhea.  No radiculopathy of the left arm neck or jaw.  Patient states he is mildly short of breath.  No wheezing-subjective  Past Medical History:  5.7 cm AAA Asthma GERD Hypertension Left bundle branch block Alcohol related pancreatitis 06/2018  active smoker   Consults:  Critical care medicine  Procedures:  Open abdominal aortic aneurysm repair 05/09/2020  Significant Diagnostic Tests:    Micro Data:  COVID: Negative  Antimicrobials:  None    Subjective  No events overnight, feels better, breathing improved.  Objective   Blood pressure 126/82, pulse (!) 112, temperature 98.5 F (36.9 C), temperature source Axillary, resp. rate (!) 28, height 5' 0.98" (1.549 m), weight 55.1 kg, SpO2 98 %.    FiO2 (%):  [55 %-100 %] 80 %   Intake/Output Summary (Last 24 hours) at 05/24/2020 4098 Last data filed at 05/24/2020 0800 Gross per 24 hour  Intake 2248.23 ml  Output 1450 ml  Net 798.23 ml    Filed Weights   05/23/20 1106  Weight: 55.1 kg    Examination: Constitutional: no acute distress resting in bed  Eyes: EOMI, pupils equal Ears, nose, mouth, and throat: MMM, trachea midline Cardiovascular: tachycardic, ext warm Respiratory: diminished left base but weak inspiratory effort Gastrointestinal: soft, hypoactive BS Skin: No rashes, normal turgor Neurologic: moves all 4 ext to command Ext: +clubbing Psychiatric: RASS 0, answering questions appropriately  Worsening leukocytosis Hyponatremia  Assessment & Plan:  Postoperative hypoxemic respiratory failure due to atelectasis ?mucus plug vs. Splinting vs. Aspiration leading to hypoxia; worsening leukocytosis Status post AAA repair with 18mm Dacron graft Active smoker History of asthma  - Wean FiO2 - CPT vs PEP, hypertonic saline - Ceftriaxone, check Pct and tracheal aspirate if able - Start diet, DC D5 0.45NS, if resume would use LR given hyponatremia  Best practice (evaluated daily)  Diet: okay to do trial of clear liquid Pain/Anxiety/Delirium protocol (if indicated): try off PCA to improve secretion clearance VAP protocol (if indicated): n/a DVT prophylaxis: Heparin subcu GI prophylaxis: Protonix Glucose control: Monitor blood sugars Mobility: BR Disposition: Continue to monitor in the intensive care unit  Goals of Care:  Per primary Full code  Will follow with you Keep in ICU today, if improving fine for floor tomorrow Myrla Halsted MD PCCM

## 2020-05-24 NOTE — Progress Notes (Signed)
   I was called about patient having tarry bowel movement.  He was noted to have a bowel movement last night that was nonbloody.  KUB does not demonstrate any signs of blockage.  He does have left greater than right lower quadrant tenderness to palpation and a leukocytosis concerning for colonic ischemia.  We will continue to hydrate with supportive measures at this time.   Brandon C. Randie Heinz, MD [facility] Office: [phone] Pager: [phone]

## 2020-05-25 ENCOUNTER — Encounter (HOSPITAL_COMMUNITY)
Admission: RE | Disposition: A | Discharge status: Home / Self Care | Payer: Self-pay | Source: Ambulatory Visit | Attending: Vascular Surgery

## 2020-05-25 ENCOUNTER — Inpatient Hospital Stay (HOSPITAL_COMMUNITY): Payer: Medicaid Other

## 2020-05-25 ENCOUNTER — Encounter (HOSPITAL_COMMUNITY): Payer: Self-pay | Admitting: Anesthesiology

## 2020-05-25 DIAGNOSIS — K920 Hematemesis: Secondary | ICD-10-CM

## 2020-05-25 DIAGNOSIS — D62 Acute posthemorrhagic anemia: Secondary | ICD-10-CM

## 2020-05-25 DIAGNOSIS — T17500A Unspecified foreign body in bronchus causing asphyxiation, initial encounter: Secondary | ICD-10-CM

## 2020-05-25 DIAGNOSIS — K2100 Gastro-esophageal reflux disease with esophagitis, without bleeding: Secondary | ICD-10-CM

## 2020-05-25 DIAGNOSIS — R12 Heartburn: Secondary | ICD-10-CM

## 2020-05-25 HISTORY — PX: ESOPHAGOGASTRODUODENOSCOPY (EGD) WITH PROPOFOL: SHX5813

## 2020-05-25 LAB — CBC
HCT: 24.5 % — ABNORMAL LOW (ref 39.0–52.0)
Hemoglobin: 8.1 g/dL — ABNORMAL LOW (ref 13.0–17.0)
MCH: 32.4 pg (ref 26.0–34.0)
MCHC: 33.1 g/dL (ref 30.0–36.0)
MCV: 98 fL (ref 80.0–100.0)
Platelets: 166 10*3/uL (ref 150–400)
RBC: 2.5 MIL/uL — ABNORMAL LOW (ref 4.22–5.81)
RDW: 14.6 % (ref 11.5–15.5)
WBC: 19.3 10*3/uL — ABNORMAL HIGH (ref 4.0–10.5)
nRBC: 0 % (ref 0.0–0.2)

## 2020-05-25 LAB — BASIC METABOLIC PANEL
Anion gap: 7 (ref 5–15)
BUN: 17 mg/dL (ref 6–20)
CO2: 26 mmol/L (ref 22–32)
Calcium: 8.3 mg/dL — ABNORMAL LOW (ref 8.9–10.3)
Chloride: 102 mmol/L (ref 98–111)
Creatinine, Ser: 0.9 mg/dL (ref 0.61–1.24)
GFR, Estimated: >60 mL/min (ref >60)
Glucose, Bld: 104 mg/dL — ABNORMAL HIGH (ref 70–99)
Potassium: 4 mmol/L (ref 3.5–5.1)
Sodium: 135 mmol/L (ref 135–145)

## 2020-05-25 LAB — PREPARE RBC (CROSSMATCH)

## 2020-05-25 LAB — HEMOGLOBIN AND HEMATOCRIT, BLOOD
HCT: 30 % — ABNORMAL LOW (ref 39.0–52.0)
Hemoglobin: 9.8 g/dL — ABNORMAL LOW (ref 13.0–17.0)

## 2020-05-25 SURGERY — ESOPHAGOGASTRODUODENOSCOPY (EGD) WITH PROPOFOL
Anesthesia: Monitor Anesthesia Care

## 2020-05-25 MED ORDER — FENTANYL CITRATE (PF) 100 MCG/2ML IJ SOLN
100.0000 ug | Freq: Once | INTRAMUSCULAR | Status: AC
  Administered 2020-05-25: 100 ug via INTRAVENOUS

## 2020-05-25 MED ORDER — MIDAZOLAM HCL (PF) 5 MG/ML IJ SOLN
INTRAMUSCULAR | Status: AC
  Filled 2020-05-25: qty 2

## 2020-05-25 MED ORDER — HEPARIN SODIUM (PORCINE) 5000 UNIT/ML IJ SOLN
5000.0000 [IU] | Freq: Three times a day (TID) | INTRAMUSCULAR | Status: DC
  Administered 2020-05-25 – 2020-05-30 (×14): 5000 [IU] via SUBCUTANEOUS
  Filled 2020-05-25 (×14): qty 1

## 2020-05-25 MED ORDER — SODIUM CHLORIDE 0.9% IV SOLUTION: Freq: Once | INTRAVENOUS | Status: AC

## 2020-05-25 MED ORDER — SODIUM CHLORIDE 0.9 % IV SOLN: INTRAVENOUS | Status: DC

## 2020-05-25 MED ORDER — ROCURONIUM BROMIDE 100 MG/10ML IV SOLN: 100.0000 mg | Freq: Once | INTRAVENOUS | Status: DC

## 2020-05-25 MED ORDER — EPINEPHRINE 1 MG/10ML IJ SOSY
PREFILLED_SYRINGE | INTRAMUSCULAR | Status: AC
  Filled 2020-05-25: qty 10

## 2020-05-25 MED ORDER — ETOMIDATE 2 MG/ML IV SOLN
20.0000 mg | Freq: Once | INTRAVENOUS | Status: AC
  Administered 2020-05-25: 20 mg via INTRAVENOUS
  Filled 2020-05-25: qty 10

## 2020-05-25 MED ORDER — PROPOFOL 1000 MG/100ML IV EMUL
5.0000 ug/kg/min | INTRAVENOUS | Status: DC
  Administered 2020-05-25: 40 ug/kg/min via INTRAVENOUS
  Administered 2020-05-25: 50 ug/kg/min via INTRAVENOUS
  Administered 2020-05-26: 30 ug/kg/min via INTRAVENOUS
  Filled 2020-05-25 (×3): qty 100

## 2020-05-25 MED ORDER — NOREPINEPHRINE 4 MG/250ML-% IV SOLN
INTRAVENOUS | Status: AC
  Filled 2020-05-25: qty 250

## 2020-05-25 MED ORDER — ROCURONIUM BROMIDE 10 MG/ML (PF) SYRINGE
PREFILLED_SYRINGE | INTRAVENOUS | Status: AC
  Administered 2020-05-25: 80 mg
  Filled 2020-05-25: qty 10

## 2020-05-25 MED ORDER — FENTANYL CITRATE (PF) 100 MCG/2ML IJ SOLN
INTRAMUSCULAR | Status: AC
  Filled 2020-05-25: qty 4

## 2020-05-25 MED ORDER — FENTANYL 2500MCG IN NS 250ML (10MCG/ML) PREMIX INFUSION
0.0000 ug/h | INTRAVENOUS | Status: DC
  Administered 2020-05-25: 100 ug/h via INTRAVENOUS
  Filled 2020-05-25: qty 250

## 2020-05-25 SURGICAL SUPPLY — 15 items

## 2020-05-25 NOTE — Op Note (Signed)
[facility] Patient Name: Justin Poole Procedure Date : 05/25/2020 MRN: [id number] Attending MD: Starr LakeHenry L. Myrtie Neitheranis , MD Date of Birth: 08/12/1965 CSN: [id number] Age: [age] Admit Type: Inpatient Procedure:                Upper GI endoscopy Indications:              Heartburn (chronic), Coffee-ground emesis, Melena                            (Post-op open AAA repair) Providers:                Starr LakeHenry L. Myrtie Neitheranis, MD, Rogue JurySandy Andrews, RN, Charlett LangoMike                            Marshall, RN, Rosilyn MingsNichole Montalvo, Technician Referring MD:             Dr. Myrla Halstedan Smith [facility] Medicines:                Monitored Anesthesia Care (patient sedated with                            propofol while intubated in ICU. Bronchoscopy had                            just been performed to relieve bronchial mucus                            plugging) Complications:            No immediate complications. Estimated Blood Loss:     Estimated blood loss: none. Procedure:                Pre-Anesthesia Assessment:                           - Prior to the procedure, a History and Physical                            was performed, and patient medications and                            allergies were reviewed. The patient's tolerance of                            previous anesthesia was also reviewed. The risks                            and benefits of the procedure and the sedation                            options and risks were discussed with the patient.                            All questions were answered, and informed consent  was obtained. Prior Anticoagulants: The patient has                            taken no previous anticoagulant or antiplatelet                            agents. ASA Grade Assessment: IV - A patient with                            severe systemic disease that is a constant threat                            to life. After reviewing the risks and benefits,                             the patient was deemed in satisfactory condition to                            undergo the procedure.                           After obtaining informed consent, the endoscope was                            passed under direct vision. Throughout the                            procedure, the patient's blood pressure, pulse, and                            oxygen saturations were monitored continuously. The                            GIF-H190 ([id number] Olympus gastroscope was                            introduced through the mouth, and advanced to the                            second part of duodenum. The upper GI endoscopy was                            accomplished without difficulty. The patient                            tolerated the procedure well. Scope In: Scope Out: Findings:      LA Grade D (one or more mucosal breaks involving at least 75% of       esophageal circumference) esophagitis with no bleeding was found in the       entire esophagus. There were a few small flat black spots indicative of       recent bleeding.      The stomach was normal except for a few small spots of recent OGT  suction trauma.      The cardia and gastric fundus were normal on retroflexion.      The examined duodenum was normal. Impression:               - LA Grade D reflux esophagitis with no bleeding.                            This is severe chronic ulcerated reflux esophagitis                            and was the source of bleeding.                           - Normal stomach.                           - Normal examined duodenum.                           - No specimens collected. Recommendation:           - Soft diet.                           - Continue present medications. IV protonix until                            tomorrow AM, then 40 mg by mouth twice daily.                           - smoking cessation. (absolutely essential)                           - OK to  extubate from GI standpoint.                           - Keep head of bed elevated.                           Discontinue antibiotics. Procedure Code(s):        --- Professional ---                           [id number], Esophagogastroduodenoscopy, flexible,                            transoral; diagnostic, including collection of                            specimen(s) by brushing or washing, when performed                            (separate procedure) Diagnosis Code(s):        --- Professional ---                           K21.00, Gastro-esophageal reflux disease with  esophagitis, without bleeding                           R12, Heartburn                           K92.0, Hematemesis                           K92.1, Melena (includes Hematochezia) CPT copyright 2019 American Medical Association. All rights reserved. The codes documented in this report are preliminary and upon coder review may  be revised to meet current compliance requirements. Justin Steptoe L. Myrtie Neither, MD 05/25/2020 3:10:22 PM This report has been signed electronically. Number of Addenda: 0

## 2020-05-25 NOTE — Progress Notes (Signed)
eLink Physician-Brief Progress Note Patient Name: ADRIN JULIAN DOB: 08-26-1965 MRN: [id number]   Date of Service  05/25/2020  HPI/Events of Note  Hg dropping to 8.1, MAP soft, but stable as per bed side RN discussion.  Discussed with Dr Cristal Deer. also  eICU Interventions  - would transfuse 1 PRBC and follow Hg/H for now.  Vascular/GI to see today. No need for CTA abdomen , since if leak would have to be catastrophic bleed.      Intervention Category Intermediate Interventions: Diagnostic test evaluation  [name] 05/25/2020, 5:17 AM

## 2020-05-25 NOTE — Procedures (Signed)
Intubation Procedure Note  Justin Poole  [id number]  11/17/1965  Date:05/25/20  Time:2:15 PM   Provider Performing:Antha Niday C Katrinka Blazing    Procedure: Intubation (31500)  Indication(s) Respiratory Failure  Consent Risks of the procedure as well as the alternatives and risks of each were explained to the patient and/or caregiver.  Consent for the procedure was obtained and is signed in the bedside chart   Anesthesia Etomidate, Fentanyl, Rocuronium and Propofol   Time Out Verified patient identification, verified procedure, site/side was marked, verified correct patient position, special equipment/implants available, medications/allergies/relevant history reviewed, required imaging and test results available.   Sterile Technique Usual hand hygeine, masks, and gloves were used   Procedure Description Patient positioned in bed supine.  Sedation given as noted above.  Patient was intubated with endotracheal tube using Glidescope.  View was Grade 1 full glottis .  Number of attempts was 1.  Colorimetric CO2 detector was consistent with tracheal placement.   Complications/Tolerance None; patient tolerated the procedure well. Bronchoscope verified placement  EBL Minimal   Specimen(s) None

## 2020-05-25 NOTE — Plan of Care (Signed)
  Problem: Education: Goal: Knowledge of the prescribed therapeutic regimen will improve Outcome: Progressing   Problem: Bowel/Gastric: Goal: Gastrointestinal status for postoperative course will improve Outcome: Progressing   Problem: Cardiac: Goal: Ability to maintain an adequate cardiac output will improve Outcome: Progressing   Problem: Clinical Measurements: Goal: Postoperative complications will be avoided or minimized Outcome: Progressing   Problem: Respiratory: Goal: Respiratory status will improve Outcome: Progressing   Problem: Skin Integrity: Goal: Demonstration of wound healing without infection will improve Outcome: Progressing   Problem: Urinary Elimination: Goal: Ability to achieve and maintain adequate renal perfusion and functioning will improve Outcome: Progressing   Problem: Education: Goal: Knowledge of General Education information will improve Description: Including pain rating scale, medication(s)/side effects and non-pharmacologic comfort measures Outcome: Progressing   Problem: Health Behavior/Discharge Planning: Goal: Ability to manage health-related needs will improve Outcome: Progressing   Problem: Clinical Measurements: Goal: Ability to maintain clinical measurements within normal limits will improve Outcome: Progressing Goal: Will remain free from infection Outcome: Progressing Goal: Diagnostic test results will improve Outcome: Progressing Goal: Respiratory complications will improve Outcome: Progressing Goal: Cardiovascular complication will be avoided Outcome: Progressing   Problem: Activity: Goal: Risk for activity intolerance will decrease Outcome: Progressing   Problem: Nutrition: Goal: Adequate nutrition will be maintained Outcome: Progressing   Problem: Coping: Goal: Level of anxiety will decrease Outcome: Progressing   Problem: Elimination: Goal: Will not experience complications related to bowel motility Outcome:  Progressing Goal: Will not experience complications related to urinary retention Outcome: Progressing   Problem: Pain Managment: Goal: General experience of comfort will improve Outcome: Progressing   Problem: Safety: Goal: Ability to remain free from injury will improve Outcome: Progressing   Problem: Skin Integrity: Goal: Risk for impaired skin integrity will decrease Outcome: Progressing   Problem: Safety: Goal: Non-violent Restraint(s) Outcome: Progressing

## 2020-05-25 NOTE — Procedures (Signed)
Bronchoscopy Procedure Note  Justin Poole  [id number]  [birth year]  Date:05/25/20  Time:2:16 PM   Provider Performing:Blaike Newburn C Tamala Julian   Procedure(s):  Flexible bronchoscopy with bronchial alveolar lavage [phone])  Indication(s) Left lung atelectasis  Consent Risks of the procedure as well as the alternatives and risks of each were explained to the patient and/or caregiver.  Consent for the procedure was obtained and is signed in the bedside chart  Anesthesia See ETT note   Time Out Verified patient identification, verified procedure, site/side was marked, verified correct patient position, special equipment/implants available, medications/allergies/relevant history reviewed, required imaging and test results available.   Sterile Technique Usual hand hygiene, masks, gowns, and gloves were used   Procedure Description Bronchoscope advanced through endotracheal tube and into airway.  Airways were examined down to subsegmental level with findings noted below.   Following diagnostic evaluation, BAL(s) performed in LLL with normal saline and return of mucus-filled clear fluid and Therapeutic aspiration performed in left mainstem down to LUL/Lingula/LLL  Findings:  - Extensive tenacious mucus plugs extending from distal left mainstem downward successfully suctioned down to subsegmental level - BAL in LLL with return of mucus-plug filled fluid - Underlying chronic bronchiitic changes - No endobronchial lesions  Complications/Tolerance None; patient tolerated the procedure well. Chest X-ray is not needed post procedure.   EBL Minimal   Specimen(s) LLL BAL for culture

## 2020-05-25 NOTE — H&P (View-Only) (Signed)
Consultation  Referring Provider: Dr. Myrla Halsted Critical care     Primary Care Physician:  Pcp, No Primary Gastroenterologist:  Gentry Fitz       Reason for Consultation:   Hematemesis and melena, anemia         HPI:   Justin Poole is a 55 y.o. male status post open AAA repair on 05/22/2020 with question of ischemic colitis and lung collapse causing hypoxia, as well as chronic issues below, who we are consulted on today in regards to some coffee-ground emesis and melena with an acute drop in hemoglobin.    Per vascular surgeons notes today patient is hypoxic on 15 L of oxygen via nasal cannula.  Question of mucus plug vs other cause.    Today, patient is found resting, he easily awakens and tells me that "I just can never get sleep around here".  Explains that he had 1 episode of coffee-ground emesis yesterday morning around 6 or 7 and then had one episode of melena this morning.  Tells me that he was feeling nauseous yesterday but is no longer feeling nauseous today.  Does describe a chronic history of heartburn for which he takes Tums on an almost daily basis but has never seen a doctor for this.  Denies any previous endoscopic work-up. Denies current abdominal pain.     Patient tells me he is tired of procedures and "that is just not how my body works".  He would like to have today to recover at least and does not want to have anything done at this very moment.    Denies fever, chills, continued nausea or vomiting.     Past Medical History:  Diagnosis Date  . AAA (abdominal aortic aneurysm) (HCC)   . Asthma    as a child  . Dysrhythmia    pt states they have told him he "has an irregular heart beat"  . GERD (gastroesophageal reflux disease)   . Hypertension   . Left bundle branch block   . Pancreatitis    hospitalization 06/2018 for acute pancreatitis, likely relatead to alcohol use    Past Surgical History:  Procedure Laterality Date  . AORTA - BILATERAL FEMORAL ARTERY BYPASS  GRAFT N/A 05/22/2020   Procedure: OPEN ABDOMINAL AORTIC ANEURYSM REPAIR WITH HEMASHIELD GRAFT;  Surgeon: Leonie Douglas, MD;  Location: [facility] OR;  Service: Vascular;  Laterality: N/A;  . HAND SURGERY Left     Family History  Problem Relation Age of Onset  . CAD Mother        Died of MI 82s  . CAD Father        Died of MI 39s  . Diabetes Mellitus II Neg Hx     Social History   Tobacco Use  . Smoking status: Current Every Day Smoker    Packs/day: 0.50    Years: 45.00    Pack years: 22.50    Types: Cigarettes  . Smokeless tobacco: Never Used  Vaping Use  . Vaping Use: Never used  Substance Use Topics  . Alcohol use: Yes    Comment: 1/2 pint a day   . Drug use: Not Currently    Prior to Admission medications   Medication Sig Start Date End Date Taking? Authorizing Provider  amLODipine (NORVASC) 5 MG tablet Take 1 tablet (5 mg total) by mouth daily. 04/07/20  Yes Lewayne Bunting, MD  Ca Carbonate-Mag Hydroxide (ROLAIDS PO) Take 1 tablet by mouth daily as needed (acid  reflux).   Yes [provider]  nicotine (NICODERM CQ - DOSED IN MG/24 HOURS) 21 mg/24hr patch Place 21 mg onto the skin daily.   Yes [provider]  rosuvastatin (CRESTOR) 40 MG tablet Take 1 tablet (40 mg total) by mouth daily. 04/07/20 07/06/20 Yes Lewayne Bunting, MD    Current Facility-Administered Medications  Medication Dose Route Frequency Provider Last Rate Last Admin  . 0.9 %  sodium chloride infusion (Manually program via Guardrails IV Fluids)   Intravenous Once Josiah Lobo T, MD      . 0.9 %  sodium chloride infusion  500 mL Intravenous Once PRN Lars Mage, PA-C   Stopped at 05/22/20 1800  . acetaminophen (TYLENOL) tablet 325-650 mg  325-650 mg Oral Q4H PRN Clinton Gallant M, PA-C       Or  . acetaminophen (TYLENOL) suppository 325-650 mg  325-650 mg Rectal Q4H PRN Lars Mage, PA-C      . alum & mag hydroxide-simeth (MAALOX/MYLANTA) 200-200-20 MG/5ML suspension 15-30 mL   15-30 mL Oral Q2H PRN Clinton Gallant M, PA-C      . amLODipine (NORVASC) tablet 5 mg  5 mg Oral Daily Clinton Gallant M, PA-C   5 mg at 05/24/20 1018  . aspirin EC tablet 81 mg  81 mg Oral Q0600 Lars Mage, PA-C   81 mg at 05/25/20 1017  . bisacodyl (DULCOLAX) EC tablet 5 mg  5 mg Oral Daily PRN Lars Mage, PA-C      . Chlorhexidine Gluconate Cloth 2 % PADS 6 each  6 each Topical Daily Leonie Douglas, MD   6 each at 05/24/20 1019  . ciprofloxacin (CIPRO) IVPB 400 mg  400 mg Intravenous Q12H Maeola Harman, MD   Stopped at 05/25/20 [phone]  . docusate sodium (COLACE) capsule 100 mg  100 mg Oral Daily Clinton Gallant M, PA-C   100 mg at 05/24/20 1018  . fentaNYL (SUBLIMAZE) injection 25 mcg  25 mcg Intravenous Q2H PRN Kirtland Bouchard, MD   25 mcg at 05/24/20 1942  . guaiFENesin-dextromethorphan (ROBITUSSIN DM) 100-10 MG/5ML syrup 15 mL  15 mL Oral Q4H PRN Clinton Gallant M, PA-C      . heparin injection 5,000 Units  5,000 Units Subcutaneous Q8H Leonie Douglas, MD      . hydrALAZINE (APRESOLINE) injection 5 mg  5 mg Intravenous Q20 Min PRN Clinton Gallant M, PA-C   5 mg at 05/22/20 1432  . labetalol (NORMODYNE) injection 10 mg  10 mg Intravenous Q10 min PRN Lars Mage, PA-C   10 mg at 05/23/20 1056  . levalbuterol (XOPENEX) nebulizer solution 0.63 mg  0.63 mg Nebulization TID Leonie Douglas, MD   0.63 mg at 05/25/20 0845  . levalbuterol (XOPENEX) nebulizer solution 0.63 mg  0.63 mg Nebulization Q6H PRN Leonie Douglas, MD   0.63 mg at 05/24/20 0049  . LORazepam (ATIVAN) tablet 0.5 mg  0.5 mg Oral Q6H CollinsKara Mead M, PA-C   0.5 mg at 05/25/20 0359  . magnesium sulfate IVPB 2 g 50 mL  2 g Intravenous Daily PRN Clinton Gallant M, PA-C      . MEDLINE mouth rinse  15 mL Mouth Rinse BID Leonie Douglas, MD   15 mL at 05/24/20 2154  . metoprolol tartrate (LOPRESSOR) injection 2-5 mg  2-5 mg Intravenous Q2H PRN Clinton Gallant M, PA-C   3 mg at 05/24/20 0815  . metoprolol tartrate  (LOPRESSOR) tablet 25 mg  25 mg Oral BID Lorin GlassSmith, Daniel C, MD   25 mg at 05/24/20 2147  . metroNIDAZOLE (FLAGYL) IVPB 500 mg  500 mg Intravenous Q6H Maeola Harmanain, Brandon Christopher, MD   Stopped at 05/25/20 0602  . nicotine (NICODERM CQ - dosed in mg/24 hours) patch 21 mg  21 mg Transdermal Daily Clinton GallantCollins, Emma M, PA-C   21 mg at 05/24/20 1018  . ondansetron (ZOFRAN) injection 4 mg  4 mg Intravenous Q6H PRN Clinton Gallantollins, Emma M, PA-C   4 mg at 05/24/20 1223  . oxyCODONE-acetaminophen (PERCOCET/ROXICET) 5-325 MG per tablet 1-2 tablet  1-2 tablet Oral Q4H PRN Lars Mageollins, Emma M, PA-C   2 tablet at 05/25/20 0003  . pantoprazole (PROTONIX) 80 mg in sodium chloride 0.9 % 100 mL (0.8 mg/mL) infusion  8 mg/hr Intravenous Continuous Ranee GosselinMohan, Kinila T, MD 10 mL/hr at 05/25/20 0851 8 mg/hr at 05/25/20 0851  . phenol (CHLORASEPTIC) mouth spray 1 spray  1 spray Mouth/Throat PRN Clinton Gallantollins, Emma M, PA-C      . potassium chloride SA (KLOR-CON) CR tablet 20-40 mEq  20-40 mEq Oral Daily PRN Clinton Gallantollins, Emma M, PA-C      . rosuvastatin (CRESTOR) tablet 40 mg  40 mg Oral Daily Clinton GallantCollins, Emma M, PA-C   40 mg at 05/24/20 1018  . senna-docusate (Senokot-S) tablet 1 tablet  1 tablet Oral QHS PRN Clinton Gallantollins, Emma M, PA-C      . sodium chloride HYPERTONIC 3 % nebulizer solution 4 mL  4 mL Nebulization BID Lorin GlassSmith, Daniel C, MD   4 mL at 05/25/20 0845  . sodium phosphate (FLEET) 7-19 GM/118ML enema 1 enema  1 enema Rectal Once PRN Lars Mageollins, Emma M, PA-C        Allergies as of 04/29/2020  . (No Known Allergies)    Review of Systems:    Constitutional: No weight loss, fever or chills Skin: No rash Cardiovascular: No chest pain Respiratory: +SOB Gastrointestinal: See HPI and otherwise negative Genitourinary: No dysuria  Neurological: No headache, dizziness or syncope Musculoskeletal: No new muscle or joint pain Hematologic: No bruising Psychiatric: No history of depression or anxiety    Physical Exam:  Vital signs in last 24 hours: Temp:   [97.8 F (36.6 C)-100.1 F (37.8 C)] 98 F (36.7 C) (01/17 0932) Pulse Rate:  [83-115] 101 (01/17 0830) Resp:  [12-31] 23 (01/17 0830) BP: (78-129)/(49-80) 129/76 (01/17 0830) SpO2:  [89 %-100 %] 95 % (01/17 0845) FiO2 (%):  [40 %-60 %] 40 % (01/17 0845) Last BM Date: 05/24/20 General:   Pleasant ill appearing Caucasian male appears to be in NAD, Well developed, Well nourished, alert and cooperative Head:  Normocephalic and atraumatic. Eyes:   PEERL, EOMI. No icterus. Conjunctiva pink. Ears:  Normal auditory acuity. Neck:  Supple Throat: Oral cavity and pharynx without inflammation, swelling or lesion.  Lungs: Respirations even and somewhat labored.  No audible breath sounds on the left.   No wheezes, crackles, or rhonchi. +O2 via [place] (15 L) Heart: Normal S1, S2. No MRG. Regular rate and rhythm. No peripheral edema, cyanosis or pallor.  Abdomen:  Soft, nondistended, nontender. No rebound or guarding. Normal bowel sounds. No appreciable masses or hepatomegaly. Rectal:  Not performed.  Msk:  Symmetrical without gross deformities.  Extremities:  Without edema, no deformity or joint abnormality.  Neurologic:  Alert and  oriented x4;  grossly normal neurologically.  Skin:   Dry and intact without significant lesions or rashes. Psychiatric: Demonstrates good judgement and reason without abnormal affect or behaviors.  LAB RESULTS: Recent Labs    05/23/20 0435 05/23/20 2005 05/24/20 0602 05/24/20 1643 05/24/20 2301 05/25/20 0140  WBC 15.8*  --  23.5*  --   --  19.3*  HGB 11.5*   < > 10.0* 8.8* 8.6* 8.1*  HCT 34.2*   < > 29.9* 27.1* 25.7* 24.5*  PLT 166  --  161  --   --  166   < > = values in this interval not displayed.   BMET Recent Labs    05/23/20 0435 05/23/20 2005 05/24/20 0602 05/25/20 0140  NA 134* 132* 133* 135  K 4.3 4.1 4.1 4.0  CL 99  --  95* 102  CO2 26  --  27 26  GLUCOSE 149*  --  165* 104*  BUN 10  --  19 17  CREATININE 0.86  --  0.80 0.90  CALCIUM 8.8*   --  8.6* 8.3*   LFT Recent Labs    05/23/20 0435  PROT 5.4*  ALBUMIN 3.3*  AST 25  ALT 15  ALKPHOS 61  BILITOT 0.5   PT/INR Recent Labs    05/22/20 1424  LABPROT 13.6  INR 1.1    STUDIES: DG Chest 1 View  Result Date: 05/25/2020 CLINICAL DATA:  Atelectasis EXAM: CHEST  1 VIEW COMPARISON:  May 23, 2020 FINDINGS: There is complete collapse of the left lung with shift of heart and mediastinum toward the left. Right lung is clear. Heart size appears stable with heart upper normal in size. Pulmonary vascularity on the right appears unremarkable. Pulmonary vascular on the left is obscured. There is aortic atherosclerosis. No bone lesions. IMPRESSION: Apparent complete collapse of the left lung. Suspect mucous plug given abrupt termination of lucency in the proximal left main bronchus. There may be pleural effusions superimposed on the left. Right lung hyperexpanded clear.  Grossly normal cardiac silhouette. Aortic Atherosclerosis (ICD10-I70.0). These results will be called to the ordering clinician or representative by the Radiologist Assistant, and communication documented in the PACS or [employer] Dashboard. Electronically Signed   By: Bretta BangWilliam  Woodruff III M.D.   On: 05/25/2020 08:04   DG Abd 1 View  Result Date: 05/24/2020 CLINICAL DATA:  Peripheral artery disease EXAM: ABDOMEN - 1 VIEW COMPARISON:  05/22/2020 abdominal radiograph FINDINGS: No dilated small bowel loops. Mild colonic gas and stool. No evidence of pneumatosis or pneumoperitoneum. Surgical clips overlie the left upper lumbar spine. No radiopaque nephrolithiasis. Moderate lumbar spondylosis. Healed deformity in the lateral right ninth rib. IMPRESSION: Nonobstructive bowel gas pattern. Electronically Signed   By: Delbert PhenixJason A Poff M.D.   On: 05/24/2020 11:21   DG CHEST PORT 1 VIEW  Result Date: 05/23/2020 CLINICAL DATA:  Hypoxia. EXAM: PORTABLE CHEST 1 VIEW COMPARISON:  May 23, 2020. FINDINGS: Stable cardiomediastinal  silhouette. Mediastinal shift to the left is noted with increased left lung opacity concerning for atelectasis or pneumonia and associated volume loss. Some degree of pleural effusion may be present as well. Right lung is clear all although hyperexpanded. No pneumothorax is noted. Bony thorax is unremarkable. IMPRESSION: Increased left lung opacity is noted concerning for atelectasis or pneumonia and associated volume loss. Some degree of pleural effusion may be present as well. Right lung is clear. Electronically Signed   By: Lupita RaiderJames  Green Jr M.D.   On: 05/23/2020 20:04    Impression / Plan:   Impression: 1.  Coffee-ground emesis and melena: Nurse reports 1 episode of coffee-ground emesis yesterday morning and then 1 episode of melena today, hemoglobin has  fallen 10--> 8.8 yesterday evening--> 8.1 currently 2.  Acute anemia 3.  Status post AAA repair on 05/22/2020 4.  Acute respiratory failure: On 12 L nasal cannula, thought due to atelectasis versus mucous plug  Plan: 1.  Agree with PPI drip 2.  Patient is on heparin, this would need to be held prior to any procedures 3.  Dr. Myrtie Neither to weigh in on when this procedure would be appropriate, may need intervention for his respiratory status first.  Patient requests that we do not do any procedures today.  Did discuss with him that if he has further signs of acute GI bleed we may need to intervene sooner rather than later. 4.  Continue to monitor hemoglobin and transfusion as needed less than 7. 5.  Please await final recommendations from Dr. Myrtie Neither later today.  Thank you for your kind consultation, we will continue to follow.  Violet Baldy [facility]  05/25/2020, 9:52 AM  I have reviewed the entire case in detail with the above APP and discussed the plan in detail.  Therefore, I agree with the diagnoses recorded above. In addition,  I have personally interviewed and examined the patient and have personally reviewed any abdominal/pelvic CT scan  images.  My additional thoughts are as follows:  I saw this patient along with Dr. Myrla Halsted of pulmonary/critical care, and explained the situation in detail.  My concern is for upper GI bleeding in the postoperative state.  He has chronic heartburn, unknown if he might have esophageal or gastric ulcer disease.  He is a longtime smoker as well. I described the nature of an upper endoscopy along with risks and benefits and Mr. Cuny was agreeable after calling a friend or family member and discussing it with them.  The benefits and risks of the planned procedure were described in detail with the patient or (when appropriate) their health care proxy.  Risks were outlined as including, but not limited to, bleeding, infection, perforation, adverse medication reaction leading to cardiac or pulmonary decompensation, pancreatitis (if ERCP).  The limitation of incomplete mucosal visualization was also discussed.  No guarantees or warranties were given.  Patient at increased risk for cardiopulmonary complications of procedure due to medical comorbidities.  This will be coordinated with pulmonary later this afternoon.  The patient will be electively intubated in the ICU, then have his bedside bronchoscopy to relieve the upper airway obstruction (suspected mucous plug), immediately followed by upper endoscopy by me.  This patient is on a Protonix drip and currently receiving PRBCs.  He is still on ciprofloxacin and metronidazole from yesterday when the clinical concern was ischemic colitis.  This will most likely be discontinued after upper endoscopy if source of bleeding is found there.  Charlie Pitter III [phone]

## 2020-05-25 NOTE — Anesthesia Preprocedure Evaluation (Deleted)
Anesthesia Evaluation    Reviewed: Allergy & Precautions, Patient's Chart, lab work & pertinent test results  History of Anesthesia Complications Negative for: history of anesthetic complications  Airway        Dental  (+) Poor Dentition,    Pulmonary asthma , Current Smoker and Patient abstained from smoking.,           Cardiovascular hypertension, Pt. on medications + Peripheral Vascular Disease and +CHF       Neuro/Psych negative neurological ROS  negative psych ROS   GI/Hepatic Neg liver ROS, GERD  ,  Endo/Other    Renal/GU negative Renal ROS     Musculoskeletal negative musculoskeletal ROS (+)   Abdominal Normal abdominal exam  (+)   Peds  Hematology   Anesthesia Other Findings   Reproductive/Obstetrics                             Anesthesia Physical  Anesthesia Plan  ASA: III  Anesthesia Plan: MAC   Post-op Pain Management:    Induction: Intravenous  PONV Risk Score and Plan: 1 and Propofol infusion  Airway Management Planned: Natural Airway  Additional Equipment:   Intra-op Plan:   Post-operative Plan:   Informed Consent:   Plan Discussed with:   Anesthesia Plan Comments: (    Echo: 1. Left ventricular ejection fraction, by estimation, is 40 to 45%. The  left ventricle has mildly decreased function. The left ventricle  demonstrates regional wall motion abnormalities (see scoring  diagram/findings for description). Left ventricular  diastolic parameters are consistent with Grade I diastolic dysfunction  (impaired relaxation). There is hypokinesis of the left ventricular,  basal-mid inferolateral wall and inferior wall.  2. Right ventricular systolic function is normal. The right ventricular  size is normal.  3. The mitral valve is normal in structure. Trivial mitral valve  regurgitation.  4. The aortic valve is tricuspid. There is mild calcification of  the  aortic valve. Aortic valve regurgitation is trivial)        Anesthesia Quick Evaluation

## 2020-05-25 NOTE — Progress Notes (Signed)
   NAME:  Justin Poole, MRN:  [id number], DOB:  1966/01/18, LOS: 3 ADMISSION DATE:  05/22/2020, CONSULTATION DATE: 05/23/2020 REFERRING MD: Dr. Randie Heinz, CHIEF COMPLAINT: Shortness of breath  Brief History:  55 year old male status post AAA repair now with hypoxia  History of Present Illness:  [name] who presented to the hospital for a scheduled AAA repair that was performed on 22 May 2020.  Patient had open abdominal aortic aneurysm repair with a 9mm Dacron tube graft.  Estimated blood loss was 1250 mL.  Patient received 500 cc of Cell Saver.  Postop day 1 patient has been extubated and doing well on nasal cannula.  Central line and Foley catheter has been discontinued.  Through the evening of postop day 1 patient had increasing oxygen requirements in addition to increasing respiratory rate.  Chest x-ray was obtained and demonstrated left lung atelectasis.  Patient denies any chest pain/chest pressure/abdominal pain.  He is complaining of nausea.  Denies vomiting or diarrhea.  No radiculopathy of the left arm neck or jaw.  Patient states he is mildly short of breath.  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 Breathing improved.  Objective   Blood pressure (!) 107/54, pulse 89, temperature 98.2 F (36.8 C), temperature source Oral, resp. rate 14, height 5' 0.98" (1.549 m), weight 55.1 kg, SpO2 99 %.    FiO2 (%):  [50 %-80 %] 60 %   Intake/Output Summary (Last 24 hours) at 05/25/2020 7902 Last data filed at 05/25/2020 4097 Gross per 24 hour  Intake 1219.72 ml  Output 1250 ml  Net  -30.28 ml   Filed Weights   05/23/20 1106  Weight: 55.1 kg    Examination: Constitutional: no acute distress  Eyes: EOMI, pupils equal Ears, nose, mouth, and throat: MMM, trachea midline Cardiovascular: RRR, ext warm Respiratory: RRR, ext warm Gastrointestinal: Soft, +BS, TTP LLQ Skin: No rashes, normal turgor Neurologic: moves all 4 ext to command Psychiatric: RASS 0, fair insight  WBC improved H/H 10>>8.1  Assessment & Plan:  Postoperative hypoxemic respiratory failure due to atelectasis ?mucus plug vs. Splinting vs. Aspiration leading to hypoxia; improved Status post AAA repair with 67mm Dacron graft Active smoker History of asthma ?coffee ground emesis per nursing, melena- ?UGIB vs. Ischemic colitis  - Wean FiO2 to sats > 90% - CPT vs PEP, hypertonic saline, recheck CXR - Cipro/flagyl for peritonitis ppx, should cover most lung organisms as well - 1 unit pRBC ordered, trend H/H - Will ask GI to see later today to consider EGD - PPI gtt fine although intermittent pushes are just as effective  Best practice (evaluated daily)  Diet NPO except sips Pain/Anxiety/Delirium protocol (if indicated): PRNs VAP protocol (if indicated): n/a DVT prophylaxis: SCDs given possible active GIB GI prophylaxis: Protonix Glucose control: Monitor blood sugars Mobility: BR Disposition: Continue to monitor in the intensive care unit  Goals of Care:  Per primary Full code  Will follow with you  Myrla Halsted MD PCCM

## 2020-05-25 NOTE — Progress Notes (Signed)
PCCM Note  Still pretty high A-a gradient Will let rest tonight and extubate in AM. Will update sister  Myrla Halsted MD PCCM

## 2020-05-25 NOTE — Progress Notes (Signed)
eLink Physician-Brief Progress Note Patient Name: Justin Poole DOB: 12/30/1965 MRN: [id number]   Date of Service  05/25/2020  HPI/Events of Note  Inquiry about shifting Lopressor to IV and regarding due dose of heparin subcutaneous held this morning BP 95/58  HR 91 No recurrence of bleed  eICU Interventions  Will hold lopressor for now given borderline BP Ok to give prophylactic dose heparin tonight.      Intervention Category Intermediate Interventions: Other:  Darl Pikes 05/25/2020, 8:21 PM

## 2020-05-25 NOTE — Progress Notes (Signed)
eLink Physician-Brief Progress Note Patient Name: GRAM SIEDLECKI DOB: [birth year] MRN: [id number]   Date of Service  05/25/2020  HPI/Events of Note  H/A came back with Hgb 8.6, had another BM- black and runny this time. 10 to 8.8 to 8.6. Pain ok now as per bed side RN. protonix gtt started. MAP 66, HR 104. Not on pressors.   eICU Interventions  - watch for now. Follow Hg closely. Will transfuse if < 8 or hypotension.  Get Hg at 2 AM.      Intervention Category Intermediate Interventions: Other:  [name] 05/25/2020, 12:52 AM

## 2020-05-25 NOTE — Interval H&P Note (Signed)
History and Physical Interval Note:  05/25/2020 2:33 PM  Justin Poole  has presented today for surgery, with the diagnosis of Hematemesis.  The various methods of treatment have been discussed with the patient and family. After consideration of risks, benefits and other options for treatment, the patient has consented to  Procedure(s): ESOPHAGOGASTRODUODENOSCOPY (EGD) WITH PROPOFOL (N/A) as a surgical intervention.  The patient's history has been reviewed, patient examined, no change in status, stable for surgery.  I have reviewed the patient's chart and labs.  Questions were answered to the patient's satisfaction.    I spoke with Dr. Katrinka Blazing - bronchoscopy successfully removed mucus plugging.  We are proceeding with EGD.  Charlie Pitter III

## 2020-05-25 NOTE — Progress Notes (Signed)
   ASSESSMENT & PLAN:  Justin Poole is a 55 y.o. male s/p open AAA repair; likely ischemic colitis; atelectasis causing hypoxia.   CNS: PRN pain control. PT/OT/OOB. CV: Keep SBP 100-160.  Resp: Hypoxic on 15L [place]. Likely from atelectasis. Continue pulmonary toilet.  GI: Likely ischemic colitis. Agree with GI evaluation. Continue supportive care with IVF, ABx. FEN/GU: Continue IVF. Electrolytes OK - replete PRN. Start TPN if NPO x 8 days. Heme: Hgb 8.1. Agree with 1u PRBC.  ID: Likely ischemic colitis. Continue cipro / flagyl. Await GI opinion. May need pulmonary coverage as well. Defer to PCCM. Endo: Keep BG 120-180.  Prophy: SQH / SCDs. Pepcid.  Dispo: ICU today.   SUBJECTIVE:  Feeling better    OBJECTIVE:  BP (!) 107/54   Pulse 89   Temp 98.2 F (36.8 C) (Oral)   Resp 14   Ht 5' 0.98" (1.549 m)   Wt 55.1 kg   SpO2 99%   BMI 22.95 kg/m   Intake/Output Summary (Last 24 hours) at 05/25/2020 0731 Last data filed at 05/25/2020 2025 Gross per 24 hour  Intake 1219.72 ml  Output 1250 ml  Net -30.28 ml    Constitutional: well appearing. no acute distress. Cardiac: RRR. Vascular: warm feet Pulmonary: unlabored on [place] Abdominal: soft, appropriate post op abdomen. Incisional tenderness. No LLQ tenderness.  CBC Latest Ref Rng & Units 05/25/2020 05/24/2020 05/24/2020  WBC 4.0 - 10.5 K/uL 19.3(H) - -  Hemoglobin 13.0 - 17.0 g/dL 8.1(L) 8.6(L) 8.8(L)  Hematocrit 39.0 - 52.0 % 24.5(L) 25.7(L) 27.1(L)  Platelets 150 - 400 K/uL 166 - -     CMP Latest Ref Rng & Units 05/25/2020 05/24/2020 05/23/2020  Glucose 70 - 99 mg/dL 427(C) 623(J) -  BUN 6 - 20 mg/dL 17 19 -  Creatinine 6.28 - 1.24 mg/dL 3.15 1.76 -  Sodium 160 - 145 mmol/L 135 133(L) 132(L)  Potassium 3.5 - 5.1 mmol/L 4.0 4.1 4.1  Chloride 98 - 111 mmol/L 102 95(L) -  CO2 22 - 32 mmol/L 26 27 -  Calcium 8.9 - 10.3 mg/dL 8.3(L) 8.6(L) -  Total Protein 6.5 - 8.1 g/dL - - -  Total Bilirubin 0.3 - 1.2 mg/dL - - -  Alkaline Phos  38 - 126 U/L - - -  AST 15 - 41 U/L - - -  ALT 0 - 44 U/L - - -    Estimated Creatinine Clearance: 67 mL/min (by C-G formula based on SCr of 0.9 mg/dL).  Rande Brunt. Lenell Antu, MD [facility] Phone Number: [phone] 05/25/2020 7:31 AM

## 2020-05-25 NOTE — Progress Notes (Signed)
Physical Therapy Treatment Patient Details Name: Justin Poole MRN: [id number] DOB: [birth year] Today's Date: 05/25/2020    History of Present Illness 55 y.o. male with a infrarenal abdominal aortic aneurysm measuring 53mm in greatest orthogonal dimension. Other PMH includes GERD, HTN, LBBB, pancreatitis. Pt underwent Open abdominal aortic aneurysm repair on 05/22/2020. Respiratory issues 1/16 and 1/17 and pt placed on Heated HFNC.    PT Comments    Pt admitted with above diagnosis. Pt was limited to OOB to chair as he is on heated HFNC.  Pt was able to transfer to chair with min guard to min assist. Desat to 83% but recovered within 2 minutes to >90%.  Nurse aware.  Will continue to progress pt as able.  Pt currently with functional limitations due to balance and endurance deficits. Pt will benefit from skilled PT to increase their independence and safety with mobility to allow discharge to the venue listed below.     Follow Up Recommendations  Home health PT;Supervision - Intermittent (may progress to no needs)     Equipment Recommendations  Rolling walker with 5" wheels (may not require DME at the time of discharge)    Recommendations for Other Services       Precautions / Restrictions Precautions Precautions: Fall Restrictions Weight Bearing Restrictions: No    Mobility  Bed Mobility Overal bed mobility: Needs Assistance Bed Mobility: Rolling;Sidelying to Sit Rolling: Supervision Sidelying to sit: Min guard       General bed mobility comments: PT cues for log roll to reduce discomfort  Transfers Overall transfer level: Needs assistance Equipment used: 2 person hand held assist Transfers: Sit to/from [employer] Sit to Stand: Min assist Stand pivot transfers: Min guard       General transfer comment: minA to steady initially  Ambulation/Gait Ambulation/Gait assistance: Min guard Gait Distance (Feet): 2 Feet Assistive device: 2 person hand held  assist Gait Pattern/deviations: Shuffle Gait velocity: reduced Gait velocity interpretation: <1.31 ft/sec, indicative of household ambulator General Gait Details: short steps turning from bed to recliner.  Limited by lines of heatd HFNC to walk.  Pt desat with transfer but recovered quickly.   Stairs             Wheelchair Mobility    Modified Rankin (Stroke Patients Only)       Balance Overall balance assessment: Needs assistance Sitting-balance support: No upper extremity supported;Feet supported Sitting balance-Leahy Scale: Fair     Standing balance support: Single extremity supported Standing balance-Leahy Scale: Poor Standing balance comment: reliant on UE support to steady                            Cognition Arousal/Alertness: Awake/alert Behavior During Therapy: WFL for tasks assessed/performed Overall Cognitive Status: Impaired/Different from baseline Area of Impairment: Memory                     Memory: Decreased short-term memory                Exercises General Exercises - Lower Extremity Ankle Circles/Pumps: AROM;Both;10 reps;Supine Long Arc Quad: AROM;Both;10 reps;Supine    General Comments General comments (skin integrity, edema, etc.): Heated HFNC on 40%FiO2 with 30L/min with O2 sats dropping to 84% with transfer but it recovered to 93% once settled in chair      Pertinent Vitals/Pain Pain Assessment: Faces Faces Pain Scale: Hurts even more Pain Location: abdomen Pain Descriptors / Indicators: Guarding;Grimacing Pain  Intervention(s): Limited activity within patient's tolerance;Monitored during session;Repositioned    Home Living                      Prior Function            PT Goals (current goals can now be found in the care plan section) Progress towards PT goals: Progressing toward goals    Frequency    Min 3X/week      PT Plan      Co-evaluation              AM-PAC PT "6  Clicks" Mobility   Outcome Measure  Help needed turning from your back to your side while in a flat bed without using bedrails?: A Little Help needed moving from lying on your back to sitting on the side of a flat bed without using bedrails?: A Little Help needed moving to and from a bed to a chair (including a wheelchair)?: A Little Help needed standing up from a chair using your arms (e.g., wheelchair or bedside chair)?: A Little Help needed to walk in hospital room?: A Little Help needed climbing 3-5 steps with a railing? : A Lot 6 Click Score: 17    End of Session Equipment Utilized During Treatment: Oxygen;Gait belt Activity Tolerance: Patient tolerated treatment well Patient left: in chair;with call bell/phone within reach Nurse Communication: Mobility status PT Visit Diagnosis: Unsteadiness on feet (R26.81);Other abnormalities of gait and mobility (R26.89);Pain Pain - part of body:  (abdomen)     Time: 1540-0867 PT Time Calculation (min) (ACUTE ONLY): 22 min  Charges:  $Therapeutic Activity: 8-22 mins                     Dawn W,PT Acute Rehabilitation Services Pager:  [phone]  Office:  [phone]     Berline Lopes 05/25/2020, 1:33 PM

## 2020-05-25 NOTE — Consult Note (Addendum)
Consultation  Referring Provider: Dr. Myrla Halsted Critical care     Primary Care Physician:  Pcp, No Primary Gastroenterologist:  Gentry Fitz       Reason for Consultation:   Hematemesis and melena, anemia         HPI:   Justin Poole is a 55 y.o. male status post open AAA repair on 05/22/2020 with question of ischemic colitis and lung collapse causing hypoxia, as well as chronic issues below, who we are consulted on today in regards to some coffee-ground emesis and melena with an acute drop in hemoglobin.    Per vascular surgeons notes today patient is hypoxic on 15 L of oxygen via nasal cannula.  Question of mucus plug vs other cause.    Today, patient is found resting, he easily awakens and tells me that "I just can never get sleep around here".  Explains that he had 1 episode of coffee-ground emesis yesterday morning around 6 or 7 and then had one episode of melena this morning.  Tells me that he was feeling nauseous yesterday but is no longer feeling nauseous today.  Does describe a chronic history of heartburn for which he takes Tums on an almost daily basis but has never seen a doctor for this.  Denies any previous endoscopic work-up. Denies current abdominal pain.     Patient tells me he is tired of procedures and "that is just not how my body works".  He would like to have today to recover at least and does not want to have anything done at this very moment.    Denies fever, chills, continued nausea or vomiting.     Past Medical History:  Diagnosis Date  . AAA (abdominal aortic aneurysm) (HCC)   . Asthma    as a child  . Dysrhythmia    pt states they have told him he "has an irregular heart beat"  . GERD (gastroesophageal reflux disease)   . Hypertension   . Left bundle branch block   . Pancreatitis    hospitalization 06/2018 for acute pancreatitis, likely relatead to alcohol use    Past Surgical History:  Procedure Laterality Date  . AORTA - BILATERAL FEMORAL ARTERY BYPASS  GRAFT N/A 05/22/2020   Procedure: OPEN ABDOMINAL AORTIC ANEURYSM REPAIR WITH HEMASHIELD GRAFT;  Surgeon: Leonie Douglas, MD;  Location: [facility] OR;  Service: Vascular;  Laterality: N/A;  . HAND SURGERY Left     Family History  Problem Relation Age of Onset  . CAD Mother        Died of MI 82s  . CAD Father        Died of MI 39s  . Diabetes Mellitus II Neg Hx     Social History   Tobacco Use  . Smoking status: Current Every Day Smoker    Packs/day: 0.50    Years: 45.00    Pack years: 22.50    Types: Cigarettes  . Smokeless tobacco: Never Used  Vaping Use  . Vaping Use: Never used  Substance Use Topics  . Alcohol use: Yes    Comment: 1/2 pint a day   . Drug use: Not Currently    Prior to Admission medications   Medication Sig Start Date End Date Taking? Authorizing Provider  amLODipine (NORVASC) 5 MG tablet Take 1 tablet (5 mg total) by mouth daily. 04/07/20  Yes Lewayne Bunting, MD  Ca Carbonate-Mag Hydroxide (ROLAIDS PO) Take 1 tablet by mouth daily as needed (acid  reflux).   Yes [provider]  nicotine (NICODERM CQ - DOSED IN MG/24 HOURS) 21 mg/24hr patch Place 21 mg onto the skin daily.   Yes [provider]  rosuvastatin (CRESTOR) 40 MG tablet Take 1 tablet (40 mg total) by mouth daily. 04/07/20 07/06/20 Yes Lewayne Bunting, MD    Current Facility-Administered Medications  Medication Dose Route Frequency Provider Last Rate Last Admin  . 0.9 %  sodium chloride infusion (Manually program via Guardrails IV Fluids)   Intravenous Once Josiah Lobo T, MD      . 0.9 %  sodium chloride infusion  500 mL Intravenous Once PRN Lars Mage, PA-C   Stopped at 05/22/20 1800  . acetaminophen (TYLENOL) tablet 325-650 mg  325-650 mg Oral Q4H PRN Clinton Gallant M, PA-C       Or  . acetaminophen (TYLENOL) suppository 325-650 mg  325-650 mg Rectal Q4H PRN Lars Mage, PA-C      . alum & mag hydroxide-simeth (MAALOX/MYLANTA) 200-200-20 MG/5ML suspension 15-30 mL   15-30 mL Oral Q2H PRN Clinton Gallant M, PA-C      . amLODipine (NORVASC) tablet 5 mg  5 mg Oral Daily Clinton Gallant M, PA-C   5 mg at 05/24/20 1018  . aspirin EC tablet 81 mg  81 mg Oral Q0600 Lars Mage, PA-C   81 mg at 05/25/20 1017  . bisacodyl (DULCOLAX) EC tablet 5 mg  5 mg Oral Daily PRN Lars Mage, PA-C      . Chlorhexidine Gluconate Cloth 2 % PADS 6 each  6 each Topical Daily Leonie Douglas, MD   6 each at 05/24/20 1019  . ciprofloxacin (CIPRO) IVPB 400 mg  400 mg Intravenous Q12H Maeola Harman, MD   Stopped at 05/25/20 [phone]  . docusate sodium (COLACE) capsule 100 mg  100 mg Oral Daily Clinton Gallant M, PA-C   100 mg at 05/24/20 1018  . fentaNYL (SUBLIMAZE) injection 25 mcg  25 mcg Intravenous Q2H PRN Kirtland Bouchard, MD   25 mcg at 05/24/20 1942  . guaiFENesin-dextromethorphan (ROBITUSSIN DM) 100-10 MG/5ML syrup 15 mL  15 mL Oral Q4H PRN Clinton Gallant M, PA-C      . heparin injection 5,000 Units  5,000 Units Subcutaneous Q8H Leonie Douglas, MD      . hydrALAZINE (APRESOLINE) injection 5 mg  5 mg Intravenous Q20 Min PRN Clinton Gallant M, PA-C   5 mg at 05/22/20 1432  . labetalol (NORMODYNE) injection 10 mg  10 mg Intravenous Q10 min PRN Lars Mage, PA-C   10 mg at 05/23/20 1056  . levalbuterol (XOPENEX) nebulizer solution 0.63 mg  0.63 mg Nebulization TID Leonie Douglas, MD   0.63 mg at 05/25/20 0845  . levalbuterol (XOPENEX) nebulizer solution 0.63 mg  0.63 mg Nebulization Q6H PRN Leonie Douglas, MD   0.63 mg at 05/24/20 0049  . LORazepam (ATIVAN) tablet 0.5 mg  0.5 mg Oral Q6H CollinsKara Mead M, PA-C   0.5 mg at 05/25/20 0359  . magnesium sulfate IVPB 2 g 50 mL  2 g Intravenous Daily PRN Clinton Gallant M, PA-C      . MEDLINE mouth rinse  15 mL Mouth Rinse BID Leonie Douglas, MD   15 mL at 05/24/20 2154  . metoprolol tartrate (LOPRESSOR) injection 2-5 mg  2-5 mg Intravenous Q2H PRN Clinton Gallant M, PA-C   3 mg at 05/24/20 0815  . metoprolol tartrate  (LOPRESSOR) tablet 25 mg  25 mg Oral BID Lorin GlassSmith, Daniel C, MD   25 mg at 05/24/20 2147  . metroNIDAZOLE (FLAGYL) IVPB 500 mg  500 mg Intravenous Q6H Maeola Harmanain, Brandon Christopher, MD   Stopped at 05/25/20 0602  . nicotine (NICODERM CQ - dosed in mg/24 hours) patch 21 mg  21 mg Transdermal Daily Clinton GallantCollins, Emma M, PA-C   21 mg at 05/24/20 1018  . ondansetron (ZOFRAN) injection 4 mg  4 mg Intravenous Q6H PRN Clinton Gallantollins, Emma M, PA-C   4 mg at 05/24/20 1223  . oxyCODONE-acetaminophen (PERCOCET/ROXICET) 5-325 MG per tablet 1-2 tablet  1-2 tablet Oral Q4H PRN Lars Mageollins, Emma M, PA-C   2 tablet at 05/25/20 0003  . pantoprazole (PROTONIX) 80 mg in sodium chloride 0.9 % 100 mL (0.8 mg/mL) infusion  8 mg/hr Intravenous Continuous Ranee GosselinMohan, Kinila T, MD 10 mL/hr at 05/25/20 0851 8 mg/hr at 05/25/20 0851  . phenol (CHLORASEPTIC) mouth spray 1 spray  1 spray Mouth/Throat PRN Clinton Gallantollins, Emma M, PA-C      . potassium chloride SA (KLOR-CON) CR tablet 20-40 mEq  20-40 mEq Oral Daily PRN Clinton Gallantollins, Emma M, PA-C      . rosuvastatin (CRESTOR) tablet 40 mg  40 mg Oral Daily Clinton GallantCollins, Emma M, PA-C   40 mg at 05/24/20 1018  . senna-docusate (Senokot-S) tablet 1 tablet  1 tablet Oral QHS PRN Clinton Gallantollins, Emma M, PA-C      . sodium chloride HYPERTONIC 3 % nebulizer solution 4 mL  4 mL Nebulization BID Lorin GlassSmith, Daniel C, MD   4 mL at 05/25/20 0845  . sodium phosphate (FLEET) 7-19 GM/118ML enema 1 enema  1 enema Rectal Once PRN Lars Mageollins, Emma M, PA-C        Allergies as of 04/29/2020  . (No Known Allergies)    Review of Systems:    Constitutional: No weight loss, fever or chills Skin: No rash Cardiovascular: No chest pain Respiratory: +SOB Gastrointestinal: See HPI and otherwise negative Genitourinary: No dysuria  Neurological: No headache, dizziness or syncope Musculoskeletal: No new muscle or joint pain Hematologic: No bruising Psychiatric: No history of depression or anxiety    Physical Exam:  Vital signs in last 24 hours: Temp:   [97.8 F (36.6 C)-100.1 F (37.8 C)] 98 F (36.7 C) (01/17 0932) Pulse Rate:  [83-115] 101 (01/17 0830) Resp:  [12-31] 23 (01/17 0830) BP: (78-129)/(49-80) 129/76 (01/17 0830) SpO2:  [89 %-100 %] 95 % (01/17 0845) FiO2 (%):  [40 %-60 %] 40 % (01/17 0845) Last BM Date: 05/24/20 General:   Pleasant ill appearing Caucasian male appears to be in NAD, Well developed, Well nourished, alert and cooperative Head:  Normocephalic and atraumatic. Eyes:   PEERL, EOMI. No icterus. Conjunctiva pink. Ears:  Normal auditory acuity. Neck:  Supple Throat: Oral cavity and pharynx without inflammation, swelling or lesion.  Lungs: Respirations even and somewhat labored.  No audible breath sounds on the left.   No wheezes, crackles, or rhonchi. +O2 via [place] (15 L) Heart: Normal S1, S2. No MRG. Regular rate and rhythm. No peripheral edema, cyanosis or pallor.  Abdomen:  Soft, nondistended, nontender. No rebound or guarding. Normal bowel sounds. No appreciable masses or hepatomegaly. Rectal:  Not performed.  Msk:  Symmetrical without gross deformities.  Extremities:  Without edema, no deformity or joint abnormality.  Neurologic:  Alert and  oriented x4;  grossly normal neurologically.  Skin:   Dry and intact without significant lesions or rashes. Psychiatric: Demonstrates good judgement and reason without abnormal affect or behaviors.  LAB RESULTS: Recent Labs    05/23/20 0435 05/23/20 2005 05/24/20 0602 05/24/20 1643 05/24/20 2301 05/25/20 0140  WBC 15.8*  --  23.5*  --   --  19.3*  HGB 11.5*   < > 10.0* 8.8* 8.6* 8.1*  HCT 34.2*   < > 29.9* 27.1* 25.7* 24.5*  PLT 166  --  161  --   --  166   < > = values in this interval not displayed.   BMET Recent Labs    05/23/20 0435 05/23/20 2005 05/24/20 0602 05/25/20 0140  NA 134* 132* 133* 135  K 4.3 4.1 4.1 4.0  CL 99  --  95* 102  CO2 26  --  27 26  GLUCOSE 149*  --  165* 104*  BUN 10  --  19 17  CREATININE 0.86  --  0.80 0.90  CALCIUM 8.8*   --  8.6* 8.3*   LFT Recent Labs    05/23/20 0435  PROT 5.4*  ALBUMIN 3.3*  AST 25  ALT 15  ALKPHOS 61  BILITOT 0.5   PT/INR Recent Labs    05/22/20 1424  LABPROT 13.6  INR 1.1    STUDIES: DG Chest 1 View  Result Date: 05/25/2020 CLINICAL DATA:  Atelectasis EXAM: CHEST  1 VIEW COMPARISON:  May 23, 2020 FINDINGS: There is complete collapse of the left lung with shift of heart and mediastinum toward the left. Right lung is clear. Heart size appears stable with heart upper normal in size. Pulmonary vascularity on the right appears unremarkable. Pulmonary vascular on the left is obscured. There is aortic atherosclerosis. No bone lesions. IMPRESSION: Apparent complete collapse of the left lung. Suspect mucous plug given abrupt termination of lucency in the proximal left main bronchus. There may be pleural effusions superimposed on the left. Right lung hyperexpanded clear.  Grossly normal cardiac silhouette. Aortic Atherosclerosis (ICD10-I70.0). These results will be called to the ordering clinician or representative by the Radiologist Assistant, and communication documented in the PACS or [facility] Dashboard. Electronically Signed   By: William  Woodruff III M.D.   On: 05/25/2020 08:04   DG Abd 1 View  Result Date: 05/24/2020 CLINICAL DATA:  Peripheral artery disease EXAM: ABDOMEN - 1 VIEW COMPARISON:  05/22/2020 abdominal radiograph FINDINGS: No dilated small bowel loops. Mild colonic gas and stool. No evidence of pneumatosis or pneumoperitoneum. Surgical clips overlie the left upper lumbar spine. No radiopaque nephrolithiasis. Moderate lumbar spondylosis. Healed deformity in the lateral right ninth rib. IMPRESSION: Nonobstructive bowel gas pattern. Electronically Signed   By: Jason A Poff M.D.   On: 05/24/2020 11:21   DG CHEST PORT 1 VIEW  Result Date: 05/23/2020 CLINICAL DATA:  Hypoxia. EXAM: PORTABLE CHEST 1 VIEW COMPARISON:  May 23, 2020. FINDINGS: Stable cardiomediastinal  silhouette. Mediastinal shift to the left is noted with increased left lung opacity concerning for atelectasis or pneumonia and associated volume loss. Some degree of pleural effusion may be present as well. Right lung is clear all although hyperexpanded. No pneumothorax is noted. Bony thorax is unremarkable. IMPRESSION: Increased left lung opacity is noted concerning for atelectasis or pneumonia and associated volume loss. Some degree of pleural effusion may be present as well. Right lung is clear. Electronically Signed   By: James  Green Jr M.D.   On: 05/23/2020 20:04    Impression / Plan:   Impression: 1.  Coffee-ground emesis and melena: Nurse reports 1 episode of coffee-ground emesis yesterday morning and then 1 episode of melena today, hemoglobin has   fallen 10--> 8.8 yesterday evening--> 8.1 currently 2.  Acute anemia 3.  Status post AAA repair on 05/22/2020 4.  Acute respiratory failure: On 12 L nasal cannula, thought due to atelectasis versus mucous plug  Plan: 1.  Agree with PPI drip 2.  Patient is on heparin, this would need to be held prior to any procedures 3.  Dr. Myrtie Neither to weigh in on when this procedure would be appropriate, may need intervention for his respiratory status first.  Patient requests that we do not do any procedures today.  Did discuss with him that if he has further signs of acute GI bleed we may need to intervene sooner rather than later. 4.  Continue to monitor hemoglobin and transfusion as needed less than 7. 5.  Please await final recommendations from Dr. Myrtie Neither later today.  Thank you for your kind consultation, we will continue to follow.  Violet Baldy [facility]  05/25/2020, 9:52 AM  I have reviewed the entire case in detail with the above APP and discussed the plan in detail.  Therefore, I agree with the diagnoses recorded above. In addition,  I have personally interviewed and examined the patient and have personally reviewed any abdominal/pelvic CT scan  images.  My additional thoughts are as follows:  I saw this patient along with Dr. Myrla Halsted of pulmonary/critical care, and explained the situation in detail.  My concern is for upper GI bleeding in the postoperative state.  He has chronic heartburn, unknown if he might have esophageal or gastric ulcer disease.  He is a longtime smoker as well. I described the nature of an upper endoscopy along with risks and benefits and Justin Poole was agreeable after calling a friend or family member and discussing it with them.  The benefits and risks of the planned procedure were described in detail with the patient or (when appropriate) their health care proxy.  Risks were outlined as including, but not limited to, bleeding, infection, perforation, adverse medication reaction leading to cardiac or pulmonary decompensation, pancreatitis (if ERCP).  The limitation of incomplete mucosal visualization was also discussed.  No guarantees or warranties were given.  Patient at increased risk for cardiopulmonary complications of procedure due to medical comorbidities.  This will be coordinated with pulmonary later this afternoon.  The patient will be electively intubated in the ICU, then have his bedside bronchoscopy to relieve the upper airway obstruction (suspected mucous plug), immediately followed by upper endoscopy by me.  This patient is on a Protonix drip and currently receiving PRBCs.  He is still on ciprofloxacin and metronidazole from yesterday when the clinical concern was ischemic colitis.  This will most likely be discontinued after upper endoscopy if source of bleeding is found there.  Charlie Pitter III [phone]

## 2020-05-26 ENCOUNTER — Inpatient Hospital Stay (HOSPITAL_COMMUNITY): Payer: Medicaid Other

## 2020-05-26 DIAGNOSIS — D62 Acute posthemorrhagic anemia: Secondary | ICD-10-CM

## 2020-05-26 DIAGNOSIS — J9601 Acute respiratory failure with hypoxia: Secondary | ICD-10-CM

## 2020-05-26 LAB — CBC
HCT: 28.4 % — ABNORMAL LOW (ref 39.0–52.0)
Hemoglobin: 9.4 g/dL — ABNORMAL LOW (ref 13.0–17.0)
MCH: 31 pg (ref 26.0–34.0)
MCHC: 33.1 g/dL (ref 30.0–36.0)
MCV: 93.7 fL (ref 80.0–100.0)
Platelets: 175 10*3/uL (ref 150–400)
RBC: 3.03 MIL/uL — ABNORMAL LOW (ref 4.22–5.81)
RDW: 15.1 % (ref 11.5–15.5)
WBC: 13.1 10*3/uL — ABNORMAL HIGH (ref 4.0–10.5)
nRBC: 0 % (ref 0.0–0.2)

## 2020-05-26 LAB — URINALYSIS, ROUTINE W REFLEX MICROSCOPIC
Bilirubin Urine: NEGATIVE
Glucose, UA: NEGATIVE mg/dL
Ketones, ur: 20 mg/dL — AB
Leukocytes,Ua: NEGATIVE
Nitrite: NEGATIVE
Protein, ur: 30 mg/dL — AB
Specific Gravity, Urine: 1.016 (ref 1.005–1.030)
pH: 5 (ref 5.0–8.0)

## 2020-05-26 LAB — GLUCOSE, CAPILLARY: Glucose-Capillary: 77 mg/dL (ref 70–99)

## 2020-05-26 MED ORDER — PANTOPRAZOLE SODIUM 40 MG PO TBEC
40.0000 mg | DELAYED_RELEASE_TABLET | Freq: Two times a day (BID) | ORAL | Status: DC
  Administered 2020-05-26 – 2020-05-30 (×8): 40 mg via ORAL
  Filled 2020-05-26 (×8): qty 1

## 2020-05-26 MED ORDER — MONTELUKAST SODIUM 10 MG PO TABS
10.0000 mg | ORAL_TABLET | Freq: Every day | ORAL | Status: DC
Start: 2020-05-26 — End: 2020-05-30
  Administered 2020-05-26 – 2020-05-29 (×4): 10 mg via ORAL
  Filled 2020-05-26 (×4): qty 1

## 2020-05-26 MED ORDER — FLUTICASONE FUROATE-VILANTEROL 200-25 MCG/INH IN AEPB
1.0000 | INHALATION_SPRAY | Freq: Every day | RESPIRATORY_TRACT | Status: DC
  Administered 2020-05-26 – 2020-05-30 (×5): 1 via RESPIRATORY_TRACT
  Filled 2020-05-26: qty 28

## 2020-05-26 MED ORDER — PREDNISONE 20 MG PO TABS
20.0000 mg | ORAL_TABLET | Freq: Every day | ORAL | Status: DC
  Administered 2020-05-27 – 2020-05-30 (×4): 20 mg via ORAL
  Filled 2020-05-26 (×4): qty 1

## 2020-05-26 MED ORDER — SUCRALFATE 1 GM/10ML PO SUSP
1.0000 g | Freq: Three times a day (TID) | ORAL | Status: DC
  Administered 2020-05-26 – 2020-05-30 (×15): 1 g via ORAL
  Filled 2020-05-26 (×15): qty 10

## 2020-05-26 MED FILL — Sodium Chloride Irrigation Soln 0.9%: Qty: 3000 | Status: AC

## 2020-05-26 MED FILL — Sodium Chloride IV Soln 0.9%: INTRAVENOUS | Qty: 1000 | Status: AC

## 2020-05-26 MED FILL — Thrombin (Recombinant) For Soln 20000 Unit: CUTANEOUS | Qty: 1 | Status: AC

## 2020-05-26 MED FILL — Heparin Sodium (Porcine) Inj 1000 Unit/ML: INTRAMUSCULAR | Qty: 30 | Status: AC

## 2020-05-26 NOTE — Progress Notes (Signed)
NAME:  Justin Poole, MRN:  [id number], DOB:  Mar 26, 1966, LOS: 4 ADMISSION DATE:  05/22/2020, CONSULTATION DATE: 05/23/2020 REFERRING MD: Dr. Donzetta Matters, CHIEF COMPLAINT: Shortness of breath  Brief History:  55 year old male status post AAA repair now with hypoxia  History of Present Illness:  [name] who presented to the hospital for a scheduled AAA repair that was performed on 22 May 2020.  Patient had open abdominal aortic aneurysm repair with a 57m Dacron tube graft.  Estimated blood loss was 1250 mL.  Patient received 500 cc of Cell Saver.  Postop day 1 patient has been extubated and doing well on nasal cannula.  Central line and Foley catheter has been discontinued.  Through the evening of postop day 1 patient had increasing oxygen requirements in addition to increasing respiratory rate.  Chest x-ray was obtained and demonstrated left lung atelectasis.  Patient denies any chest pain/chest pressure/abdominal pain.  He is complaining of nausea.  Denies vomiting or diarrhea.  No radiculopathy of the left arm neck or jaw.  Patient states he is mildly short of breath.  No wheezing-subjective  Past Medical History:  5.7 cm AAA Asthma GERD Hypertension Left bundle branch block Alcohol related pancreatitis 06/2018  active smoker   Consults:  Critical care medicine  Procedures:  Open abdominal aortic aneurysm repair 57/05/2020  Significant Diagnostic Tests:    Micro Data:  COVID: Negative  Antimicrobials:  None  Subjective  Weaning well this AM. Wants tube out. Better aeration on CXR.  Objective   Blood pressure 119/66, pulse 92, temperature 99.8 F (37.7 C), temperature source Oral, resp. rate 20, height 5' 0.98" (1.549 m), weight 51.5 kg, SpO2 96 %.    Vent Mode: PSV;CPAP FiO2 (%):  [40 %-100 %] 40 % Set Rate:  [8 bmp-18 bmp] 18 bmp Vt Set:  [410 mL] 410 mL PEEP:  [5 cmH20] 5 cmH20 Pressure Support:  [5 cmH20] 5 cmH20 Plateau Pressure:  [20 cmH20-30  cmH20] 20 cmH20   Intake/Output Summary (Last 24 hours) at 05/26/2020 1115 Last data filed at 05/26/2020 1000 Gross per 24 hour  Intake 966.07 ml  Output 875 ml  Net 91.07 ml   Filed Weights   05/23/20 1106 05/26/20 0430  Weight: 55.1 kg 51.5 kg    Examination: Constitutional: no acute distress on vent  Eyes: EOMI, pupils equal Ears, nose, mouth, and throat: ETT in place, minimal thick secretions Cardiovascular: RRR, ext warm Respiratory: Scattered wheezing, better air movement on L Gastrointestinal: Soft, +BS Skin: No rashes, normal turgor Neurologic: Moves all 4 ext to command Psychiatric: in better spirits today  H/H good WBC improved Labs look good  Assessment & Plan:  Postoperative hypoxemic respiratory failure due to left lung mucus plugging s/p therapeutic bronchoscopy Status post AAA repair with 123mDacron graft Active smoker History of asthma- seems to be in flare UGIB- EGD with severe esophagitis, no ulcers Fever- off abx, pct neg, BAL pending  - Trend fever/WBC curve off abx, cultures sent by elink - SAT/SBT/extubation - Flutter, IS - Start breo, prednisone (short course), singulair - Progressive mobility - Hopefully has turned corner  BeCarMaxevaluated daily)  Diet advance after extubation Pain/Anxiety/Delirium protocol (if indicated): wean VAP protocol (if indicated): off DVT prophylaxis: heparin GI prophylaxis: Protonix Glucose control: Monitor blood sugars Mobility: BR Disposition: ICU pending vent liberation  Goals of Care:  Per primary Full code   Patient critically ill due to respiratory failure Interventions to address this today ventilator  weaning Risk of deterioration without these interventions is high  I personally spent 34 minutes providing critical care not including any separately billable procedures  Erskine Emery MD [facility] 05/26/2020 11:23 AM Personal pager: [phone] If unanswered, please  page CCM On-call: [phone]

## 2020-05-26 NOTE — Procedures (Signed)
Extubation Procedure Note  Patient Details:   Name: Justin Poole DOB: June 02, 1965 MRN: [id number]   Airway Documentation:    Vent end date: 05/26/20 Vent end time: 0821   Evaluation  O2 sats: stable throughout Complications: No apparent complications Patient did tolerate procedure well. Bilateral Breath Sounds: Clear,Diminished   Pt extubated to 4L [name] per MD order. Pt had positive cuff leak and no stridor noted post extubation. Pt able to voice his name.  [name] 05/26/2020, 8:22 AM

## 2020-05-26 NOTE — Progress Notes (Addendum)
Daily Rounding Note  05/26/2020, 8:31 AM  LOS: 4 days   SUBJECTIVE:   Chief complaint: Reflux esophagitis Still w sternal burning pain.  No nausea, no reflux.  Lips feels very dry and wants ice chips.  Black stool yesterday.   Off vent, extubated 0820 today.   Tachy to low 100s-126.  resp to 36.  sats 94% on 4 L [place] oxygen.  Temp 101.5 at 0430 this AM.  Afebrile since.     OBJECTIVE:         Vital signs in last 24 hours:    Temp:  [97.6 F (36.4 C)-101.5 F (38.6 C)] 99.7 F (37.6 C) (01/18 0625) Pulse Rate:  [81-126] 126 (01/18 0821) Resp:  [16-36] 36 (01/18 0821) BP: (92-146)/(48-86) 106/59 (01/18 0700) SpO2:  [93 %-100 %] 94 % (01/18 0821) FiO2 (%):  [40 %-100 %] 40 % (01/18 0733) Weight:  [51.5 kg] 51.5 kg (01/18 0430) Last BM Date: 05/24/20 Filed Weights   05/23/20 1106 05/26/20 0430  Weight: 55.1 kg 51.5 kg   General: thin, looks unwell.  Poor dentition   Heart: RRR Chest: clear bil in front.  No dyspnea at rest or w speach Abdomen: soft, long stapled intact surgical incision.  Active BS.  Tender.  Extremities: no CCE Neuro/Psych:  Oriented x 3.  Moves all 4s.  Alert.  No gross deficits, no tremors.    Intake/Output from previous day: 01/17 0701 - 01/18 0700 In: 1336 [I.V.:698.8; Blood:416.7; IV Piggyback:220.6] Out: 875 [Urine:875]  Intake/Output this shift: No intake/output data recorded.  Lab Results: Recent Labs    05/24/20 0602 05/24/20 1643 05/25/20 0140 05/25/20 1205 05/26/20 0217  WBC 23.5*  --  19.3*  --  13.1*  HGB 10.0*   < > 8.1* 9.8* 9.4*  HCT 29.9*   < > 24.5* 30.0* 28.4*  PLT 161  --  166  --  175   < > = values in this interval not displayed.   BMET Recent Labs    05/23/20 2005 05/24/20 0602 05/25/20 0140  NA 132* 133* 135  K 4.1 4.1 4.0  CL  --  95* 102  CO2  --  27 26  GLUCOSE  --  165* 104*  BUN  --  19 17  CREATININE  --  0.80 0.90  CALCIUM  --  8.6* 8.3*    LFT No results for input(s): PROT, ALBUMIN, AST, ALT, ALKPHOS, BILITOT, BILIDIR, IBILI in the last 72 hours. PT/INR No results for input(s): LABPROT, INR in the last 72 hours. Hepatitis Panel No results for input(s): HEPBSAG, HCVAB, HEPAIGM, HEPBIGM in the last 72 hours.  Studies/Results: DG Chest 1 View  Result Date: 05/25/2020 CLINICAL DATA:  Intubation EXAM: CHEST  1 VIEW COMPARISON:  Portable exam 1537 hours compared to 0752 hours FINDINGS: Tip of endotracheal tube projects 3.8 cm above carina. Complete opacification of the LEFT hemithorax with slight mediastinal shift to the LEFT unchanged from prior exam but new since 05/22/2020. Abrupt cut off of LEFT main bronchus, favor mucous plugging. Hyperinflated RIGHT lung which appears clear. No pneumothorax IMPRESSION: Complete opacification of the LEFT hemithorax with mediastinal shift to the LEFT, favor mucous plugging with atelectasis of LEFT lung. Tip of endotracheal tube projects 3.8 cm above carina. Electronically Signed   By: Ulyses Southward M.D.   On: 05/25/2020 15:52   DG Chest 1 View  Result Date: 05/25/2020 CLINICAL DATA:  Atelectasis EXAM: CHEST  1 VIEW  COMPARISON:  May 23, 2020 FINDINGS: There is complete collapse of the left lung with shift of heart and mediastinum toward the left. Right lung is clear. Heart size appears stable with heart upper normal in size. Pulmonary vascularity on the right appears unremarkable. Pulmonary vascular on the left is obscured. There is aortic atherosclerosis. No bone lesions. IMPRESSION: Apparent complete collapse of the left lung. Suspect mucous plug given abrupt termination of lucency in the proximal left main bronchus. There may be pleural effusions superimposed on the left. Right lung hyperexpanded clear.  Grossly normal cardiac silhouette. Aortic Atherosclerosis (ICD10-I70.0). These results will be called to the ordering clinician or representative by the Radiologist Assistant, and communication  documented in the PACS or [employer]. Electronically Signed   By: Bretta Bang III M.D.   On: 05/25/2020 08:04   DG Abd 1 View  Result Date: 05/26/2020 CLINICAL DATA:  Initial evaluation for OG tube placement. EXAM: ABDOMEN - 1 VIEW COMPARISON:  Prior chest radiograph from earlier the same day. FINDINGS: An enteric tube has been advanced. Tip now seen overlying the stomach at the left upper quadrant, side hole beyond the GE junction. Visualized bowel gas pattern is nonobstructive. Surgical clips overlie the mid abdomen. Persistent dense opacity at the left lung base, better evaluated on prior radiograph from earlier the same day. Degenerative spondylosis noted within the lower lumbar spine. IMPRESSION: Enteric tube in satisfactory position with tip overlying the stomach at the left upper quadrant, side hole beyond the GE junction. Electronically Signed   By: Rise Mu M.D.   On: 05/26/2020 03:57   DG Abd 1 View  Result Date: 05/24/2020 CLINICAL DATA:  Peripheral artery disease EXAM: ABDOMEN - 1 VIEW COMPARISON:  05/22/2020 abdominal radiograph FINDINGS: No dilated small bowel loops. Mild colonic gas and stool. No evidence of pneumatosis or pneumoperitoneum. Surgical clips overlie the left upper lumbar spine. No radiopaque nephrolithiasis. Moderate lumbar spondylosis. Healed deformity in the lateral right ninth rib. IMPRESSION: Nonobstructive bowel gas pattern. Electronically Signed   By: Delbert Phenix M.D.   On: 05/24/2020 11:21   DG CHEST PORT 1 VIEW  Result Date: 05/26/2020 CLINICAL DATA:  Endotracheal tube placement EXAM: PORTABLE CHEST 1 VIEW COMPARISON:  May 25, 2020 FINDINGS: The endotracheal tube terminates above the carina. The enteric tube terminates in the left upper quadrant. The side hole projects over the expected region of the GE junction. There is improved aeration throughout the left lung field with re-expansion of the left upper lobe. There is a persistent dense  opacity at the left lung base. No pneumothorax. IMPRESSION: 1. Improved aeration throughout the left lung field with re-expansion of the left upper lobe. 2. Persistent dense opacity at the left lung base. 3. Endotracheal tube terminates above the carina. 4. Enteric tube should be further advanced into the stomach by approximately 5-6 cm. Electronically Signed   By: Katherine Mantle M.D.   On: 05/26/2020 02:40   Scheduled Meds: . amLODipine  5 mg Oral Daily  . aspirin EC  81 mg Oral Q0600  . Chlorhexidine Gluconate Cloth  6 each Topical Daily  . docusate sodium  100 mg Oral Daily  . heparin  5,000 Units Subcutaneous Q8H  . levalbuterol  0.63 mg Nebulization TID  . mouth rinse  15 mL Mouth Rinse BID  . metoprolol tartrate  25 mg Oral BID  . nicotine  21 mg Transdermal Daily  . rocuronium  100 mg Intravenous Once  . rosuvastatin  40 mg Oral  Daily  . sodium chloride HYPERTONIC  4 mL Nebulization BID   Continuous Infusions: . sodium chloride Stopped (05/22/20 1800)  . sodium chloride 20 mL/hr at 05/26/20 0700  . fentaNYL infusion INTRAVENOUS 50 mcg/hr (05/26/20 0700)  . magnesium sulfate bolus IVPB    . pantoprozole (PROTONIX) infusion 8 mg/hr (05/26/20 0700)  . propofol (DIPRIVAN) infusion 30 mcg/kg/min (05/26/20 0700)   PRN Meds:.sodium chloride, acetaminophen **OR** acetaminophen, alum & mag hydroxide-simeth, bisacodyl, fentaNYL (SUBLIMAZE) injection, guaiFENesin-dextromethorphan, hydrALAZINE, labetalol, levalbuterol, magnesium sulfate bolus IVPB, metoprolol tartrate, ondansetron, oxyCODONE-acetaminophen, phenol, potassium chloride, senna-docusate, sodium phosphate   ASSESMENT:   *   CGE, melena, pyrosis. 05/25/20 EGD: severe, grade D reflux, ulcerative esophagitis   *   05/22/20 AAA repair w aorta-bifem mesh graft repair  *   Hypoxia. Currently sats 94% on 4 L [place] oxygen.     *   ETOH pancreatitis 06/2018.    *   Normocytic anemia.  Hgb 9.4, range of 16 to 17 before surgery. S/p 1  PRBC on 1/17.    *   Fever, declining WBCs.   WBCs but no organisms on 1/17 bronchial lavage. No current abx.      PLAN   *   Smoking cessation.  Elevate HOB.  Change Protonix IV to Protonix 40 po bid tonig.    *   RN will assess pt swallowing in a few hours post extubation and advance diet as tolerated.      Jennye Moccasin  05/26/2020, 8:31 AM Phone [phone]  I have discussed the case with the PA, and that is the plan I formulated. I personally interviewed and examined the patient. Additional diagnosis: Melena, acute blood loss anemia, reflux esophagitis  He has chronic reflux related epigastric pain and pyrosis.  Has odynophagia without dysphagia.  Endoscopy findings conveyed to him since he is now alert and conversational after extubation. No ongoing overt GI bleeding.  He will be converted from IV Protonix to 40 mg by mouth twice daily for 8 weeks. We have added Carafate 3 times daily with meals to relieve odynophagia. When he passes postextubation swallow test, okay for soft diet, which she should continue for a week and then resume regular diet. We will sign off for now and plan for post discharge office follow-up.  Total time 25 minutes.  Charlie Pitter III Office: [phone]

## 2020-05-26 NOTE — Plan of Care (Signed)
  Problem: Education: Goal: Knowledge of the prescribed therapeutic regimen will improve Outcome: Progressing   Problem: Bowel/Gastric: Goal: Gastrointestinal status for postoperative course will improve Outcome: Progressing   Problem: Cardiac: Goal: Ability to maintain an adequate cardiac output will improve Outcome: Progressing   Problem: Clinical Measurements: Goal: Postoperative complications will be avoided or minimized Outcome: Progressing   Problem: Respiratory: Goal: Respiratory status will improve Outcome: Progressing   Problem: Skin Integrity: Goal: Demonstration of wound healing without infection will improve Outcome: Progressing   Problem: Urinary Elimination: Goal: Ability to achieve and maintain adequate renal perfusion and functioning will improve Outcome: Progressing   Problem: Education: Goal: Knowledge of General Education information will improve Description: Including pain rating scale, medication(s)/side effects and non-pharmacologic comfort measures Outcome: Progressing   Problem: Health Behavior/Discharge Planning: Goal: Ability to manage health-related needs will improve Outcome: Progressing   Problem: Clinical Measurements: Goal: Ability to maintain clinical measurements within normal limits will improve Outcome: Progressing Goal: Will remain free from infection Outcome: Progressing Goal: Diagnostic test results will improve Outcome: Progressing Goal: Respiratory complications will improve Outcome: Progressing Goal: Cardiovascular complication will be avoided Outcome: Progressing   Problem: Activity: Goal: Risk for activity intolerance will decrease Outcome: Progressing   Problem: Nutrition: Goal: Adequate nutrition will be maintained Outcome: Progressing   Problem: Coping: Goal: Level of anxiety will decrease Outcome: Progressing   Problem: Elimination: Goal: Will not experience complications related to bowel motility Outcome:  Progressing Goal: Will not experience complications related to urinary retention Outcome: Progressing   Problem: Pain Managment: Goal: General experience of comfort will improve Outcome: Progressing   Problem: Safety: Goal: Ability to remain free from injury will improve Outcome: Progressing   Problem: Skin Integrity: Goal: Risk for impaired skin integrity will decrease Outcome: Progressing   Problem: Safety: Goal: Non-violent Restraint(s) Outcome: Progressing   

## 2020-05-26 NOTE — Progress Notes (Deleted)
HPI: FU CM. Abd ultrasound November 2020 showed infrarenal abdominal aortic aneurysm measuring 59 mm.    Nuclear study December 2021 showed ejection fraction 49% with no ischemia or infarction.  Echocardiogram December 2021 showed ejection fraction 40 to 45%, grade 1 diastolic dysfunction, hypokinesis of the inferior/inferolateral wall.  Trace aortic insufficiency.  Patient had repair of abdominal aortic aneurysm January 2022.  Procedure complicated by GI bleed.  Also with respiratory failure.  Since last seen  No current facility-administered medications for this visit.   No current outpatient medications on file.   Facility-Administered Medications Ordered in Other Visits  Medication Dose Route Frequency Provider Last Rate Last Admin  . 0.9 %  sodium chloride infusion  500 mL Intravenous Once PRN Lars Mage, PA-C   Stopped at 05/22/20 1800  . 0.9 %  sodium chloride infusion   Intravenous Continuous Unk Lightning, [place] 20 mL/hr at 05/26/20 0700 Infusion Verify at 05/26/20 0700  . acetaminophen (TYLENOL) tablet 325-650 mg  325-650 mg Oral Q4H PRN Lars Mage, PA-C   650 mg at 05/26/20 0277   Or  . acetaminophen (TYLENOL) suppository 325-650 mg  325-650 mg Rectal Q4H PRN Lars Mage, PA-C      . alum & mag hydroxide-simeth (MAALOX/MYLANTA) 200-200-20 MG/5ML suspension 15-30 mL  15-30 mL Oral Q2H PRN Clinton Gallant M, PA-C      . amLODipine (NORVASC) tablet 5 mg  5 mg Oral Daily Clinton Gallant M, PA-C   5 mg at 05/25/20 1047  . aspirin EC tablet 81 mg  81 mg Oral Q0600 Lars Mage, PA-C   81 mg at 05/26/20 0515  . bisacodyl (DULCOLAX) EC tablet 5 mg  5 mg Oral Daily PRN Lars Mage, PA-C      . Chlorhexidine Gluconate Cloth 2 % PADS 6 each  6 each Topical Daily Leonie Douglas, MD   6 each at 05/25/20 1715  . docusate sodium (COLACE) capsule 100 mg  100 mg Oral Daily Clinton Gallant M, PA-C   100 mg at 05/25/20 1046  . fentaNYL (SUBLIMAZE) injection 25 mcg  25 mcg  Intravenous Q2H PRN Kirtland Bouchard, MD   25 mcg at 05/24/20 1942  . fentaNYL in NS (68mcg/ml) infusion-PREMIX  0-400 mcg/hr Intravenous Continuous Lorin Glass, MD 5 mL/hr at 05/26/20 0700 50 mcg/hr at 05/26/20 0700  . guaiFENesin-dextromethorphan (ROBITUSSIN DM) 100-10 MG/5ML syrup 15 mL  15 mL Oral Q4H PRN Clinton Gallant M, PA-C      . heparin injection 5,000 Units  5,000 Units Subcutaneous Q8H Leonie Douglas, MD   5,000 Units at 05/26/20 0516  . hydrALAZINE (APRESOLINE) injection 5 mg  5 mg Intravenous Q20 Min PRN Clinton Gallant M, PA-C   5 mg at 05/22/20 1432  . labetalol (NORMODYNE) injection 10 mg  10 mg Intravenous Q10 min PRN Lars Mage, PA-C   10 mg at 05/23/20 1056  . levalbuterol (XOPENEX) nebulizer solution 0.63 mg  0.63 mg Nebulization TID Leonie Douglas, MD   0.63 mg at 05/26/20 0727  . levalbuterol (XOPENEX) nebulizer solution 0.63 mg  0.63 mg Nebulization Q6H PRN Leonie Douglas, MD   0.63 mg at 05/24/20 0049  . magnesium sulfate IVPB 2 g 50 mL  2 g Intravenous Daily PRN Clinton Gallant M, PA-C      . MEDLINE mouth rinse  15 mL Mouth Rinse BID Leonie Douglas, MD   15 mL at  05/25/20 2105  . metoprolol tartrate (LOPRESSOR) injection 2-5 mg  2-5 mg Intravenous Q2H PRN Clinton Gallant M, PA-C   3 mg at 05/24/20 0815  . metoprolol tartrate (LOPRESSOR) tablet 25 mg  25 mg Oral BID Lorin Glass, MD   25 mg at 05/25/20 1046  . nicotine (NICODERM CQ - dosed in mg/24 hours) patch 21 mg  21 mg Transdermal Daily Clinton Gallant M, PA-C   21 mg at 05/25/20 1046  . ondansetron (ZOFRAN) injection 4 mg  4 mg Intravenous Q6H PRN Clinton Gallant M, PA-C   4 mg at 05/24/20 1223  . oxyCODONE-acetaminophen (PERCOCET/ROXICET) 5-325 MG per tablet 1-2 tablet  1-2 tablet Oral Q4H PRN Lars Mage, PA-C   2 tablet at 05/25/20 0003  . pantoprazole (PROTONIX) 80 mg in sodium chloride 0.9 % 100 mL (0.8 mg/mL) infusion  8 mg/hr Intravenous Continuous Josiah Lobo T, MD 10 mL/hr at  05/26/20 0700 8 mg/hr at 05/26/20 0700  . phenol (CHLORASEPTIC) mouth spray 1 spray  1 spray Mouth/Throat PRN Clinton Gallant M, PA-C      . potassium chloride SA (KLOR-CON) CR tablet 20-40 mEq  20-40 mEq Oral Daily PRN Clinton Gallant M, PA-C      . propofol (DIPRIVAN) 1000 MG/100ML infusion  5-80 mcg/kg/min Intravenous Titrated Lorin Glass, MD 9.92 mL/hr at 05/26/20 0700 30 mcg/kg/min at 05/26/20 0700  . rocuronium (ZEMURON) injection 100 mg  100 mg Intravenous Once Lorin Glass, MD      . rosuvastatin (CRESTOR) tablet 40 mg  40 mg Oral Daily Clinton Gallant M, PA-C   40 mg at 05/25/20 1046  . senna-docusate (Senokot-S) tablet 1 tablet  1 tablet Oral QHS PRN Clinton Gallant M, PA-C      . sodium chloride HYPERTONIC 3 % nebulizer solution 4 mL  4 mL Nebulization BID Lorin Glass, MD   4 mL at 05/26/20 0727  . sodium phosphate (FLEET) 7-19 GM/118ML enema 1 enema  1 enema Rectal Once PRN Lars Mage, PA-C         Past Medical History:  Diagnosis Date  . AAA (abdominal aortic aneurysm) (HCC)   . Asthma    as a child  . Dysrhythmia    pt states they have told him he "has an irregular heart beat"  . GERD (gastroesophageal reflux disease)   . Hypertension   . Left bundle branch block   . Pancreatitis    hospitalization 06/2018 for acute pancreatitis, likely relatead to alcohol use    Past Surgical History:  Procedure Laterality Date  . AORTA - BILATERAL FEMORAL ARTERY BYPASS GRAFT N/A 05/22/2020   Procedure: OPEN ABDOMINAL AORTIC ANEURYSM REPAIR WITH HEMASHIELD GRAFT;  Surgeon: Leonie Douglas, MD;  Location: [facility] OR;  Service: Vascular;  Laterality: N/A;  . HAND SURGERY Left     Social History   Socioeconomic History  . Marital status: Single    Spouse name: Not on file  . Number of children: Not on file  . Years of education: Not on file  . Highest education level: Not on file  Occupational History  . Not on file  Tobacco Use  . Smoking status: Current Every Day Smoker     Packs/day: 0.50    Years: 45.00    Pack years: 22.50    Types: Cigarettes  . Smokeless tobacco: Never Used  Vaping Use  . Vaping Use: Never used  Substance and Sexual Activity  . Alcohol use: Yes  Comment: 1/2 pint a day   . Drug use: Not Currently  . Sexual activity: Not Currently  Other Topics Concern  . Not on file  Social History Narrative  . Not on file   Social Determinants of Health   Financial Resource Strain: Not on file  Food Insecurity: Not on file  Transportation Needs: Not on file  Physical Activity: Not on file  Stress: Not on file  Social Connections: Not on file  Intimate Partner Violence: Not on file    Family History  Problem Relation Age of Onset  . CAD Mother        Died of MI 35s  . CAD Father        Died of MI 52s  . Diabetes Mellitus II Neg Hx     ROS: no fevers or chills, productive cough, hemoptysis, dysphasia, odynophagia, melena, hematochezia, dysuria, hematuria, rash, seizure activity, orthopnea, PND, pedal edema, claudication. Remaining systems are negative.  Physical Exam: Well-developed well-nourished in no acute distress.  Skin is warm and dry.  HEENT is normal.  Neck is supple.  Chest is clear to auscultation with normal expansion.  Cardiovascular exam is regular rate and rhythm.  Abdominal exam nontender or distended. No masses palpated. Extremities show no edema. neuro grossly intact  ECG- personally reviewed  A/P  1  Justin Millers, MD

## 2020-05-26 NOTE — Progress Notes (Addendum)
Progress Note    05/26/2020 8:01 AM 1 Day Post-Op  Subjective:  On vent. Nods head when asked if he is having pain in abdomen. Otherwise denies pain. Wants tube out   Vitals:   05/26/20 0728 05/26/20 0733  BP:    Pulse:  95  Resp:  18  Temp:    SpO2: 97% 96%   Physical Exam: Cardiac: tachycardic Lungs: on vent Incisions:  Laparotomy incision clean,dry and intact Extremities: 2+ femoral pusles bilaterally, 2+ DP pulses. Feet warm and well perfused. 2+ radial pulses bilaterally. Motor and sensation intact Abdomen: soft, non distended. Expected tenderness along incision Neurologic: alert and oriented  CBC    Component Value Date/Time   WBC 13.1 (H) 05/26/2020 0217   RBC 3.03 (L) 05/26/2020 0217   HGB 9.4 (L) 05/26/2020 0217   HCT 28.4 (L) 05/26/2020 0217   PLT 175 05/26/2020 0217   MCV 93.7 05/26/2020 0217   MCH 31.0 05/26/2020 0217   MCHC 33.1 05/26/2020 0217   RDW 15.1 05/26/2020 0217   LYMPHSABS 0.8 05/24/2020 0602   MONOABS 2.7 (H) 05/24/2020 0602   EOSABS 0.0 05/24/2020 0602   BASOSABS 0.0 05/24/2020 0602    BMET    Component Value Date/Time   NA 135 05/25/2020 0140   K 4.0 05/25/2020 0140   CL 102 05/25/2020 0140   CO2 26 05/25/2020 0140   GLUCOSE 104 (H) 05/25/2020 0140   BUN 17 05/25/2020 0140   CREATININE 0.90 05/25/2020 0140   CALCIUM 8.3 (L) 05/25/2020 0140   GFRNONAA >60 05/25/2020 0140   GFRAA >60 11/24/2019 1220    INR    Component Value Date/Time   INR 1.1 05/22/2020 1424     Intake/Output Summary (Last 24 hours) at 05/26/2020 0801 Last data filed at 05/26/2020 0700 Gross per 24 hour  Intake 1215.43 ml  Output 875 ml  Net 340.43 ml     Assessment/Plan:  55 y.o. male is s/p open AAA repair 4 Days Post-Op  CNS: PRN pain control. PT/OT/OOB. CV: Keep SBP 100-160.  Vasc: 2+ palpable femoral pulses, 2+ DP bilaterally. Feet warm and well perfused. Motor and sensation intact. Laparotomy incision well approximated Resp: On vent.  Bronchoscopy - mucus plug. Resp cultures pending. Continue pulmonary toilet. extubation today per CCM GI: Likely ischemic colitis. Upper GI showed esophagitis. IV Protonix. Appreciate GI assistance. Continue supportive care with IVF, ABx. FEN/GU: Continue IVF. Electrolytes OK - replete PRN. Start TPN if NPO x 8 days. Heme:  Transfused 1u PRBC 1/17. Hgb 9.4 this morning  ID: Likely ischemic colitis. Continue cipro / flagyl. WBC trending down 13. Fever overnight 101. May need more Abx coverage. Cxr stable. UA and Blood cultures pending Endo: Keep BG 120-180.  DVT prophylaxis: sq heparin/ SCDs   Karoline Caldwell, [place] [facility] [phone] 05/26/2020 8:01 AM   I have seen and evaluated the patient. I agree with the PA note as documented above.  Postop day 4 status post open AAA repair with tube graft.  He was reintubated yesterday for EGD by GI and bronch.  He is now extubated this morning.  I reviewed his chest x-ray and the left upper lobe has improved aeration.  He is on 4 L nasal cannula.  States his abdominal pain is improved and is hungry.  No significant abdominal pain on exam.  EGD found severe reflux esophagitis.  40 mg protonix BID.  I will advance him to clear liquids.  Midline incision looks good.  Has easily  palpable dorsalis pedis pulses.  White blood cells improved from 19 to 13 although was febrile overnight to 101.5.  Will monitor cultures from BAL and no growth this morning. Blood cultures were also ordered by critical care overnight.  Hemoglobin 9.4.  Marty Heck, MD [facility] [place] Office: [phone]

## 2020-05-26 NOTE — Progress Notes (Signed)
eLink Physician-Brief Progress Note Patient Name: Justin Poole DOB: April 28, 1966 MRN: [id number]   Date of Service  05/26/2020  HPI/Events of Note  Notified of temp 101.5 Not on antibiotics CXR unchanged  eICU Interventions  Blood culture and UA ordered Low threshold to start antibiotics if fever persistent     Intervention Category Intermediate Interventions: Other:  Darl Pikes 05/26/2020, 6:15 AM

## 2020-05-27 ENCOUNTER — Encounter (HOSPITAL_COMMUNITY): Payer: Self-pay | Admitting: Gastroenterology

## 2020-05-27 DIAGNOSIS — K2101 Gastro-esophageal reflux disease with esophagitis, with bleeding: Secondary | ICD-10-CM

## 2020-05-27 DIAGNOSIS — R12 Heartburn: Secondary | ICD-10-CM

## 2020-05-27 DIAGNOSIS — K921 Melena: Secondary | ICD-10-CM

## 2020-05-27 DIAGNOSIS — K920 Hematemesis: Secondary | ICD-10-CM

## 2020-05-27 LAB — CULTURE, RESPIRATORY W GRAM STAIN: Culture: NORMAL

## 2020-05-27 LAB — BASIC METABOLIC PANEL
Anion gap: 8 (ref 5–15)
BUN: 6 mg/dL (ref 6–20)
CO2: 28 mmol/L (ref 22–32)
Calcium: 8.1 mg/dL — ABNORMAL LOW (ref 8.9–10.3)
Chloride: 97 mmol/L — ABNORMAL LOW (ref 98–111)
Creatinine, Ser: 1 mg/dL (ref 0.61–1.24)
GFR, Estimated: >60 mL/min (ref >60)
Glucose, Bld: 123 mg/dL — ABNORMAL HIGH (ref 70–99)
Potassium: 2.7 mmol/L — CL (ref 3.5–5.1)
Sodium: 133 mmol/L — ABNORMAL LOW (ref 135–145)

## 2020-05-27 LAB — CBC
HCT: 26.3 % — ABNORMAL LOW (ref 39.0–52.0)
Hemoglobin: 8.9 g/dL — ABNORMAL LOW (ref 13.0–17.0)
MCH: 32 pg (ref 26.0–34.0)
MCHC: 33.8 g/dL (ref 30.0–36.0)
MCV: 94.6 fL (ref 80.0–100.0)
Platelets: 218 10*3/uL (ref 150–400)
RBC: 2.78 MIL/uL — ABNORMAL LOW (ref 4.22–5.81)
RDW: 14.7 % (ref 11.5–15.5)
WBC: 12.2 10*3/uL — ABNORMAL HIGH (ref 4.0–10.5)
nRBC: 0 % (ref 0.0–0.2)

## 2020-05-27 MED ORDER — POTASSIUM CHLORIDE 10 MEQ/100ML IV SOLN
10.0000 meq | INTRAVENOUS | Status: DC
  Administered 2020-05-27: 10 meq via INTRAVENOUS
  Filled 2020-05-27 (×2): qty 100

## 2020-05-27 MED ORDER — POTASSIUM CHLORIDE CRYS ER 20 MEQ PO TBCR
60.0000 meq | EXTENDED_RELEASE_TABLET | Freq: Once | ORAL | Status: AC
  Administered 2020-05-27: 60 meq via ORAL
  Filled 2020-05-27: qty 3

## 2020-05-27 MED ORDER — POTASSIUM CHLORIDE CRYS ER 20 MEQ PO TBCR
40.0000 meq | EXTENDED_RELEASE_TABLET | Freq: Once | ORAL | Status: AC
  Administered 2020-05-27: 40 meq via ORAL
  Filled 2020-05-27: qty 2

## 2020-05-27 NOTE — Progress Notes (Signed)
   NAME:  Justin Poole, MRN:  [id number], DOB:  06/18/1965, LOS: 5 ADMISSION DATE:  05/22/2020, CONSULTATION DATE: 05/23/2020 REFERRING MD: Dr. Donzetta Matters, CHIEF COMPLAINT: Shortness of breath  Brief History:  55 year old male status post AAA repair now with hypoxia  History of Present Illness:  [name] who presented to the hospital for a scheduled AAA repair that was performed on 22 May 2020.  Patient had open abdominal aortic aneurysm repair with a 26m Dacron tube graft.  Estimated blood loss was 1250 mL.  Patient received 500 cc of Cell Saver.  Postop day 1 patient has been extubated and doing well on nasal cannula.  Central line and Foley catheter has been discontinued.  Through the evening of postop day 1 patient had increasing oxygen requirements in addition to increasing respiratory rate.  Chest x-ray was obtained and demonstrated left lung atelectasis.  Patient denies any chest pain/chest pressure/abdominal pain.  He is complaining of nausea.  Denies vomiting or diarrhea.  No radiculopathy of the left arm neck or jaw.  Patient states he is mildly short of breath.  No wheezing-subjective  Past Medical History:  5.7 cm AAA Asthma GERD Hypertension Left bundle branch block Alcohol related pancreatitis 06/2018  active smoker   Consults:  Critical care medicine  Procedures:  Open abdominal aortic aneurysm repair 05/09/2020  Significant Diagnostic Tests:    Micro Data:  COVID: Negative  Antimicrobials:  None  Subjective  No events. Down to 4L. Pain under okay control. Did not sleep well. No BM.  Objective   Blood pressure (!) 105/58, pulse 95, temperature 98.3 F (36.8 C), temperature source Axillary, resp. rate 18, height 5' 0.98" (1.549 m), weight 50.4 kg, SpO2 95 %.    FiO2 (%):  [40 %] 40 %   Intake/Output Summary (Last 24 hours) at 05/27/2020 0739 Last data filed at 05/27/2020 0700 Gross per 24 hour  Intake 656.17 ml  Output 1550 ml  Net  -893.83 ml   Filed Weights   05/23/20 1106 05/26/20 0430 05/27/20 0606  Weight: 55.1 kg 51.5 kg 50.4 kg    Examination: Constitutional: thin man in no acute distress  Eyes: EOMI, pupils equal Ears, nose, mouth, and throat: MM dry, trachea midline Cardiovascular: RRR, ext warm Respiratory: Wheezing improved, no accessory muscle use, diminsihed breath sounds lower left chest Gastrointestinal: Soft, +BS Skin: No rashes, normal turgor Neurologic: Moves all 4 ext to command Psychiatric: RASS 0  H/H good WBC improved Sodium slightly low  Assessment & Plan:  Postoperative hypoxemic respiratory failure due to left lung mucus plugging s/p therapeutic bronchoscopy, improved but still weak Status post AAA repair with 14mDacron graft Active smoker History of asthma- seems to be in flare, improved UGIB- EGD with severe esophagitis, no ulcers Fever- off abx, pct neg, BAL pending  - Flutter, IS, mobility - Breo, prednisone (short course), singulair - Progressive mobility - Should be okay for progressive, PCCM available PRN  DaErskine EmeryD [facility] 05/27/2020 7:39 AM Personal pager: #[phone] unanswered, please page CCM On-call: #[phone]

## 2020-05-27 NOTE — Progress Notes (Signed)
Pt arrived from unit from 2heart. VSS.Will continue to monitor. Pt. Oriented to unit . Chg given and incision clean dry intact with skin glue  Everlean Cherry, RN

## 2020-05-27 NOTE — Progress Notes (Signed)
Physical Therapy Treatment Patient Details Name: Justin Poole MRN: [id number] DOB: [birth year] Today's Date: 05/27/2020    History of Present Illness 55 y.o. male with a infrarenal abdominal aortic aneurysm measuring 69mm in greatest orthogonal dimension. Other PMH includes GERD, HTN, LBBB, pancreatitis. Pt underwent Open abdominal aortic aneurysm repair on 05/22/2020. Respiratory issues 1/16 and 1/17 and pt placed on Heated HFNC.    PT Comments    Pt admitted with above diagnosis. Pt was able to ambulate with min guard assist and cues with RW with incr distance today and good safety with RW. Continues to progress well.   Pt currently with functional limitations due to balance and endurance deficits. Pt will benefit from skilled PT to increase their independence and safety with mobility to allow discharge to the venue listed below.     Follow Up Recommendations  Home health PT;Supervision - Intermittent (may progress to no needs)     Equipment Recommendations  Rolling walker with 5" wheels (may not require DME at the time of discharge)    Recommendations for Other Services       Precautions / Restrictions Precautions Precautions: Fall Restrictions Weight Bearing Restrictions: No    Mobility  Bed Mobility               General bed mobility comments: Pt in chair on arrival.  Transfers Overall transfer level: Needs assistance Equipment used: Rolling walker (2 wheeled) Transfers: Sit to/from [employer] Sit to Stand: Min assist         General transfer comment: minA to steady initially  Ambulation/Gait Ambulation/Gait assistance: Min guard Gait Distance (Feet): 370 Feet Assistive device: Rolling walker (2 wheeled) Gait Pattern/deviations: Decreased stride length;Step-through pattern;Trunk flexed;Drifts right/left Gait velocity: reduced Gait velocity interpretation: <1.31 ft/sec, indicative of household ambulator General Gait Details: Pt was able to  incr distance with ambulation today.  Pt with overall good safety with RW with cues to stay inside RW at times as well as cues to stand tall as he flexes as he fatigues.   Stairs             Wheelchair Mobility    Modified Rankin (Stroke Patients Only)       Balance Overall balance assessment: Needs assistance Sitting-balance support: No upper extremity supported;Feet supported Sitting balance-Leahy Scale: Fair     Standing balance support: Bilateral upper extremity supported;During functional activity Standing balance-Leahy Scale: Poor Standing balance comment: reliant on UE support to steady                            Cognition Arousal/Alertness: Awake/alert Behavior During Therapy: WFL for tasks assessed/performed Overall Cognitive Status: Impaired/Different from baseline Area of Impairment: Memory                     Memory: Decreased short-term memory                Exercises General Exercises - Lower Extremity Ankle Circles/Pumps: AROM;Both;10 reps;Supine Long Arc Quad: AROM;Both;10 reps;Supine    General Comments General comments (skin integrity, edema, etc.): Pt needed 4LO2 with activity to maintain sats >90%.  Pt on 4LO2 at rest as well.      Pertinent Vitals/Pain Pain Assessment: Faces Faces Pain Scale: Hurts even more Pain Location: abdomen Pain Descriptors / Indicators: Guarding;Grimacing Pain Intervention(s): Limited activity within patient's tolerance;Monitored during session;Premedicated before session;Repositioned    Home Living  Prior Function            PT Goals (current goals can now be found in the care plan section) Progress towards PT goals: Progressing toward goals    Frequency    Min 3X/week      PT Plan Current plan remains appropriate    Co-evaluation              AM-PAC PT "6 Clicks" Mobility   Outcome Measure  Help needed turning from your back to your  side while in a flat bed without using bedrails?: A Little Help needed moving from lying on your back to sitting on the side of a flat bed without using bedrails?: A Little Help needed moving to and from a bed to a chair (including a wheelchair)?: A Little Help needed standing up from a chair using your arms (e.g., wheelchair or bedside chair)?: A Little Help needed to walk in hospital room?: A Little Help needed climbing 3-5 steps with a railing? : A Lot 6 Click Score: 17    End of Session Equipment Utilized During Treatment: Oxygen;Gait belt Activity Tolerance: Patient tolerated treatment well Patient left: in chair;with call bell/phone within reach Nurse Communication: Mobility status PT Visit Diagnosis: Unsteadiness on feet (R26.81);Other abnormalities of gait and mobility (R26.89);Pain Pain - part of body:  (abdomen)     Time: 6213-0865 PT Time Calculation (min) (ACUTE ONLY): 15 min  Charges:  $Gait Training: 8-22 mins                     Torria Fromer W,PT Acute Rehabilitation Services Pager:  [phone]  Office:  [phone]     Berline Lopes 05/27/2020, 10:52 AM

## 2020-05-27 NOTE — Progress Notes (Signed)
eLink Physician-Brief Progress Note Patient Name: Justin Poole DOB: Apr 22, 1966 MRN: [id number]   Date of Service  05/27/2020  HPI/Events of Note  K+ 2.7, creatinine 1.0  eICU Interventions  Adult hyperkalemia replacement protocol orders entered.        Thomasene Lot Ogan 05/27/2020, 5:28 AM

## 2020-05-27 NOTE — Progress Notes (Signed)
Occupational Therapy Treatment Patient Details Name: Justin Poole MRN: [id number] DOB: Jan 28, 1966 Today's Date: 05/27/2020    History of present illness 55 y.o. male with a infrarenal abdominal aortic aneurysm measuring 65mm in greatest orthogonal dimension. Other PMH includes GERD, HTN, LBBB, pancreatitis. Pt underwent Open abdominal aortic aneurysm repair on 05/22/2020. Transferred to 4E on 1/19.   OT comments  Patient continues to make nice progress to ward all goals.  ADL goals advanced to independent level today.  He is largely setup and supervision for all ADL.  He is using a RW for in room mobility, but has less reliance on it.  OT will look to progress him to in room mobility without RW, and wean O2 as appropriate after discussed with nursing.  OT will continue to follow in the acute setting, but no OT follow up post acute.    Follow Up Recommendations  No OT follow up    Equipment Recommendations  Tub/shower seat    Recommendations for Other Services      Precautions / Restrictions Precautions Precautions: Fall Precaution Comments: O2 Restrictions Weight Bearing Restrictions: No       Mobility Bed Mobility Overal bed mobility: Modified Independent                Transfers Overall transfer level: Needs assistance Equipment used: Rolling walker (2 wheeled) Transfers: Sit to/from [employer] Transfers Sit to Stand: Supervision Stand pivot transfers: Supervision            Balance   Sitting-balance support: No upper extremity supported;Feet supported Sitting balance-Leahy Scale: Good     Standing balance support: Bilateral upper extremity supported;During functional activity Standing balance-Leahy Scale: Fair Standing balance comment: reliant on UE support to steady, but less need noted this date.                           ADL either performed or assessed with clinical judgement   ADL Overall ADL's : Needs  assistance/impaired Eating/Feeding: Independent;Sitting   Grooming: Wash/dry hands;Wash/dry face;Supervision/safety;Standing   Upper Body Bathing: Set up;Sitting   Lower Body Bathing: Supervison/ safety;Sit to/from stand   Upper Body Dressing : Set up;Sitting   Lower Body Dressing: Supervision/safety;Sit to/from stand   Toilet Transfer: Supervision/safety;RW;Ambulation Statistician Details (indicate cue type and reason): 2 L O2 Toileting- Clothing Manipulation and Hygiene: Independent;Sitting/lateral lean       Functional mobility during ADLs: Supervision/safety;Rolling walker       Vision       Perception     Praxis      Cognition Arousal/Alertness: Awake/alert Behavior During Therapy: WFL for tasks assessed/performed Overall Cognitive Status: Within Functional Limits for tasks assessed                                          Exercises     Shoulder Instructions       General Comments  VSS on 2 L O2.  BP noted to 105 with toileting.      Pertinent Vitals/ Pain       Faces Pain Scale: Hurts a little bit Pain Location: abdomen Pain Descriptors / Indicators: Tender;Tightness Pain Intervention(s): Monitored during session  Frequency  Min 2X/week        Progress Toward Goals  OT Goals(current goals can now be found in the care plan section)  Progress towards OT goals: Progressing toward goals  Acute Rehab OT Goals Patient Stated Goal: I'm ready to not be strapped to the bed OT Goal Formulation: With patient Time For Goal Achievement: 06/06/20 Potential to Achieve Goals: Good ADL Goals Pt Will Perform Lower Body Bathing: Independently;sit to/from stand Pt Will Perform Lower Body Dressing: Independently;sit to/from stand Pt Will Transfer to Toilet: Independently;ambulating  Plan Discharge plan remains appropriate    Co-evaluation                  AM-PAC OT "6 Clicks" Daily Activity     Outcome Measure   Help from another person eating meals?: None Help from another person taking care of personal grooming?: A Little Help from another person toileting, which includes using toliet, bedpan, or urinal?: A Little Help from another person bathing (including washing, rinsing, drying)?: A Little Help from another person to put on and taking off regular upper body clothing?: A Little Help from another person to put on and taking off regular lower body clothing?: A Little 6 Click Score: 19    End of Session Equipment Utilized During Treatment: Rolling walker;Oxygen  OT Visit Diagnosis: Unsteadiness on feet (R26.81);Muscle weakness (generalized) (M62.81);Pain   Activity Tolerance Patient tolerated treatment well   Patient Left in bed;with call bell/phone within reach   Nurse Communication Mobility status        Time: 0981-1914 OT Time Calculation (min): 14 min  Charges: OT General Charges $OT Visit: 1 Visit OT Treatments $Self Care/Home Management : 8-22 mins  05/27/2020  Rich, OTR/L  Acute Rehabilitation Services  Office:  [phone]    Suzanna Obey 05/27/2020, 3:47 PM

## 2020-05-27 NOTE — Progress Notes (Addendum)
Aortic Surgery Progress Note    05/27/2020 7:10 AM 2 Days Post-Op  Subjective:  Feels tired; says he has passed gas.  Had some chicken broth yesterday and tolerated. Says he has had a BM since surgery.  Tm 100.2 HR 80's-100's NSR 21'H-086'V systolic 78% RA   Gtts:  none  Vitals:   05/27/20 0606 05/27/20 0700  BP: (!) 119/98 (!) 105/58  Pulse: (!) 102 95  Resp: (!) 26 18  Temp:    SpO2: 95% 95%    Physical Exam: Cardiac:  regular Lungs:  Non labored Abdomen:  Mildly distended; +BS; +flatus Incisions:  Laparotomy incision is clean and in tact Extremities:  Easily palpable DP pulses bilaterally. General:  No distress  CBC    Component Value Date/Time   WBC 12.2 (H) 05/27/2020 0333   RBC 2.78 (L) 05/27/2020 0333   HGB 8.9 (L) 05/27/2020 0333   HCT 26.3 (L) 05/27/2020 0333   PLT 218 05/27/2020 0333   MCV 94.6 05/27/2020 0333   MCH 32.0 05/27/2020 0333   MCHC 33.8 05/27/2020 0333   RDW 14.7 05/27/2020 0333   LYMPHSABS 0.8 05/24/2020 0602   MONOABS 2.7 (H) 05/24/2020 0602   EOSABS 0.0 05/24/2020 0602   BASOSABS 0.0 05/24/2020 0602    BMET    Component Value Date/Time   NA 133 (L) 05/27/2020 0333   K 2.7 (LL) 05/27/2020 0333   CL 97 (L) 05/27/2020 0333   CO2 28 05/27/2020 0333   GLUCOSE 123 (H) 05/27/2020 0333   BUN 6 05/27/2020 0333   CREATININE 1.00 05/27/2020 0333   CALCIUM 8.1 (L) 05/27/2020 0333   GFRNONAA >60 05/27/2020 0333   GFRAA >60 11/24/2019 1220    INR    Component Value Date/Time   INR 1.1 05/22/2020 1424     Intake/Output Summary (Last 24 hours) at 05/27/2020 0710 Last data filed at 05/27/2020 0700 Gross per 24 hour  Intake 656.17 ml  Output 1550 ml  Net -893.83 ml     Assessment/Plan:  55 y.o. male is s/p  Open abdominal aortic aneurysm repair (22m Dacron tube graft) 2 Days Post-Op  -Vascular:  Easily palpable DP pulses bilaterally -Cardiac:  Hemodynamically stable and not on pressors -Pulmonary:  Extubated yesterday.   Doing well on [place]-discussed with pulmonary-ok to transfer to progressive unit and continue pulmonary toilet.  -Neuro:  In tact -Renal:  Renal function looks good with good uop -GI:  Endoscopy reveals esophagitis-pt will need protonix bid for 8 weeks and carafate tid has been added to regimen.  Tolerated chicken broth yesterday.  Denies any nausea or vomiting. Discussed with him to take eating slow.  If he has any issues to stop eating.  -Incisions:  Healing nicely -Heme/ID:  Acute blood loss anemia-down slightly from yesterday but stable.  Blood cultures not resulted.  He did have Tm of 100.2 overnight.  Continue IS.  Still with leukocytosis but down from yesterday. -General:  No distress.    -mobilize pt today [facility] [phone]/19/2022 7:10 AM  I have seen and evaluated the patient. I agree with the PA note as documented above.  Postop day 5 status post open AAA repair with tube graft.  He has done very well overnight.  Tolerated clear liquids.  Not having any abdominal pain this morning.  Will advance to regular diet.  No more bloody bowel movements.  Is on Protonix for severe esophagitis after EGD.  Plan  is to transfer to the floor.  Overall looks good.  Palpable pedal pulses.  Midline incision looks good.  White blood cells 12.  No fevers in past 24 hours.  Hemoglobin stable 8.9.  No growth on BC from couple days ago.  Normal flora on BAL culture.  Marty Heck, MD [facility] [place] Office: [phone]

## 2020-05-28 LAB — BASIC METABOLIC PANEL
Anion gap: 9 (ref 5–15)
BUN: 8 mg/dL (ref 6–20)
CO2: 26 mmol/L (ref 22–32)
Calcium: 8.1 mg/dL — ABNORMAL LOW (ref 8.9–10.3)
Chloride: 102 mmol/L (ref 98–111)
Creatinine, Ser: 1.24 mg/dL (ref 0.61–1.24)
GFR, Estimated: >60 mL/min (ref >60)
Glucose, Bld: 133 mg/dL — ABNORMAL HIGH (ref 70–99)
Potassium: 3.6 mmol/L (ref 3.5–5.1)
Sodium: 137 mmol/L (ref 135–145)

## 2020-05-28 LAB — CBC
HCT: 26.5 % — ABNORMAL LOW (ref 39.0–52.0)
Hemoglobin: 9 g/dL — ABNORMAL LOW (ref 13.0–17.0)
MCH: 32 pg (ref 26.0–34.0)
MCHC: 34 g/dL (ref 30.0–36.0)
MCV: 94.3 fL (ref 80.0–100.0)
Platelets: 243 10*3/uL (ref 150–400)
RBC: 2.81 MIL/uL — ABNORMAL LOW (ref 4.22–5.81)
RDW: 14.4 % (ref 11.5–15.5)
WBC: 14.6 10*3/uL — ABNORMAL HIGH (ref 4.0–10.5)
nRBC: 0 % (ref 0.0–0.2)

## 2020-05-28 MED ORDER — SODIUM CHLORIDE 0.9 % IV SOLN: INTRAVENOUS | Status: DC

## 2020-05-28 NOTE — Progress Notes (Addendum)
Progress Note    05/28/2020 7:02 AM 6 Days Post-Op  Subjective:  Says he feels better.  Ate a big dinner with pot roast, vegetables and angel food cake-he ate his entire dinner-did not have any nausea or vomiting.  Denies any bloody bowel movements. Continues to have gas.  Walked in the hallways yesterday.  Afebrile HR 70's-100's NSR 57'S-177'L systolic 39% [id number]  Vitals:   05/28/20 0214 05/28/20 0414  BP: 91/65 109/69  Pulse: 82 86  Resp: 16 20  Temp: 98.3 F (36.8 C) 98 F (36.7 C)  SpO2: 95% 94%    Physical Exam: Cardiac:  Regular Lungs:  Non labored.   Incisions:  Laparotomy incision is healing nicely. Extremities:  Easily palpable DP pulses bilaterally Abdomen:  Soft, NT/ND; +flatus  CBC    Component Value Date/Time   WBC 14.6 (H) 05/28/2020 0055   RBC 2.81 (L) 05/28/2020 0055   HGB 9.0 (L) 05/28/2020 0055   HCT 26.5 (L) 05/28/2020 0055   PLT 243 05/28/2020 0055   MCV 94.3 05/28/2020 0055   MCH 32.0 05/28/2020 0055   MCHC 34.0 05/28/2020 0055   RDW 14.4 05/28/2020 0055   LYMPHSABS 0.8 05/24/2020 0602   MONOABS 2.7 (H) 05/24/2020 0602   EOSABS 0.0 05/24/2020 0602   BASOSABS 0.0 05/24/2020 0602    BMET    Component Value Date/Time   NA 137 05/28/2020 0055   K 3.6 05/28/2020 0055   CL 102 05/28/2020 0055   CO2 26 05/28/2020 0055   GLUCOSE 133 (H) 05/28/2020 0055   BUN 8 05/28/2020 0055   CREATININE 1.24 05/28/2020 0055   CALCIUM 8.1 (L) 05/28/2020 0055   GFRNONAA >60 05/28/2020 0055   GFRAA >60 11/24/2019 1220    INR    Component Value Date/Time   INR 1.1 05/22/2020 1424     Intake/Output Summary (Last 24 hours) at 05/28/2020 3300 Last data filed at 05/27/2020 2050 Gross per 24 hour  Intake 384.01 ml  Output 775 ml  Net -390.99 ml     Assessment:  55 y.o. male is s/p:  Open abdominal aortic aneurysm repair (18m Dacron tube graft)  6 Days Post-Op  Plan: -pt doing well this morning and tolerated full meal last night. -increase  in creatinine from0.8>0.9> 1.0>1.24 today.  May need to check labs tomorrow to make sure it does not continue to trend upward.  Discussed with pt.  Will start gentle hydration and check labs in am -pt afebrile and blood cultures have NGTD.  His white count up slightly from yesterday at 14.6 from 12.2.  Incision looks fine.  Encouraged pt to continue IS.  -hypokalemia improved after supplementation  -continue ambulation.  -DVT prophylaxis:  Sq heparin -continue asa/statin -continue protonix bid for severe esophagitis -will consult TOC for HH needs.    SLeontine Locket PA-C [facility] [phone]/20/2022 7:02 AM  I have seen and evaluated the patient. I agree with the PA note as documented above.  Postop day 6 status post open AAA repair with tube graft.  Looks good today and tolerated regular diet overnight.  Not having any abdominal pain.  His incisions look good.  He has palpable pedal pulses.  He has a slight increase in his creatinine to 1.24 today and we will start some gentle hydration with IV fluids at 50 mL an hour.  Hemoglobin stable at 9.0.  No fevers in last 24 hours.  He remains on his PPI after EGD earlier this week and has  had no more bloody bowel movements.  No growth on respiratory culture from BAL either.  Marty Heck, MD [facility] [place] Office: [phone]

## 2020-05-28 NOTE — Progress Notes (Signed)
Physical Therapy Treatment Patient Details Name: Justin Poole MRN: [id number] DOB: 1965/09/03 Today's Date: 05/28/2020    History of Present Illness 55 y.o. male with a infrarenal abdominal aortic aneurysm measuring 60mm in greatest orthogonal dimension. Other PMH includes GERD, HTN, LBBB, pancreatitis. Pt underwent Open abdominal aortic aneurysm repair on 05/22/2020. Transferred to 4E on 1/19.    PT Comments    Pt supine in bed on arrival drenches in sweat.  Pt required gown change.  Focused on gt training and he continues to make great progress.  Pt continues to benefit from HHPT f/u for home safety eval.  Plan for stair training this session.     Follow Up Recommendations  Home health PT;Supervision - Intermittent (may progress to no needs.)     Equipment Recommendations  Rolling walker with 5" wheels    Recommendations for Other Services       Precautions / Restrictions Precautions Precautions: Fall Precaution Comments: O2 Restrictions Weight Bearing Restrictions: No    Mobility  Bed Mobility Overal bed mobility: Modified Independent                Transfers Overall transfer level: Needs assistance Equipment used: Rolling walker (2 wheeled) Transfers: Sit to/from Stand Sit to Stand: Supervision         General transfer comment: Cues for hand placement as pt reaching to pull on RW for support.  Ambulation/Gait Ambulation/Gait assistance: Min guard Gait Distance (Feet): 300 Feet Assistive device: Rolling walker (2 wheeled) Gait Pattern/deviations: Decreased stride length;Step-through pattern;Trunk flexed;Drifts right/left Gait velocity: reduced   General Gait Details: Pt tolerated gt training well.  Cues for scap retraction and upper trunk control.  Continues to use RW for balance and energy conservation.   Stairs             Wheelchair Mobility    Modified Rankin (Stroke Patients Only)       Balance Overall balance assessment: Needs  assistance Sitting-balance support: No upper extremity supported;Feet supported Sitting balance-Leahy Scale: Good       Standing balance-Leahy Scale: Fair                              Cognition Arousal/Alertness: Awake/alert Behavior During Therapy: WFL for tasks assessed/performed Overall Cognitive Status: Within Functional Limits for tasks assessed                                        Exercises      General Comments        Pertinent Vitals/Pain Pain Assessment: Faces Faces Pain Scale: Hurts a little bit Pain Location: abdomen Pain Descriptors / Indicators: Tender;Tightness Pain Intervention(s): Monitored during session;Repositioned    Home Living                      Prior Function            PT Goals (current goals can now be found in the care plan section) Acute Rehab PT Goals Patient Stated Goal: To get better Potential to Achieve Goals: Good Progress towards PT goals: Progressing toward goals    Frequency    Min 3X/week      PT Plan Current plan remains appropriate    Co-evaluation              AM-PAC PT "6 Clicks" Mobility  Outcome Measure  Help needed turning from your back to your side while in a flat bed without using bedrails?: A Little Help needed moving from lying on your back to sitting on the side of a flat bed without using bedrails?: A Little Help needed moving to and from a bed to a chair (including a wheelchair)?: A Little Help needed standing up from a chair using your arms (e.g., wheelchair or bedside chair)?: A Little Help needed to walk in hospital room?: A Little Help needed climbing 3-5 steps with a railing? : A Lot 6 Click Score: 17    End of Session Equipment Utilized During Treatment: Gait belt Activity Tolerance: Patient tolerated treatment well Patient left: in chair;with call bell/phone within reach Nurse Communication: Mobility status PT Visit Diagnosis: Unsteadiness on  feet (R26.81);Other abnormalities of gait and mobility (R26.89);Pain     Time: 5732-2025 PT Time Calculation (min) (ACUTE ONLY): 17 min  Charges:  $Gait Training: 8-22 mins                     Bonney Leitz , PTA Acute Rehabilitation Services Pager [phone] Office [phone]     Juron Vorhees Artis Delay 05/28/2020, 4:06 PM

## 2020-05-28 NOTE — Plan of Care (Signed)
  Problem: Education: Goal: Knowledge of the prescribed therapeutic regimen will improve Outcome: Progressing   Problem: Bowel/Gastric: Goal: Gastrointestinal status for postoperative course will improve Outcome: Progressing   Problem: Cardiac: Goal: Ability to maintain an adequate cardiac output will improve Outcome: Progressing   Problem: Clinical Measurements: Goal: Postoperative complications will be avoided or minimized Outcome: Progressing   Problem: Respiratory: Goal: Respiratory status will improve Outcome: Progressing   Problem: Skin Integrity: Goal: Demonstration of wound healing without infection will improve Outcome: Progressing   Problem: Urinary Elimination: Goal: Ability to achieve and maintain adequate renal perfusion and functioning will improve Outcome: Progressing   Problem: Education: Goal: Knowledge of General Education information will improve Description: Including pain rating scale, medication(s)/side effects and non-pharmacologic comfort measures Outcome: Progressing   Problem: Health Behavior/Discharge Planning: Goal: Ability to manage health-related needs will improve Outcome: Progressing   Problem: Clinical Measurements: Goal: Ability to maintain clinical measurements within normal limits will improve Outcome: Progressing Goal: Will remain free from infection Outcome: Progressing Goal: Diagnostic test results will improve Outcome: Progressing Goal: Respiratory complications will improve Outcome: Progressing Goal: Cardiovascular complication will be avoided Outcome: Progressing   Problem: Activity: Goal: Risk for activity intolerance will decrease Outcome: Progressing   Problem: Nutrition: Goal: Adequate nutrition will be maintained Outcome: Progressing   Problem: Coping: Goal: Level of anxiety will decrease Outcome: Progressing   Problem: Elimination: Goal: Will not experience complications related to bowel motility Outcome:  Progressing Goal: Will not experience complications related to urinary retention Outcome: Progressing   Problem: Pain Managment: Goal: General experience of comfort will improve Outcome: Progressing   Problem: Safety: Goal: Ability to remain free from injury will improve Outcome: Progressing   Problem: Skin Integrity: Goal: Risk for impaired skin integrity will decrease Outcome: Progressing   Problem: Safety: Goal: Non-violent Restraint(s) Outcome: Progressing   

## 2020-05-28 NOTE — TOC Initial Note (Signed)
Transition of Care (TOC) - Initial/Assessment Note  Donn Pierini RN, BSN Transitions of Care Unit 4E- RN Case Manager See Treatment Team for direct phone #    Patient Details  Name: Justin Poole MRN: [id number] Date of Birth: March 24, 1966  Transition [facility]) CM/SW Contact:    Darrold Span, RN Phone Number: 05/28/2020, 3:17 PM  Clinical Narrative:                 Pt s/p AAA repair, noted referral for [facility] and DME needs. Pt is un-insured and will need [facility] referral- agency this week is [facility]- Call made to [employer] with [facility] regarding HHPT charity referral. Brayton El to f/u and see if pt qualifies for charity HH. - HHPT needs.  Order for DME-RW- call made to [employer] line- for DME referral- they will reach out to patient regarding RW   Attempted to speak with pt - however pt was taking a nap so will come back later to speak with pt regarding transition needs/plans.    Expected Discharge Plan: Home w Home Health Services Barriers to Discharge: Continued Medical Work up   Patient Goals and CMS Choice     Choice offered to / list presented to : NA  Expected Discharge Plan and Services Expected Discharge Plan: Home w Home Health Services   Discharge Planning Services: CM Consult Post Acute Care Choice: Home Health Living arrangements for the past 2 months: Apartment                 DME Arranged: Walker rolling DME Agency: [employer] Date DME Agency Contacted: 05/28/20 Time DME Agency Contacted: 1400 Representative spoke with at DME Agency: Velna Hatchet HH Arranged: PT HH Agency: [facility] Date [facility] Agency Contacted: 05/28/20 Time HH Agency Contacted: 1430 Representative spoke with at [facility] Agency: Britney  Prior Living Arrangements/Services Living arrangements for the past 2 months: Apartment Lives with:: Self Patient language and need for interpreter reviewed:: Yes Do you feel safe going back to the place where you live?: Yes      Need for Family  Participation in Patient Care: Yes (Comment) Care giver support system in place?: Yes (comment)   Criminal Activity/Legal Involvement Pertinent to Current Situation/Hospitalization: No - Comment as needed  Activities of Daily Living Home Assistive Devices/Equipment: Eyeglasses ADL Screening (condition at time of admission) Patient's cognitive ability adequate to safely complete daily activities?: Yes Is the patient deaf or have difficulty hearing?: No Does the patient have difficulty seeing, even when wearing glasses/contacts?: No Does the patient have difficulty concentrating, remembering, or making decisions?: No Patient able to express need for assistance with ADLs?: Yes Does the patient have difficulty dressing or bathing?: No Independently performs ADLs?: Yes (appropriate for developmental age) Does the patient have difficulty walking or climbing stairs?: No Weakness of Legs: None Weakness of Arms/Hands: None  Permission Sought/Granted Permission sought to share information with : Magazine features editor                Emotional Assessment       Orientation: : Oriented to Self,Oriented to Place,Oriented to  Time,Oriented to Situation   Psych Involvement: No (comment)  Admission diagnosis:  AAA (abdominal aortic aneurysm) (HCC) [I71.4] Patient Active Problem List   Diagnosis Date Noted  . Hematemesis with nausea   . Acute blood loss anemia   . Heartburn   . Leukocytosis   . Alcohol use 06/09/2018  . Essential hypertension 06/09/2018  . GERD (gastroesophageal reflux disease)  06/09/2018  . AAA (abdominal aortic aneurysm) (HCC) 06/09/2018  . Acute pancreatitis 06/08/2018  . Hypercalcemia 06/08/2018  . Hypokalemia 06/08/2018   PCP:  Pcp, No Pharmacy:   [facility] [address], [place] - [address] [place] [address] Phone: [phone] Fax: [phone]  [id number] [address] [place] -  [address] [place] [address] Phone: [phone] Fax: [phone]     Social Determinants of Health (SDOH) Interventions    Readmission Risk Interventions No flowsheet data found.

## 2020-05-29 LAB — CBC
HCT: 29.3 % — ABNORMAL LOW (ref 39.0–52.0)
Hemoglobin: 9.5 g/dL — ABNORMAL LOW (ref 13.0–17.0)
MCH: 30.8 pg (ref 26.0–34.0)
MCHC: 32.4 g/dL (ref 30.0–36.0)
MCV: 95.1 fL (ref 80.0–100.0)
Platelets: 356 10*3/uL (ref 150–400)
RBC: 3.08 MIL/uL — ABNORMAL LOW (ref 4.22–5.81)
RDW: 14.2 % (ref 11.5–15.5)
WBC: 14 10*3/uL — ABNORMAL HIGH (ref 4.0–10.5)
nRBC: 0 % (ref 0.0–0.2)

## 2020-05-29 LAB — BPAM RBC
Blood Product Expiration Date: 202202152359
Blood Product Expiration Date: 202202152359
ISSUE DATE / TIME: 202201140832
ISSUE DATE / TIME: 202201170556
Unit Type and Rh: 5100
Unit Type and Rh: 5100

## 2020-05-29 LAB — TYPE AND SCREEN
ABO/RH(D): O POS
Antibody Screen: NEGATIVE
Unit division: 0
Unit division: 0

## 2020-05-29 LAB — BASIC METABOLIC PANEL
Anion gap: 10 (ref 5–15)
BUN: 9 mg/dL (ref 6–20)
CO2: 27 mmol/L (ref 22–32)
Calcium: 8.2 mg/dL — ABNORMAL LOW (ref 8.9–10.3)
Chloride: 98 mmol/L (ref 98–111)
Creatinine, Ser: 1.01 mg/dL (ref 0.61–1.24)
GFR, Estimated: >60 mL/min (ref >60)
Glucose, Bld: 141 mg/dL — ABNORMAL HIGH (ref 70–99)
Potassium: 3.2 mmol/L — ABNORMAL LOW (ref 3.5–5.1)
Sodium: 135 mmol/L (ref 135–145)

## 2020-05-29 MED ORDER — MAGNESIUM CITRATE PO SOLN
300.0000 mL | Freq: Once | ORAL | Status: AC
  Administered 2020-05-29: 300 mL via ORAL
  Filled 2020-05-29: qty 592

## 2020-05-29 MED ORDER — POTASSIUM CHLORIDE CRYS ER 20 MEQ PO TBCR
40.0000 meq | EXTENDED_RELEASE_TABLET | Freq: Once | ORAL | Status: AC
  Administered 2020-05-29: 40 meq via ORAL
  Filled 2020-05-29: qty 2

## 2020-05-29 NOTE — Discharge Summary (Signed)
Open Aortic Surgery Discharge Summary    Justin Poole 24-Aug-1965 55 y.o. male  [id number]  Admission Date: 05/22/2020  Discharge Date: 05/30/2020  Physician: No att. providers found  Admission Diagnosis: AAA (abdominal aortic aneurysm) ([place]) [I71.4]   HPI:   This is a 55 y.o. male male with a infrarenal abdominal aortic aneurysm measuring 20m in greatest orthogonal dimension.  A statement from the [employer] and Society for Vascular Surgery estimated the annual rupture risk according to AAA diameter to be the following: 5.0 cm to 5.9 cm in diameter - 3% to 15%  The patient is good a candidate for elective repair of the aneurysm to prevent rupture.  I explained the risks / benefits / alternatives to different approaches to aortic reconstruction.   I explained the specific benefits of open repair including improved durability, limited requirement for surveillance, less need for secondary intervention. I explained the specific risks from open repair including higher physiologic stress from aortic cross clamping, higher risk of perioperative complication (including stroke, MI, pneumonia, renal insufficiency and failure, death, etc.), higher risk of abdominal wall complications, risk of anastomotic pseudoaneurysm.  I explained the specific benefits of endovascular repair including limited physiologic stress and less recovery time, lower risk of serious perioperative complication, zero risk of abdominal wall complication.  I explained the specific risks from endovascular repair including need for lifetime surveillance, risk of large-bore arterial access, risk of requiring secondary intervention to maintain seal or patency. I explained that not all patients are candidates for endovascular repair based on their unique anatomy.  After detailed discussion the patient desires open repair.    The patient should continue best medical  therapy including: Complete cessation from all tobacco products. Excellent blood glucose control with goal A1c < 7%. Blood pressure control with goal blood pressure < 140/90 mmHg. Lipid reduction therapy with goal LDL-C <100 mg/dL. Aspirin 834mPO QD.  Atorvastatin 40-80mg PO QD (or other "high intensity" statin therapy).  Will plan for open infrarenal aortic aneurysm repair today with me and my partner Dr. EaDonnetta Poole  Hospital Course:  The patient was admitted to the hospital and taken to the operating room on 05/22/2020  and underwent: Open abdominal aortic aneurysm repair (169macron tube graft)    Findings: adequate neck for infrarenal control. Distal control at common iliac arteries. Tube graft repair with 26m105mcron. Hemostatic at completion.  The pt tolerated the procedure well and was transported to the PACU in good condition.   By POD 1, he was doing well.  His foley, central line was discontinued.  He was kept in ICU.   Later that day, he was having increasing respiratory rate and oxygen requirements.  Chest x-ray demonstrates what appears to be left lung atelectasis as the underlying cause.  He is currently resting comfortably on BiPAP.  Critical care medicine evaluation and treatment of this patient much appreciated.  POD 2, his breathing was improved.  He had 2 bouts with emesis.  BM night of POD 1.  KUB obtained.  Later in the day, pt had a tarry bowel movement and was noted to have a bowel movement last night that was nonbloody.  KUB does not demonstrate any signs of blockage.  He does have left greater than right lower quadrant tenderness to palpation and a leukocytosis concerning for colonic ischemia.  We will continue to hydrate with supportive measures at this time.   POD 3, it was felt he  had ischemic colitis and a GI consult was obtained.  He was started on abx, IVF, cipro, flagyl.  Pt underwent bronchoscopy that day and findings as follows: - Extensive tenacious mucus  plugs extending from distal left mainstem downward successfully suctioned down to subsegmental level - BAL in LLL with return of mucus-plug filled fluid - Underlying chronic bronchiitic changes - No endobronchial lesions  He also underwent endoscopy that revealed: LA Grade D (one or more mucosal breaks involving at least 75% of       esophageal circumference) esophagitis with no bleeding was found in the       entire esophagus. There were a few small flat black spots indicative of       recent bleeding.      The stomach was normal except for a few small spots of recent OGT       suction trauma.      The cardia and gastric fundus were normal on retroflexion.      The examined duodenum was normal. Impression:                - LA Grade D reflux esophagitis with no bleeding.  This is severe chronic ulcerated reflux esophagitis and was the source of bleeding.  - Normal stomach.  - Normal examined duodenum.  He was started on IV Protonix until the next day and started on protonix po bid.  He was also counseled to quit smoking.   POD 4, he remained on the vent but was following commands. He continued to have palpable DP pulses bilaterally.  He was extubated.  CCM started him on a short course of Prednisone as well as neb and Singular, continue IS and flutter valve and ambulate.  Per GI:  He has chronic reflux related epigastric pain and pyrosis.  Has odynophagia without dysphagia.  Endoscopy findings conveyed to him since he is now alert and conversational after extubation. No ongoing overt GI bleeding.  He will be converted from IV Protonix to 40 mg by mouth twice daily for 8 weeks. We have added Carafate 3 times daily with meals to relieve odynophagia. When he passes postextubation swallow test, okay for soft diet, which she should continue for a week and then resume regular diet.  POD 5, he was doing well.  He was transferred to the progressive unit.  He was tolerating diet and this was  advanced.  He did have acute blood loss anemia and this was stable.  Blood cx were NGTD.  Continue IS and ambulate.  PT recommending HHPT and RW.   POD 6, tolerated regular diet.  Continues to have easily palpable DP pulses bilaterally.  He continues to pass flatus.  His renal function was increased.  He was started on gentle hydration.    POD 7, pt continues to do well.  He has not had BM in a couple of days.  Mag Citrate ordered.  Renal function improved and gentle IVF discontinued.  Encouraged him to continue walking and OOB.  Blood cx continue to be NGTD.   POD 8, he was doing well and discharged home.    CBC    Component Value Date/Time   WBC 11.0 (H) 05/30/2020 0217   RBC 3.17 (L) 05/30/2020 0217   HGB 10.1 (L) 05/30/2020 0217   HCT 29.9 (L) 05/30/2020 0217   PLT 488 (H) 05/30/2020 0217   MCV 94.3 05/30/2020 0217   MCH 31.9 05/30/2020 0217   MCHC 33.8 05/30/2020 0217  RDW 14.0 05/30/2020 0217   LYMPHSABS 0.8 05/24/2020 0602   MONOABS 2.7 (H) 05/24/2020 0602   EOSABS 0.0 05/24/2020 0602   BASOSABS 0.0 05/24/2020 0602    BMET    Component Value Date/Time   NA 135 05/30/2020 0217   K 4.0 05/30/2020 0217   CL 97 (L) 05/30/2020 0217   CO2 29 05/30/2020 0217   GLUCOSE 137 (H) 05/30/2020 0217   BUN 9 05/30/2020 0217   CREATININE 0.83 05/30/2020 0217   CALCIUM 8.5 (L) 05/30/2020 0217   GFRNONAA >60 05/30/2020 0217   GFRAA >60 11/24/2019 1220       Discharge Diagnosis:  AAA (abdominal aortic aneurysm) ([place]) [I71.4]  Secondary Diagnosis: Patient Active Problem List   Diagnosis Date Noted  . Hematemesis with nausea   . Acute blood loss anemia   . Heartburn   . Leukocytosis   . Alcohol use 06/09/2018  . Essential hypertension 06/09/2018  . GERD (gastroesophageal reflux disease) 06/09/2018  . AAA (abdominal aortic aneurysm) ([place]) 06/09/2018  . Acute pancreatitis 06/08/2018  . Hypercalcemia 06/08/2018  . Hypokalemia 06/08/2018   Past Medical History:   Diagnosis Date  . AAA (abdominal aortic aneurysm) ([place])   . Asthma    as a child  . Dysrhythmia    pt states they have told him he "has an irregular heart beat"  . GERD (gastroesophageal reflux disease)   . Hypertension   . Left bundle branch block   . Pancreatitis    hospitalization 06/2018 for acute pancreatitis, likely relatead to alcohol use     Allergies as of 05/30/2020   No Known Allergies     Medication List    TAKE these medications   amLODipine 5 MG tablet Commonly known as: NORVASC Take 1 tablet (5 mg total) by mouth daily.   aspirin 81 MG EC tablet Take 1 tablet (81 mg total) by mouth daily at 6 (six) AM. Swallow whole.   levalbuterol 0.63 MG/3ML nebulizer solution Commonly known as: XOPENEX Take 3 mLs (0.63 mg total) by nebulization every 6 (six) hours as needed for wheezing or shortness of breath.   metoprolol tartrate 25 MG tablet Commonly known as: LOPRESSOR Take 1 tablet (25 mg total) by mouth 2 (two) times daily.   montelukast 10 MG tablet Commonly known as: SINGULAIR Take 1 tablet (10 mg total) by mouth at bedtime.   nicotine 21 mg/24hr patch Commonly known as: NICODERM CQ - dosed in mg/24 hours Place 21 mg onto the skin daily.   oxyCODONE-acetaminophen 5-325 MG tablet Commonly known as: Percocet Take 1 tablet by mouth every 6 (six) hours as needed.   pantoprazole 40 MG tablet Commonly known as: PROTONIX Take 1 tablet (40 mg total) by mouth 2 (two) times daily.   ROLAIDS PO Take 1 tablet by mouth daily as needed (acid reflux).   rosuvastatin 40 MG tablet Commonly known as: CRESTOR Take 1 tablet (40 mg total) by mouth daily.   sucralfate 1 GM/10ML suspension Commonly known as: CARAFATE Take 10 mLs (1 g total) by mouth 4 (four) times daily -  with meals and at bedtime.       Instructions:  [facility] Discharge Instructions   Open Aortic Surgery  Please refer to the following instructions for  your post-procedure care. Your surgeon or Physician Assistant will discuss any changes with you.  Activity  Avoid lifting more than eight pounds (a gallon of milk) until after your first post-operative visit. You are encouraged  to walk as much as you can. You can slowly return to normal activities but must avoid strenuous activity and heavy lifting until your doctor tells you it's okay. Heavy lifting can hurt the incision and cause a hernia. Avoid activities such as vacuuming or swinging a golf club. It is normal to feel tired for several weeks after your surgery. Do not drive until your doctor gives the okay and you are no longer taking prescription pain medications. It is also normal to have difficulty with sleep habits, eating and bowl movements after surgery. These will go away with time.  Bathing/Showering  Shower daily after you go home. Do not soak in a bathtub, hot tub, or swim until the incision heals.  Incision Care  Shower every day. Clean your incision with mild soap and water. Pat the area dry with a clean towel. You do not need a bandage unless otherwise instructed. Do not apply any ointments or creams to your incision. You may have skin glue on your incision. Do not peel it off. It will come off on its own in about one week. If you have staples or sutures along your incision, they will be removed at your post op appointment.  If you have groin incisions, wash the groin wounds with soap and water daily and pat dry. (No tub bath-only shower)  Then put a dry gauze or washcloth in the groin to keep this area dry to help prevent wound infection.  Do this daily and as needed.  Do not use Vaseline or neosporin on your incisions.  Only use soap and water on your incisions and then protect and keep dry.  Diet  Resume your normal diet. There are no special food restriction following this procedure. A low fat/low cholesterol diet is recommended for all patients with vascular disease. After your  aortic surgery, it's normal to feel full faster than usual and to not feel as hungry as you normally would. You will probably lose weight initially following your surgery. It's best to eat small, frequent meals over the course of the day. Call the office if you find that you are unable to eat even small meals.   In order to heal from your surgery, it is CRITICAL to get adequate nutrition. Your body requires vitamins, minerals, and protein. Vegetables are the best source of vitamins and minerals.  If you have pain, you may take over-the-counter pain reliever such as acetaminophen (Tylenol). If you were prescribed a stronger pain medication, please be aware these medication can cause nausea and constipation. Prevent nausea by taking the medication with a snack or meal. Avoid constipation by drinking plenty of fluids and eating foods with a high amount of fiber, such as fruits, vegetables and grains. Take 157m of the over-the-counter stool softener Colace twice a day as needed to help with constipation. A laxative, such as Milk of Magnesia, may be recommended for you at this time. Do not take a laxative unless your surgeon or P.A. tells you it's OK.  Do not take Tylenol if you are taking stronger pain medications (such as Percocet).  Follow Up  Our office will schedule a follow up appointment 2-3 weeks after discharge.  Please call [place]mediately for any of the following conditions    .     Severe or worsening pain in your legs or feet or in your abdomen back or chest. . Increased pain, redness drainage (pus) from your incision site. . Increased abdominal pain, bloating, nausea, vomiting, or  persistent diarrhea. . Fever of 101 degrees or higher. . Swelling in your leg (s). .  Reduce your risk of vascular disease  . Stop smoking. If you would like help, call [facility] at 1-800-QUIT-NOW (662)408-3311) or [place] at 657-707-8670. . Manage your cholesterol . Maintain a desired  weight . Control your diabetes . Keep your blood pressure down .  If you have any questions please call the office at [phone].   Prescriptions given: 1.  Roxicet #20 No Refill 2.  Aspirin 68m daily OTC 3.  Singular 119mdaily OTC 4.  Metoprolol 2567mid #60 two RF (refills per PCP) 5.  Protonix 19m50md #60 two RF (refills per PCP or GI) 6.  Xopenex nebulizer 7. Sucralfate   Disposition: home with HHPT  Patient's condition: is Good  Follow up: 1. Dr. HawkStanford Breed2-3 weeks   SamaLeontine Locket-C [facility] [phone]/2022  7:53 AM   - For VQI Registry use -  Post-op:  Time to Extubation: _0  In OR, _1  < 12 hrs, _2  12-24 hrs, _3  >=24 hrs Vasopressors Req. Post-op: No ICU Stay: 4 day in ICU Transfusion: Yes   If yes, 1 units given MI: No, _4  Troponin only, _5  EKG or Clinical New Arrhythmia: No  Complications: CHF: No Resp failure: Yes he did get re-intubated and bronchoscopy _6  Pneumonia, _7  Ventilator Chg in renal function: No, _8  Inc. Cr > 0.5, _9  Temp. Dialysis, _10  Permanent dialysis Leg ischemia: No, no Surgery needed, _11  Yes, Surgery needed, _12  Amputation Bowel ischemia: No, _13  Medical Rx, _14  Surgical Rx Wound complication: No, <Rem<[id number]>_6<\/WVPXTGGYIRSWNIOE>_70perficial separation/infection, _16  Return to OR Return to OR: No  Return to OR for bleeding: No Stroke: No, _17  Minor, _18  Major Melena:  S/p upper GI  Discharge medications: Statin use:  Yes  ASA use:  Yes   Plavix use:  No  Beta blocker use:  Yes  ACEI use:  No ARB use:  No CCB use:  Yes Coumadin use:  No

## 2020-05-29 NOTE — Progress Notes (Signed)
Physical Therapy Treatment and d/c Patient Details Name: Justin Poole MRN: [id number] DOB: 10/30/1965 Today's Date: 05/29/2020    History of Present Illness Pt is 55 y.o. male with a infrarenal abdominal aortic aneurysm measuring 81m in greatest orthogonal dimension. Other PMH includes GERD, HTN, LBBB, pancreatitis. Pt underwent Open abdominal aortic aneurysm repair on 05/22/2020. Transferred to 4E on 1/19.    PT Comments    Pt has met or nearly met goals.  He had supervision during therapy but was able to demonstrate safe transfers and gait.  Also, performed 5 steps safely.  Pts HR 100-125 bpm during therapy and up to 133 bpm during stairs.  No further acute PT indicated. Pt expecting to d/c tomorrow with room mate.    Follow Up Recommendations  No PT follow up;Supervision - Intermittent     Equipment Recommendations  Rolling walker with 5" wheels    Recommendations for Other Services       Precautions / Restrictions Precautions Precautions: None Precaution Comments: O2--did fine with O2 sats on RA in room with activities (94% at lowest) Restrictions Weight Bearing Restrictions: No    Mobility  Bed Mobility Overal bed mobility: Modified Independent Bed Mobility: Supine to Sit;Sit to Supine     Supine to sit: Modified independent (Device/Increase time) Sit to supine: Independent   General bed mobility comments: increased time out of bed with use of rail and HOB up  Transfers Overall transfer level: Independent Equipment used: None Transfers: Sit to/from Stand Sit to Stand: Independent Stand pivot transfers: Independent       General transfer comment: Had supervision with therapy but demonstrated safely and independently  Ambulation/Gait Ambulation/Gait assistance: Supervision Gait Distance (Feet): 350 Feet Assistive device: Rolling walker (2 wheeled) Gait Pattern/deviations: WFL(Within Functional Limits)     General Gait Details: Pt demonstrated safe gait  with normal pattern and speed.  HR 125 bpm with ambulation.   Stairs Stairs: Yes Stairs assistance: Supervision Stair Management: One rail Left;Alternating pattern Number of Stairs: 6 General stair comments: demonstrated safely   Wheelchair Mobility    Modified Rankin (Stroke Patients Only)       Balance Overall balance assessment: Modified Independent   Sitting balance-Leahy Scale: Normal       Standing balance-Leahy Scale: Good Standing balance comment: RW for ambulation for energy conservation, but able to move around in room and weight shift without UE support                            Cognition Arousal/Alertness: Awake/alert Behavior During Therapy: WFL for tasks assessed/performed Overall Cognitive Status: Within Functional Limits for tasks assessed                                        Exercises      General Comments General comments (skin integrity, edema, etc.): Pt on RA with O2 sats 95%.  HR 100-125 bpm , did go up to 133 bpm with stairs      Pertinent Vitals/Pain Pain Assessment: No/denies pain Pain Score: 5  Pain Location: abdomen Pain Descriptors / Indicators: Tender Pain Intervention(s): Monitored during session;Patient requesting pain meds-RN notified    Home Living                      Prior Function  PT Goals (current goals can now be found in the care plan section) Acute Rehab PT Goals Patient Stated Goal: to go home tomorrow PT Goal Formulation: All assessment and education complete, DC therapy Progress towards PT goals: Goals met/education completed, patient discharged from PT    Frequency           PT Plan Other (comment);Discharge plan needs to be updated (no further therapy needs)    Co-evaluation              AM-PAC PT "6 Clicks" Mobility   Outcome Measure  Help needed turning from your back to your side while in a flat bed without using bedrails?: None Help  needed moving from lying on your back to sitting on the side of a flat bed without using bedrails?: None Help needed moving to and from a bed to a chair (including a wheelchair)?: None Help needed standing up from a chair using your arms (e.g., wheelchair or bedside chair)?: None Help needed to walk in hospital room?: None Help needed climbing 3-5 steps with a railing? : A Little 6 Click Score: 23    End of Session   Activity Tolerance: Patient tolerated treatment well Patient left: in bed;with call bell/phone within reach Nurse Communication: Mobility status       Time: 7282-0601 PT Time Calculation (min) (ACUTE ONLY): 20 min  Charges:  $Gait Training: 8-22 mins                     Abran Richard, PT Acute Rehab Services Pager [phone] [facility] [place] 05/29/2020, 11:03 AM

## 2020-05-29 NOTE — TOC Progression Note (Signed)
Transition of Care (TOC) - Progression Note  Donn Pierini RN, BSN Transitions of Care Unit 4E- RN Case Manager See Treatment Team for direct phone #    Patient Details  Name: Justin Poole MRN: [id number] Date of Birth: 1965-07-19  Transition of Care [facility]) CM/SW Contact  Zenda Alpers, Lenn Sink, RN Phone Number: 05/29/2020, 2:32 PM  Clinical Narrative:    Notified by Rolene Arbour that pt has qualified for HHPT services, noted that PT/OT have both seen patient again today and updated their recommendations to no follow up needed. Spoke with pt at bedside regarding [facility] services- per pt he does not feel like he will need them. Confirmed RW has been delivered to room by [employer] for pt to take home.  Per pt sister will be available to transport home, she also usually assist pt with medications- TOC to follow should he need any assist with his meds on discharge.   Notified Britney with [facility] that HHPT services under [place] would not be needed at this time- can contact her should something change.    Expected Discharge Plan: Home w Home Health Services Barriers to Discharge: Continued Medical Work up  Expected Discharge Plan and Services Expected Discharge Plan: Home w Home Health Services   Discharge Planning Services: CM Consult Post Acute Care Choice: Home Health Living arrangements for the past 2 months: Apartment                 DME Arranged: Walker rolling DME Agency: [facility] Date DME Agency Contacted: 05/28/20 Time DME Agency Contacted: 1400 Representative spoke with at DME Agency: Velna Hatchet HH Arranged: PT HH Agency: [facility] Date [facility] Agency Contacted: 05/28/20 Time HH Agency Contacted: 1430 Representative spoke with at [facility] Agency: Britney   Social Determinants of Health (SDOH) Interventions    Readmission Risk Interventions No flowsheet data found.

## 2020-05-29 NOTE — Progress Notes (Addendum)
Progress Note    05/29/2020 7:20 AM 7 Days Post-Op  Subjective:  Says he hasn't had a bowel movement in a couple of days.  Says he doesn't have anyone to stay with him until tomorrow.   Afebrile HR 80's-100's NSR 388'E-280'K systolic 34% [id number]  Vitals:   05/28/20 2357 05/29/20 0412  BP: 102/66 102/87  Pulse: 81 76  Resp: 18 16  Temp: 98.1 F (36.7 C) 97.6 F (36.4 C)  SpO2: 96% 99%    Physical Exam: Cardiac:  Regular Lungs:  Non labored Incisions:  Midline incision is clean and dry and healing nicely. Extremities:  Easily palpable DP pulses bilaterally Abdomen:  Soft, NT/ND; +flatus; -BM in a couple of days.   CBC    Component Value Date/Time   WBC 14.0 (H) 05/29/2020 0147   RBC 3.08 (L) 05/29/2020 0147   HGB 9.5 (L) 05/29/2020 0147   HCT 29.3 (L) 05/29/2020 0147   PLT 356 05/29/2020 0147   MCV 95.1 05/29/2020 0147   MCH 30.8 05/29/2020 0147   MCHC 32.4 05/29/2020 0147   RDW 14.2 05/29/2020 0147   LYMPHSABS 0.8 05/24/2020 0602   MONOABS 2.7 (H) 05/24/2020 0602   EOSABS 0.0 05/24/2020 0602   BASOSABS 0.0 05/24/2020 0602    BMET    Component Value Date/Time   NA 135 05/29/2020 0147   K 3.2 (L) 05/29/2020 0147   CL 98 05/29/2020 0147   CO2 27 05/29/2020 0147   GLUCOSE 141 (H) 05/29/2020 0147   BUN 9 05/29/2020 0147   CREATININE 1.01 05/29/2020 0147   CALCIUM 8.2 (L) 05/29/2020 0147   GFRNONAA >60 05/29/2020 0147   GFRAA >60 11/24/2019 1220    INR    Component Value Date/Time   INR 1.1 05/22/2020 1424     Intake/Output Summary (Last 24 hours) at 05/29/2020 0720 Last data filed at 05/29/2020 0413 Gross per 24 hour  Intake 200 ml  Output 1150 ml  Net -950 ml     Assessment:  55 y.o. male is s/p:  Open abdominal aortic aneurysm repair.   7 Days Post-Op  Plan: -pt continues to have easily palpable DP pulses bilaterally -renal function improved and creatinine is back at 1.0.  IVF d/c'd this morning.  -pt has not had a BM in a couple of  days-does not want a suppository.  Will give some mag citrate this am.  -pt afebrile; leukocytosis slightly improved.  Blood cx remain NGTD -plan for discharge tomorrow.  He does not have anyone to stay with him today as his sister is in [place] and his roommate will be home tomorrow. -DVT prophylaxis:  Sq heparin   Leontine Locket, PA-C [facility] [phone] 05/29/2020 7:20 AM   I have seen and evaluated the patient. I agree with the PA note as documented above.  Postop day 7 status post open AAA repair with tube graft.  Overall looks pretty good this morning.  Tolerating regular food.  Denies any abdominal pain.  Midline incision looks good.  Has palpable DP pulses.  I question whether he is in A. fib with RVR this morning and have ordered an EKG.  Looking back at his cardiology preop evaluation looks like he was in sinus rhythm in the past and does have a known bundle branch block.  His main concern is not having a bowel movement will order some mag citrate.  No more bloody bowel movements after EGD earlier in the week showing severe reflux esophagitis and is  on Protonix which he is tolerating.  We discussed plans for discharge in the near future but states he has no one to stay with until tomorrow at the earliest when his roommate returns.  Leukocytosis stable 14, no fevers past 24 hours, no growth on BC or BAL from earlier in the week.  Hgb 9.5 stable.  Marty Heck, MD [facility] [place] Office: [phone]

## 2020-05-29 NOTE — Progress Notes (Signed)
Occupational Therapy Treatment and Discharge Patient Details Name: Justin Poole MRN: [id number] DOB: [birth year] Today's Date: 05/29/2020    History of present illness 54 y.o. male with a infrarenal abdominal aortic aneurysm measuring 68m in greatest orthogonal dimension. Other PMH includes GERD, HTN, LBBB, pancreatitis. Pt underwent Open abdominal aortic aneurysm repair on 05/22/2020. Transferred to 4E on 1/19.   OT comments  This 55yo male admitted with above presents to acute OT at an independent to Mod I level. No further OT needs, we will D/C from acute OT.  Follow Up Recommendations  No OT follow up    Equipment Recommendations  Other (comment) (non--pt reports he has a tub seat already)       Precautions / Restrictions Precautions Precautions: Fall Precaution Comments: O2--did fine with O2 sats on RA in room with activities (94% at lowest) Restrictions Weight Bearing Restrictions: No       Mobility Bed Mobility Overal bed mobility: Modified Independent Bed Mobility: Supine to Sit;Sit to Supine     Supine to sit: Modified independent (Device/Increase time) Sit to supine: Independent   General bed mobility comments: increased time out of bed with use of rail and HOB up  Transfers Overall transfer level: Independent Equipment used: None Transfers: Sit to/from Stand   Stand pivot transfers: Independent       General transfer comment: Pt able to stand up from bed, ambulate to bathroom, sit<>stand from toilet without grab bar    Balance Overall balance assessment: No apparent balance deficits (not formally assessed)                                         ADL either performed or assessed with clinical judgement   ADL Overall ADL's : Independent                                             Vision Baseline Vision/History: Wears glasses Patient Visual Report: No change from baseline            Cognition  Arousal/Alertness: Awake/alert Behavior During Therapy: WFL for tasks assessed/performed Overall Cognitive Status: Within Functional Limits for tasks assessed                                                     Pertinent Vitals/ Pain       Pain Assessment: 0-10 Pain Score: 5  Pain Location: abdomen Pain Descriptors / Indicators: Tender Pain Intervention(s): Monitored during session;Patient requesting pain meds-RN notified     Prior Functioning/Environment              Frequency  Min 2X/week        Progress Toward Goals  OT Goals(current goals can now be found in the care plan section)  Progress towards OT goals: Goals met/education completed, patient discharged from OT  Acute Rehab OT Goals Patient Stated Goal: to go home tomorrow OT Goal Formulation: With patient Time For Goal Achievement: 06/06/20 Potential to Achieve Goals: Good  Plan Equipment recommendations need to be updated       AM-PAC OT "6 Clicks" Daily Activity  Outcome Measure   Help from another person eating meals?: None Help from another person taking care of personal grooming?: None Help from another person toileting, which includes using toliet, bedpan, or urinal?: None Help from another person bathing (including washing, rinsing, drying)?: None Help from another person to put on and taking off regular upper body clothing?: None Help from another person to put on and taking off regular lower body clothing?: None 6 Click Score: 24    End of Session    OT Visit Diagnosis: Pain Pain - part of body:  (abdomen)   Activity Tolerance Patient tolerated treatment well   Patient Left in bed;with call bell/phone within reach   Nurse Communication Patient requests pain meds        Time: 2438-3654 OT Time Calculation (min): 10 min  Charges: OT General Charges $OT Visit: 1 Visit OT Treatments $Self Care/Home Management : 8-22 mins  [place], OTR/L Acute Rehab  Services Pager [phone] Office [phone]      Almon Register 05/29/2020, 10:04 AM

## 2020-05-30 LAB — BASIC METABOLIC PANEL
Anion gap: 9 (ref 5–15)
BUN: 9 mg/dL (ref 6–20)
CO2: 29 mmol/L (ref 22–32)
Calcium: 8.5 mg/dL — ABNORMAL LOW (ref 8.9–10.3)
Chloride: 97 mmol/L — ABNORMAL LOW (ref 98–111)
Creatinine, Ser: 0.83 mg/dL (ref 0.61–1.24)
GFR, Estimated: >60 mL/min (ref >60)
Glucose, Bld: 137 mg/dL — ABNORMAL HIGH (ref 70–99)
Potassium: 4 mmol/L (ref 3.5–5.1)
Sodium: 135 mmol/L (ref 135–145)

## 2020-05-30 LAB — CBC
HCT: 29.9 % — ABNORMAL LOW (ref 39.0–52.0)
Hemoglobin: 10.1 g/dL — ABNORMAL LOW (ref 13.0–17.0)
MCH: 31.9 pg (ref 26.0–34.0)
MCHC: 33.8 g/dL (ref 30.0–36.0)
MCV: 94.3 fL (ref 80.0–100.0)
Platelets: 488 10*3/uL — ABNORMAL HIGH (ref 150–400)
RBC: 3.17 MIL/uL — ABNORMAL LOW (ref 4.22–5.81)
RDW: 14 % (ref 11.5–15.5)
WBC: 11 10*3/uL — ABNORMAL HIGH (ref 4.0–10.5)
nRBC: 0 % (ref 0.0–0.2)

## 2020-05-30 MED ORDER — SUCRALFATE 1 GM/10ML PO SUSP: 1.0000 g | Freq: Three times a day (TID) | ORAL | 0 refills | Status: DC

## 2020-05-30 MED ORDER — LEVALBUTEROL HCL 0.63 MG/3ML IN NEBU: 0.6300 mg | INHALATION_SOLUTION | Freq: Four times a day (QID) | RESPIRATORY_TRACT | 12 refills | Status: DC | PRN

## 2020-05-30 MED ORDER — ASPIRIN 81 MG PO TBEC: 81.0000 mg | DELAYED_RELEASE_TABLET | Freq: Every day | ORAL | 11 refills | Status: DC

## 2020-05-30 MED ORDER — MONTELUKAST SODIUM 10 MG PO TABS: 10.0000 mg | ORAL_TABLET | Freq: Every day | ORAL | Status: DC

## 2020-05-30 MED ORDER — METOPROLOL TARTRATE 25 MG PO TABS: 25.0000 mg | ORAL_TABLET | Freq: Two times a day (BID) | ORAL | 2 refills | Status: DC

## 2020-05-30 MED ORDER — PANTOPRAZOLE SODIUM 40 MG PO TBEC: 40.0000 mg | DELAYED_RELEASE_TABLET | Freq: Two times a day (BID) | ORAL | 2 refills | Status: DC

## 2020-05-30 MED ORDER — OXYCODONE-ACETAMINOPHEN 5-325 MG PO TABS
1.0000 | ORAL_TABLET | Freq: Four times a day (QID) | ORAL | 0 refills | Status: DC | PRN
Start: 2020-05-30 — End: 2020-06-12

## 2020-05-30 NOTE — Progress Notes (Addendum)
  Progress Note    05/30/2020 8:19 AM 5 Days Post-Op  Subjective:  No complaints.  Wants to be discharged   Vitals:   05/30/20 0554 05/30/20 0752  BP: 120/73 139/81  Pulse: 72 93  Resp: 16 18  Temp: 97.8 F (36.6 C) 97.8 F (36.6 C)  SpO2: 98% 98%   Physical Exam: Lungs:  Non labored Incisions:  abd incision healing well Extremities:  Palpable DP pulses Abdomen:  Soft, NT, ND Neurologic: A&O  CBC    Component Value Date/Time   WBC 11.0 (H) 05/30/2020 0217   RBC 3.17 (L) 05/30/2020 0217   HGB 10.1 (L) 05/30/2020 0217   HCT 29.9 (L) 05/30/2020 0217   PLT 488 (H) 05/30/2020 0217   MCV 94.3 05/30/2020 0217   MCH 31.9 05/30/2020 0217   MCHC 33.8 05/30/2020 0217   RDW 14.0 05/30/2020 0217   LYMPHSABS 0.8 05/24/2020 0602   MONOABS 2.7 (H) 05/24/2020 0602   EOSABS 0.0 05/24/2020 0602   BASOSABS 0.0 05/24/2020 0602    BMET    Component Value Date/Time   NA 135 05/30/2020 0217   K 4.0 05/30/2020 0217   CL 97 (L) 05/30/2020 0217   CO2 29 05/30/2020 0217   GLUCOSE 137 (H) 05/30/2020 0217   BUN 9 05/30/2020 0217   CREATININE 0.83 05/30/2020 0217   CALCIUM 8.5 (L) 05/30/2020 0217   GFRNONAA >60 05/30/2020 0217   GFRAA >60 11/24/2019 1220    INR    Component Value Date/Time   INR 1.1 05/22/2020 1424     Intake/Output Summary (Last 24 hours) at 05/30/2020 4854 Last data filed at 05/30/2020 0604 Gross per 24 hour  Intake --  Output 675 ml  Net -675 ml     Assessment/Plan:  55 y.o. male is s/p open repair of AAA 5 Days Post-Op   BLE well perfused Tolerating regular diet Incision healing well Ok for discharge home however will check with sister to see if she is able to pick him up given the weather overnight  Emilie Rutter, PA-C [facility] [phone] 05/30/2020 8:19 AM  I have interviewed the patient and examined the patient. I agree with the findings by the PA.  Tolerating diet.  Pain well controlled.  Moving his bowels.   Agree with plans for discharge today.  Cari Caraway, MD [phone]

## 2020-05-31 LAB — CULTURE, BLOOD (ROUTINE X 2)
Culture: NO GROWTH
Culture: NO GROWTH
Special Requests: ADEQUATE
Special Requests: ADEQUATE

## 2020-06-01 ENCOUNTER — Ambulatory Visit: Payer: Self-pay | Admitting: Cardiology

## 2020-06-01 ENCOUNTER — Ambulatory Visit: Payer: Self-pay | Admitting: Nurse Practitioner

## 2020-06-01 ENCOUNTER — Other Ambulatory Visit: Payer: Self-pay

## 2020-06-01 ENCOUNTER — Telehealth: Payer: Self-pay

## 2020-06-01 NOTE — Telephone Encounter (Signed)
Transition Care Management Follow-up Telephone Call Date of discharge and from where:[facility] on 05/31/2019 How have you been since you were released from the hospital? [facility] getting better little SOB Any questions or concerns? No questions/concerns reported.  Items Reviewed: Did the pt receive and understand the discharge instructions provided? Stated that have the instructions and have no questions.  Medications obtained and verified? She said that they have the medication list  and the hospital staff reviewed them in detail prior to discharge. He said that he has to pick up the inhaler from pharmacy of the medications and they have no questions.  Any new allergies since your discharge? None reported  Do you have support at home? Yes, family Other (ie: DME, Home Health, etc)       Functional Questionnaire: (I = Independent and D = Dependent) ADL's:  Independent.      Follow up appointments reviewed:    PCP Hospital f/u appt confirmed?NP  Bertram Denver on 02/04/32021@ 1050.   Specialist Hospital f/u appt confirmed? None scheduled at this time. Advised to schedule F /u as instructed    Are transportation arrangements needed?No,  have transportation    If their condition worsens, is the pt aware to call  their PCP or go to the ED? Yes.Made pt aware if condition worsen or start experiencing rapid weight gain, chest pain, diff breathing, SOB, high fevers, or bleading to refer imediately to ED for further evaluation.   Was the patient provided with contact information for the PCP's office or ED? He has the phone number   Was the pt encouraged to call back with questions or concerns?yes

## 2020-06-03 ENCOUNTER — Other Ambulatory Visit: Payer: Self-pay | Admitting: Vascular Surgery

## 2020-06-03 ENCOUNTER — Other Ambulatory Visit: Payer: Self-pay

## 2020-06-03 DIAGNOSIS — J4520 Mild intermittent asthma, uncomplicated: Secondary | ICD-10-CM

## 2020-06-04 ENCOUNTER — Telehealth: Payer: Self-pay

## 2020-06-04 NOTE — Telephone Encounter (Signed)
Pt called requesting more pain medication stated he has 1 Percocet left. He has not tried anything OTC and is in agreement to try extra strength Tylenol. He is f/u in office next week. I have advised him to call us back if the OTC medications are not alleviating pain. Pt verbalized understanding.

## 2020-06-09 ENCOUNTER — Encounter: Payer: Self-pay | Admitting: Vascular Surgery

## 2020-06-09 ENCOUNTER — Ambulatory Visit (HOSPITAL_COMMUNITY)
Admission: RE | Admit: 2020-06-09 | Discharge: 2020-06-09 | Disposition: A | Discharge status: Home / Self Care | Payer: Self-pay | Source: Ambulatory Visit | Attending: Nurse Practitioner | Admitting: Nurse Practitioner

## 2020-06-09 ENCOUNTER — Other Ambulatory Visit: Payer: Self-pay | Admitting: *Deleted

## 2020-06-09 ENCOUNTER — Other Ambulatory Visit: Payer: Self-pay

## 2020-06-09 ENCOUNTER — Ambulatory Visit (INDEPENDENT_AMBULATORY_CARE_PROVIDER_SITE_OTHER): Payer: Self-pay | Admitting: Vascular Surgery

## 2020-06-09 VITALS — BP 102/69 | HR 71 | Temp 97.7°F | Resp 20 | Ht 60.0 in | Wt 111.0 lb

## 2020-06-09 DIAGNOSIS — I739 Peripheral vascular disease, unspecified: Secondary | ICD-10-CM

## 2020-06-09 DIAGNOSIS — Z9889 Other specified postprocedural states: Secondary | ICD-10-CM

## 2020-06-09 DIAGNOSIS — Z8679 Personal history of other diseases of the circulatory system: Secondary | ICD-10-CM

## 2020-06-09 MED ORDER — SUCRALFATE 1 GM/10ML PO SUSP: 1.0000 g | Freq: Three times a day (TID) | ORAL | 2 refills | Status: DC

## 2020-06-09 NOTE — Progress Notes (Signed)
ASSESSMENT & PLAN:  Justin Poole is a 55 y.o. male status post open aortic repair with 66mm dacron tube graft for 11mm infrarenal abdominal aortic aneurysm on 05/22/20.  Recommend: Complete cessation from all tobacco products. Excellent blood glucose control with goal A1c < 7%. Blood pressure control with goal blood pressure < 140/90 mmHg. Lipid reduction therapy with goal LDL-C <100 mg/dL. Aspirin 81mg  PO QD.  Atorvastatin 40-80mg  PO QD (or other "high intensity" statin therapy).  Continue protonix and carafate for esophagitis. He has follow up with GI in a month. I will refill his carafate today.   He is scheduled to follow up with Cardiology in the coming months as well.   Popliteal ultrasound today shows no aneurysm.   Follow up with me in 9 months with ABI.  CHIEF COMPLAINT:   Abdominal aortic aneurysm  HISTORY:  HISTORY OF PRESENT ILLNESS: Justin Poole is a 55 y.o. male who initially presented to [facility], ER for evaluation of musculoskeletal left back pain.  He had a documented history of abdominal aortic aneurysm.  CT a in the emergency department revealed a 5.9 cm infrarenal abdominal aortic aneurysm. He reports his back pain is improved. He reports excellent performance status. He denies exertional angina or dyspnea. He can climb a flight of stairs without difficulty.   06/09/20: patient returns status post open aortic repair with 3mm dacron tube graft for 73mm infrarenal abdominal aortic aneurysm on 05/22/20. His immediate postoperative course was complicated by mucous plugging requiring bronchoscopy and upper GI bleeding secondary to severe esophagitis. He otherwise did well and was discharged 05/29/20. He returns to clinic in good spirits today. He reports his energy level is slowly increasing. He has had no problem with breathing or bleeding since his hospitalization. He is slowly increasing his activity.   Past Medical History:  Diagnosis Date  . AAA (abdominal  aortic aneurysm) (HCC)   . Asthma    as a child  . Dysrhythmia    pt states they have told him he "has an irregular heart beat"  . GERD (gastroesophageal reflux disease)   . Hypertension   . Left bundle branch block   . Pancreatitis    hospitalization 06/2018 for acute pancreatitis, likely relatead to alcohol use    Past Surgical History:  Procedure Laterality Date  . AORTA - BILATERAL FEMORAL ARTERY BYPASS GRAFT N/A 05/22/2020   Procedure: OPEN ABDOMINAL AORTIC ANEURYSM REPAIR WITH HEMASHIELD GRAFT;  Surgeon: 05/24/2020, MD;  Location: [facility] OR;  Service: Vascular;  Laterality: N/A;  . ESOPHAGOGASTRODUODENOSCOPY (EGD) WITH PROPOFOL N/A 05/25/2020   Procedure: ESOPHAGOGASTRODUODENOSCOPY (EGD) WITH PROPOFOL;  Surgeon: 05/27/2020, MD;  Location: [facility] [place] ENDOSCOPY;  Service: Gastroenterology;  Laterality: N/A;  . HAND SURGERY Left     Family History  Problem Relation Age of Onset  . CAD Mother        Died of MI 47s  . CAD Father        Died of MI 23s  . Diabetes Mellitus II Neg Hx     Social History   Socioeconomic History  . Marital status: Single    Spouse name: Not on file  . Number of children: Not on file  . Years of education: Not on file  . Highest education level: Not on file  Occupational History  . Not on file  Tobacco Use  . Smoking status: Current Every Day Smoker    Packs/day: 0.50    Years: 45.00  Pack years: 22.50    Types: Cigarettes  . Smokeless tobacco: Never Used  Vaping Use  . Vaping Use: Never used  Substance and Sexual Activity  . Alcohol use: Yes    Comment: 1/2 pint a day   . Drug use: Not Currently  . Sexual activity: Not Currently  Other Topics Concern  . Not on file  Social History Narrative  . Not on file   Social Determinants of Health   Financial Resource Strain: Not on file  Food Insecurity: Not on file  Transportation Needs: Not on file  Physical Activity: Not on file  Stress: Not on file  Social Connections: Not on  file  Intimate Partner Violence: Not on file    No Known Allergies  Current Outpatient Medications  Medication Sig Dispense Refill  . amLODipine (NORVASC) 5 MG tablet Take 1 tablet (5 mg total) by mouth daily. 30 tablet 1  . aspirin EC 81 MG EC tablet Take 1 tablet (81 mg total) by mouth daily at 6 (six) AM. Swallow whole. 30 tablet 11  . Ca Carbonate-Mag Hydroxide (ROLAIDS PO) Take 1 tablet by mouth daily as needed (acid reflux).    Marland Kitchen levalbuterol (XOPENEX) 0.63 MG/3ML nebulizer solution Take 3 mLs (0.63 mg total) by nebulization every 6 (six) hours as needed for wheezing or shortness of breath. 3 mL 12  . metoprolol tartrate (LOPRESSOR) 25 MG tablet Take 1 tablet (25 mg total) by mouth 2 (two) times daily. 60 tablet 2  . montelukast (SINGULAIR) 10 MG tablet Take 1 tablet (10 mg total) by mouth at bedtime.    . nicotine (NICODERM CQ - DOSED IN MG/24 HOURS) 21 mg/24hr patch Place 21 mg onto the skin daily.    Marland Kitchen oxyCODONE-acetaminophen (PERCOCET) 5-325 MG tablet Take 1 tablet by mouth every 6 (six) hours as needed. 20 tablet 0  . pantoprazole (PROTONIX) 40 MG tablet Take 1 tablet (40 mg total) by mouth 2 (two) times daily. 60 tablet 2  . rosuvastatin (CRESTOR) 40 MG tablet Take 1 tablet (40 mg total) by mouth daily. 90 tablet 3  . sucralfate (CARAFATE) 1 GM/10ML suspension Take 10 mLs (1 g total) by mouth 4 (four) times daily -  with meals and at bedtime. 420 mL 0   No current facility-administered medications for this visit.    REVIEW OF SYSTEMS:  [X]  denotes positive finding, [ ]  denotes negative finding Cardiac  Comments:  Chest pain or chest pressure:    Shortness of breath upon exertion:    Short of breath when lying flat:    Irregular heart rhythm:        Vascular    Pain in calf, thigh, or hip brought on by ambulation:    Pain in feet at night that wakes you up from your sleep:     Blood clot in your veins:    Leg swelling:         Pulmonary    Oxygen at home:     Productive cough:     Wheezing:         Neurologic    Sudden weakness in arms or legs:     Sudden numbness in arms or legs:     Sudden onset of difficulty speaking or slurred speech:    Temporary loss of vision in one eye:     Problems with dizziness:         Gastrointestinal    Blood in stool:     Vomited blood:  Genitourinary    Burning when urinating:     Blood in urine:        Psychiatric    Major depression:         Hematologic    Bleeding problems:    Problems with blood clotting too easily:        Skin    Rashes or ulcers:        Constitutional    Fever or chills:     PHYSICAL EXAM:   Vitals:   06/09/20 0817  BP: 102/69  Pulse: 71  Resp: 20  Temp: 97.7 F (36.5 C)  SpO2: 98%  Weight: 111 lb (50.3 kg)  Height: 5' (1.524 m)    Constitutional: well appearing in no distress. Appears well nourished.  Neurologic: normal gait and station. CN intact. no weakness. no sensory loss. Psychiatric: Mood and affect symmetric and appropriate. Eyes: no icterus. no conjunctival pallor. Ears, nose, throat: mucous membranes moist. midline trachea.  Cardiac: regular rate and rhythm.  Respiratory: unlabored. Abdominal: soft, non-tender, non-distended. Midline incision healing well. No evidence of incisional hernia.  Peripheral vascular:  Popliteal pulse: L 2+ / R 2+ Extremity: No edema. No cyanosis. No pallor.  Skin: No gangrene. No ulceration.  Lymphatic: No Stemmer's sign. No palpable lymphadenopathy.  DATA REVIEW:    Most recent CBC CBC Latest Ref Rng & Units 05/30/2020 05/29/2020 05/28/2020  WBC 4.0 - 10.5 K/uL 11.0(H) 14.0(H) 14.6(H)  Hemoglobin 13.0 - 17.0 g/dL 10.1(L) 9.5(L) 9.0(L)  Hematocrit 39.0 - 52.0 % 29.9(L) 29.3(L) 26.5(L)  Platelets 150 - 400 K/uL 488(H) 356 243     Most recent CMP CMP Latest Ref Rng & Units 05/30/2020 05/29/2020 05/28/2020  Glucose 70 - 99 mg/dL 127(N) 170(Y) 174(B)  BUN 6 - 20 mg/dL 9 9 8   Creatinine 0.61 - 1.24 mg/dL  4.49 6.75  Sodium 135 - 145 mmol/L 135 135 137  Potassium 3.5 - 5.1 mmol/L 4.0 3.2(L) 3.6  Chloride 98 - 111 mmol/L 97(L) 98 102  CO2 22 - 32 mmol/L 29 27 26   Calcium 8.9 - 10.3 mg/dL 9.16) ) 8.1(L)  Total Protein 6.5 - 8.1 g/dL - - -  Total Bilirubin 0.3 - 1.2 mg/dL - - -  Alkaline Phos 38 - 126 U/L - - -  AST 15 - 41 U/L - - -  ALT 0 - 44 U/L - - -    Renal function Estimated Creatinine Clearance: 69.4 mL/min (by C-G formula based on SCr of 0.83 mg/dL).  Hgb A1c MFr Bld (%)  Date Value  06/08/2018 5.6    LDL Chol Calc (NIH)  Date Value Ref Range Status  04/09/2020 101 (H) 0 - 99 mg/dL Final     Vascular Imaging: CTA personally reviewed 40mm infrarenal aneurysm with suitable infrarenal neck for clamp site Common iliac arteries appear relatively spared of atherosclerosis and appear suitable for distal clamp sites  Duplex LE 06/09/20 Personally reviewed. No evidence of popliteal artery aneurysm bilaterally  47m. 08/07/20, MD [facility] Phone Number: [phone] 06/09/2020 9:20 AM

## 2020-06-10 ENCOUNTER — Other Ambulatory Visit: Payer: Self-pay

## 2020-06-10 DIAGNOSIS — I739 Peripheral vascular disease, unspecified: Secondary | ICD-10-CM

## 2020-06-12 ENCOUNTER — Encounter: Payer: Self-pay | Admitting: Nurse Practitioner

## 2020-06-12 ENCOUNTER — Other Ambulatory Visit: Payer: Self-pay

## 2020-06-12 ENCOUNTER — Ambulatory Visit: Payer: Self-pay | Attending: Nurse Practitioner | Admitting: Nurse Practitioner

## 2020-06-12 VITALS — Ht 61.0 in | Wt 109.0 lb

## 2020-06-12 DIAGNOSIS — Z7689 Persons encountering health services in other specified circumstances: Secondary | ICD-10-CM

## 2020-06-12 DIAGNOSIS — I10 Essential (primary) hypertension: Secondary | ICD-10-CM

## 2020-06-12 DIAGNOSIS — K219 Gastro-esophageal reflux disease without esophagitis: Secondary | ICD-10-CM

## 2020-06-12 NOTE — Progress Notes (Signed)
Virtual Visit via Telephone Note Due to national recommendations of social distancing due to COVID 19, telehealth visit is felt to be most appropriate for this patient at this time.  I discussed the limitations, risks, security and privacy concerns of performing an evaluation and management service by telephone and the availability of in person appointments. I also discussed with the patient that there may be a patient responsible charge related to this service. The patient expressed understanding and agreed to proceed.    I connected with Justin Poole on 06/12/20  at  10:50 AM EST  EDT by telephone and verified that I am speaking with the correct person using two identifiers.   Consent I discussed the limitations, risks, security and privacy concerns of performing an evaluation and management service by telephone and the availability of in person appointments. I also discussed with the patient that there may be a patient responsible charge related to this service. The patient expressed understanding and agreed to proceed.   Location of Patient: Private Residence     Location of Provider: [facility]    Persons participating in Telemedicine visit: Bertram Denver FNP-BC [facility] [place] CMA Justin Poole    History of Present Illness: Telemedicine visit for: Hospital follow-up evaluation and establish care. Patient has been advised to apply for financial assistance and schedule to see our financial counselor.  PMH: Pancreatitis, HTN, LBBB, Tobacco dependence, Aortic atherosclerosis, AAA 5.9cm s/p open infrarenal aorto-bi-iliac repair 05-22-2020.  Today he reports he is doing well aside from decreased appetite. He states he is not aware of ever taking lopressor or being prescribed this medication. He has not picked it up from the pharmacy and I do not see a reason that he should be taking a BB. He does take amlodipine 5 mg daily for his blood pressure which has been  well controlled. Denies chest pain, shortness of breath, palpitations, lightheadedness, dizziness, headaches or BLE edema.  BP Readings from Last 3 Encounters:  06/09/20 102/69  05/30/20 139/81  05/18/20 125/77   Dyslipidemia Not at goal.. Taking crestor 40 mg daily.  Lab Results  Component Value Date   CHOL 218 (H) 04/09/2020   Lab Results  Component Value Date   HDL 70 04/09/2020   Lab Results  Component Value Date   LDLCALC 101 (H) 04/09/2020   Lab Results  Component Value Date   TRIG 284 (H) 04/09/2020   Lab Results  Component Value Date   CHOLHDL 3.1 04/09/2020    GERD S/P EGD 1-22: LA Grade D reflux esophagitis with no bleeding. This is severe chronic ulcerated reflux esophagitis. He currently denies melena or hematochezia.    Tobacco Use He has been prescribed nicotine patches.     Past Medical History:  Diagnosis Date  . AAA (abdominal aortic aneurysm) (HCC)   . Asthma    as a child  . Dysrhythmia    pt states they have told him he "has an irregular heart beat"  . GERD (gastroesophageal reflux disease)   . Hypertension   . Left bundle branch block   . Pancreatitis    hospitalization 06/2018 for acute pancreatitis, likely relatead to alcohol use    Past Surgical History:  Procedure Laterality Date  . AORTA - BILATERAL FEMORAL ARTERY BYPASS GRAFT N/A 05/22/2020   Procedure: OPEN ABDOMINAL AORTIC ANEURYSM REPAIR WITH HEMASHIELD GRAFT;  Surgeon: Leonie Douglas, MD;  Location: [facility] OR;  Service: Vascular;  Laterality: N/A;  . ESOPHAGOGASTRODUODENOSCOPY (EGD) WITH  PROPOFOL N/A 05/25/2020   Procedure: ESOPHAGOGASTRODUODENOSCOPY (EGD) WITH PROPOFOL;  Surgeon: Sherrilyn Rist, MD;  Location: [facility] ENDOSCOPY;  Service: Gastroenterology;  Laterality: N/A;  . HAND SURGERY Left     Family History  Problem Relation Age of Onset  . CAD Mother        Died of MI 75s  . Heart attack Mother   . CAD Father        Died of MI 42s  . Heart attack Father   . Diabetes  Mellitus II Neg Hx     Social History   Socioeconomic History  . Marital status: Single    Spouse name: Not on file  . Number of children: Not on file  . Years of education: Not on file  . Highest education level: Not on file  Occupational History  . Not on file  Tobacco Use  . Smoking status: Current Every Day Smoker    Packs/day: 0.50    Years: 45.00    Pack years: 22.50    Types: Cigarettes  . Smokeless tobacco: Never Used  Vaping Use  . Vaping Use: Never used  Substance and Sexual Activity  . Alcohol use: Yes    Comment: 1/2 pint a day   . Drug use: Not Currently  . Sexual activity: Not Currently  Other Topics Concern  . Not on file  Social History Narrative  . Not on file   Social Determinants of Health   Financial Resource Strain: Not on file  Food Insecurity: Not on file  Transportation Needs: Not on file  Physical Activity: Not on file  Stress: Not on file  Social Connections: Not on file     Observations/Objective: Awake, alert and oriented x 3   Review of Systems  Constitutional: Negative for fever, malaise/fatigue and weight loss.       Decreased appetite  HENT: Negative.  Negative for nosebleeds.   Eyes: Negative.  Negative for blurred vision, double vision and photophobia.  Respiratory: Negative.  Negative for cough and shortness of breath.   Cardiovascular: Negative.  Negative for chest pain, palpitations and leg swelling.  Gastrointestinal: Negative.  Negative for heartburn, nausea and vomiting.  Musculoskeletal: Negative.  Negative for myalgias.  Neurological: Negative.  Negative for dizziness, focal weakness, seizures and headaches.  Psychiatric/Behavioral: Negative.  Negative for suicidal ideas.    Assessment and Plan: Justin Poole was seen today for hospitalization follow-up.  Diagnoses and all orders for this visit:  Encounter to establish care  Gastroesophageal reflux disease without esophagitis INSTRUCTIONS: Avoid GERD Triggers: acidic,  spicy or fried foods, caffeine, coffee, sodas,  alcohol and chocolate.    Essential hypertension Continue all antihypertensives as prescribed.  Remember to bring in your blood pressure log with you for your follow up appointment.  DASH/Mediterranean Diets are healthier choices for HTN.       Follow Up Instructions Return in about 8 weeks (around 08/07/2020).     I discussed the assessment and treatment plan with the patient. The patient was provided an opportunity to ask questions and all were answered. The patient agreed with the plan and demonstrated an understanding of the instructions.   The patient was advised to call back or seek an in-person evaluation if the symptoms worsen or if the condition fails to improve as anticipated.  I provided 14 minutes of non-face-to-face time during this encounter including median intraservice time, reviewing previous notes, labs, imaging, medications and explaining diagnosis and management.  Claiborne Rigg, FNP-BC

## 2020-06-30 ENCOUNTER — Other Ambulatory Visit: Payer: Self-pay | Admitting: Nurse Practitioner

## 2020-06-30 ENCOUNTER — Ambulatory Visit: Payer: Self-pay | Attending: Nurse Practitioner

## 2020-06-30 ENCOUNTER — Other Ambulatory Visit: Payer: Self-pay

## 2020-06-30 DIAGNOSIS — Z125 Encounter for screening for malignant neoplasm of prostate: Secondary | ICD-10-CM

## 2020-06-30 DIAGNOSIS — D72829 Elevated white blood cell count, unspecified: Secondary | ICD-10-CM

## 2020-06-30 DIAGNOSIS — R7309 Other abnormal glucose: Secondary | ICD-10-CM

## 2020-06-30 DIAGNOSIS — Z1159 Encounter for screening for other viral diseases: Secondary | ICD-10-CM

## 2020-06-30 DIAGNOSIS — Z1211 Encounter for screening for malignant neoplasm of colon: Secondary | ICD-10-CM

## 2020-06-30 DIAGNOSIS — I10 Essential (primary) hypertension: Secondary | ICD-10-CM

## 2020-07-01 LAB — CMP14+EGFR
ALT: 7 IU/L (ref 0–44)
AST: 16 IU/L (ref 0–40)
Albumin/Globulin Ratio: 1.5 (ref 1.2–2.2)
Albumin: 4.8 g/dL (ref 3.8–4.9)
Alkaline Phosphatase: 178 IU/L — ABNORMAL HIGH (ref 44–121)
BUN/Creatinine Ratio: 7 — ABNORMAL LOW (ref 9–20)
BUN: 6 mg/dL (ref 6–24)
Bilirubin Total: <0.2 mg/dL (ref 0.0–1.2)
CO2: 21 mmol/L (ref 20–29)
Calcium: 10 mg/dL (ref 8.7–10.2)
Chloride: 101 mmol/L (ref 96–106)
Creatinine, Ser: 0.82 mg/dL (ref 0.76–1.27)
GFR calc Af Amer: 113 mL/min/{1.73_m2} (ref >59)
GFR calc non Af Amer: 98 mL/min/{1.73_m2} (ref >59)
Globulin, Total: 3.3 g/dL (ref 1.5–4.5)
Glucose: 95 mg/dL (ref 65–99)
Potassium: 4.5 mmol/L (ref 3.5–5.2)
Sodium: 144 mmol/L (ref 134–144)
Total Protein: 8.1 g/dL (ref 6.0–8.5)

## 2020-07-01 LAB — CBC
Hematocrit: 45.7 % (ref 37.5–51.0)
Hemoglobin: 14.5 g/dL (ref 13.0–17.7)
MCH: 28.4 pg (ref 26.6–33.0)
MCHC: 31.7 g/dL (ref 31.5–35.7)
MCV: 89 fL (ref 79–97)
Platelets: 427 10*3/uL (ref 150–450)
RBC: 5.11 x10E6/uL (ref 4.14–5.80)
RDW: 14.4 % (ref 11.6–15.4)
WBC: 6.2 10*3/uL (ref 3.4–10.8)

## 2020-07-01 LAB — HCV AB W REFLEX TO QUANT PCR: HCV Ab: <0.1 s/co ratio (ref 0.0–0.9)

## 2020-07-01 LAB — HEMOGLOBIN A1C
Est. average glucose Bld gHb Est-mCnc: 105 mg/dL
Hgb A1c MFr Bld: 5.3 % (ref 4.8–5.6)

## 2020-07-01 LAB — HCV INTERPRETATION

## 2020-07-01 LAB — PSA: Prostate Specific Ag, Serum: 0.6 ng/mL (ref 0.0–4.0)

## 2020-07-20 ENCOUNTER — Emergency Department (HOSPITAL_COMMUNITY)
Admission: EM | Admit: 2020-07-20 | Discharge: 2020-07-20 | Disposition: A | Discharge status: Home / Self Care | Payer: Self-pay | Attending: Emergency Medicine | Admitting: Emergency Medicine

## 2020-07-20 ENCOUNTER — Other Ambulatory Visit: Payer: Self-pay

## 2020-07-20 ENCOUNTER — Ambulatory Visit: Payer: Self-pay | Admitting: Gastroenterology

## 2020-07-20 ENCOUNTER — Encounter (HOSPITAL_COMMUNITY): Payer: Self-pay

## 2020-07-20 ENCOUNTER — Emergency Department (HOSPITAL_COMMUNITY): Payer: Self-pay

## 2020-07-20 DIAGNOSIS — R110 Nausea: Secondary | ICD-10-CM | POA: Insufficient documentation

## 2020-07-20 DIAGNOSIS — I10 Essential (primary) hypertension: Secondary | ICD-10-CM | POA: Insufficient documentation

## 2020-07-20 DIAGNOSIS — Z7982 Long term (current) use of aspirin: Secondary | ICD-10-CM | POA: Insufficient documentation

## 2020-07-20 DIAGNOSIS — F17210 Nicotine dependence, cigarettes, uncomplicated: Secondary | ICD-10-CM | POA: Insufficient documentation

## 2020-07-20 DIAGNOSIS — R1013 Epigastric pain: Secondary | ICD-10-CM | POA: Insufficient documentation

## 2020-07-20 DIAGNOSIS — Z79899 Other long term (current) drug therapy: Secondary | ICD-10-CM | POA: Insufficient documentation

## 2020-07-20 DIAGNOSIS — R1010 Upper abdominal pain, unspecified: Secondary | ICD-10-CM

## 2020-07-20 DIAGNOSIS — Z20822 Contact with and (suspected) exposure to covid-19: Secondary | ICD-10-CM | POA: Insufficient documentation

## 2020-07-20 DIAGNOSIS — J45909 Unspecified asthma, uncomplicated: Secondary | ICD-10-CM | POA: Insufficient documentation

## 2020-07-20 LAB — RESP PANEL BY RT-PCR (FLU A&B, COVID) ARPGX2
Influenza A by PCR: NEGATIVE
Influenza B by PCR: NEGATIVE
SARS Coronavirus 2 by RT PCR: NEGATIVE

## 2020-07-20 LAB — CBC WITH DIFFERENTIAL/PLATELET
Abs Immature Granulocytes: 0.06 10*3/uL (ref 0.00–0.07)
Basophils Absolute: 0 10*3/uL (ref 0.0–0.1)
Basophils Relative: 0 %
Eosinophils Absolute: 0 10*3/uL (ref 0.0–0.5)
Eosinophils Relative: 0 %
HCT: 43.1 % (ref 39.0–52.0)
Hemoglobin: 14 g/dL (ref 13.0–17.0)
Immature Granulocytes: 0 %
Lymphocytes Relative: 11 %
Lymphs Abs: 1.5 10*3/uL (ref 0.7–4.0)
MCH: 28.7 pg (ref 26.0–34.0)
MCHC: 32.5 g/dL (ref 30.0–36.0)
MCV: 88.5 fL (ref 80.0–100.0)
Monocytes Absolute: 0.7 10*3/uL (ref 0.1–1.0)
Monocytes Relative: 5 %
Neutro Abs: 11.8 10*3/uL — ABNORMAL HIGH (ref 1.7–7.7)
Neutrophils Relative %: 84 %
Platelets: 365 10*3/uL (ref 150–400)
RBC: 4.87 MIL/uL (ref 4.22–5.81)
RDW: 16 % — ABNORMAL HIGH (ref 11.5–15.5)
WBC: 14.2 10*3/uL — ABNORMAL HIGH (ref 4.0–10.5)
nRBC: 0 % (ref 0.0–0.2)

## 2020-07-20 LAB — COMPREHENSIVE METABOLIC PANEL
ALT: 25 U/L (ref 0–44)
AST: 48 U/L — ABNORMAL HIGH (ref 15–41)
Albumin: 4 g/dL (ref 3.5–5.0)
Alkaline Phosphatase: 145 U/L — ABNORMAL HIGH (ref 38–126)
Anion gap: 15 (ref 5–15)
BUN: 10 mg/dL (ref 6–20)
CO2: 21 mmol/L — ABNORMAL LOW (ref 22–32)
Calcium: 8.9 mg/dL (ref 8.9–10.3)
Chloride: 104 mmol/L (ref 98–111)
Creatinine, Ser: 0.9 mg/dL (ref 0.61–1.24)
GFR, Estimated: >60 mL/min (ref >60)
Glucose, Bld: 107 mg/dL — ABNORMAL HIGH (ref 70–99)
Potassium: 3.6 mmol/L (ref 3.5–5.1)
Sodium: 140 mmol/L (ref 135–145)
Total Bilirubin: 0.7 mg/dL (ref 0.3–1.2)
Total Protein: 7.3 g/dL (ref 6.5–8.1)

## 2020-07-20 LAB — LIPASE, BLOOD: Lipase: 27 U/L (ref 11–51)

## 2020-07-20 MED ORDER — SODIUM CHLORIDE 0.9 % IV BOLUS (SEPSIS)
1000.0000 mL | Freq: Once | INTRAVENOUS | Status: AC
  Administered 2020-07-20: 1000 mL via INTRAVENOUS

## 2020-07-20 MED ORDER — HYDROCODONE-ACETAMINOPHEN 5-325 MG PO TABS: 1.0000 | ORAL_TABLET | Freq: Four times a day (QID) | ORAL | 0 refills | Status: DC | PRN

## 2020-07-20 MED ORDER — ONDANSETRON HCL 4 MG/2ML IJ SOLN
4.0000 mg | Freq: Once | INTRAMUSCULAR | Status: AC
  Administered 2020-07-20: 4 mg via INTRAVENOUS
  Filled 2020-07-20: qty 2

## 2020-07-20 MED ORDER — IOHEXOL 300 MG/ML  SOLN
100.0000 mL | Freq: Once | INTRAMUSCULAR | Status: AC | PRN
  Administered 2020-07-20: 80 mL via INTRAVENOUS

## 2020-07-20 MED ORDER — PROMETHAZINE HCL 25 MG PO TABS: 25.0000 mg | ORAL_TABLET | Freq: Four times a day (QID) | ORAL | 0 refills | Status: DC | PRN

## 2020-07-20 MED ORDER — PANTOPRAZOLE SODIUM 40 MG IV SOLR
40.0000 mg | Freq: Once | INTRAVENOUS | Status: AC
  Administered 2020-07-20: 40 mg via INTRAVENOUS
  Filled 2020-07-20: qty 40

## 2020-07-20 MED ORDER — HYDROMORPHONE HCL 1 MG/ML IJ SOLN
0.5000 mg | Freq: Once | INTRAMUSCULAR | Status: AC
Start: 2020-07-20 — End: 2020-07-20
  Administered 2020-07-20: 0.5 mg via INTRAVENOUS
  Filled 2020-07-20: qty 1

## 2020-07-20 NOTE — ED Triage Notes (Addendum)
Abdominal pain n/v x 2 day. Hx of pancreatitis. Denies alcohol for 2 days. 500 mg NS 4 mg zofran and 100 mcg Fentanyl in route.

## 2020-07-20 NOTE — Discharge Instructions (Addendum)
Follow-up with your GI doctors within a week.  Call them today for an appointment

## 2020-07-20 NOTE — ED Provider Notes (Signed)
[facility]-EMERGENCY DEPT Provider Note   CSN: [id number] Arrival date & time: 07/20/20  8850     History Chief Complaint  Patient presents with  . Abdominal Pain  . Nausea    Justin Poole is a 55 y.o. male.  Patient complains of abdominal pain.  Patient has a history of pancreatitis and states he has been drinking again.  Not vomiting any blood  The history is provided by the patient and medical records. No language interpreter was used.  Abdominal Pain Pain location:  Epigastric Pain quality: aching   Pain radiates to:  Does not radiate Pain severity:  Moderate Onset quality:  Sudden Timing:  Constant Progression:  Worsening Chronicity:  New Context: alcohol use   Relieved by:  Nothing Associated symptoms: no chest pain, no cough, no diarrhea, no fatigue and no hematuria        Past Medical History:  Diagnosis Date  . AAA (abdominal aortic aneurysm) (HCC)   . Asthma    as a child  . Dysrhythmia    pt states they have told him he "has an irregular heart beat"  . GERD (gastroesophageal reflux disease)   . Hypertension   . Left bundle branch block   . Pancreatitis    hospitalization 06/2018 for acute pancreatitis, likely relatead to alcohol use    Patient Active Problem List   Diagnosis Date Noted  . Hematemesis with nausea   . Acute blood loss anemia   . Heartburn   . Leukocytosis   . Alcohol use 06/09/2018  . Essential hypertension 06/09/2018  . GERD (gastroesophageal reflux disease) 06/09/2018  . AAA (abdominal aortic aneurysm) (HCC) 06/09/2018  . Acute pancreatitis 06/08/2018  . Hypercalcemia 06/08/2018  . Hypokalemia 06/08/2018    Past Surgical History:  Procedure Laterality Date  . AORTA - BILATERAL FEMORAL ARTERY BYPASS GRAFT N/A 05/22/2020   Procedure: OPEN ABDOMINAL AORTIC ANEURYSM REPAIR WITH HEMASHIELD GRAFT;  Surgeon: Leonie Douglas, MD;  Location: [facility] OR;  Service: Vascular;  Laterality: N/A;  .  ESOPHAGOGASTRODUODENOSCOPY (EGD) WITH PROPOFOL N/A 05/25/2020   Procedure: ESOPHAGOGASTRODUODENOSCOPY (EGD) WITH PROPOFOL;  Surgeon: Sherrilyn Rist, MD;  Location: [facility] ENDOSCOPY;  Service: Gastroenterology;  Laterality: N/A;  . HAND SURGERY Left        Family History  Problem Relation Age of Onset  . CAD Mother        Died of MI 15s  . Heart attack Mother   . CAD Father        Died of MI 96s  . Heart attack Father   . Diabetes Mellitus II Neg Hx     Social History   Tobacco Use  . Smoking status: Current Every Day Smoker    Packs/day: 0.50    Years: 45.00    Pack years: 22.50    Types: Cigarettes  . Smokeless tobacco: Never Used  Vaping Use  . Vaping Use: Never used  Substance Use Topics  . Alcohol use: Yes    Comment: 1/2 pint a day   . Drug use: Not Currently    Home Medications Prior to Admission medications   Medication Sig Start Date End Date Taking? Authorizing Provider  HYDROcodone-acetaminophen (NORCO/VICODIN) 5-325 MG tablet Take 1 tablet by mouth every 6 (six) hours as needed for moderate pain. 07/20/20  Yes Bethann Berkshire, MD  promethazine (PHENERGAN) 25 MG tablet Take 1 tablet (25 mg total) by mouth every 6 (six) hours as needed for nausea or vomiting. 07/20/20  Yes Bethann Berkshire, MD  amLODipine (NORVASC) 5 MG tablet Take 1 tablet (5 mg total) by mouth daily. 04/07/20   Lewayne Bunting, MD  aspirin EC 81 MG EC tablet Take 1 tablet (81 mg total) by mouth daily at 6 (six) AM. Swallow whole. 05/30/20   Emilie Rutter, PA-C  Ca Carbonate-Mag Hydroxide (ROLAIDS PO) Take 1 tablet by mouth daily as needed (acid reflux).    [provider]  levalbuterol Pauline Aus) 0.63 MG/3ML nebulizer solution Take 3 mLs (0.63 mg total) by nebulization every 6 (six) hours as needed for wheezing or shortness of breath. 05/30/20   Emilie Rutter, PA-C  metoprolol tartrate (LOPRESSOR) 25 MG tablet Take 1 tablet (25 mg total) by mouth 2 (two) times daily. Patient not taking:  Reported on 06/12/2020 05/30/20   Emilie Rutter, PA-C  montelukast (SINGULAIR) 10 MG tablet Take 1 tablet (10 mg total) by mouth at bedtime. 05/30/20   Emilie Rutter, PA-C  nicotine (NICODERM CQ - DOSED IN MG/24 HOURS) 21 mg/24hr patch Place 21 mg onto the skin daily.    [provider]  pantoprazole (PROTONIX) 40 MG tablet Take 1 tablet (40 mg total) by mouth 2 (two) times daily. 05/30/20   Emilie Rutter, PA-C  rosuvastatin (CRESTOR) 40 MG tablet Take 1 tablet (40 mg total) by mouth daily. 04/07/20 07/06/20  Lewayne Bunting, MD  sucralfate (CARAFATE) 1 GM/10ML suspension Take 10 mLs (1 g total) by mouth 4 (four) times daily -  with meals and at bedtime. 06/09/20   Leonie Douglas, MD    Allergies    Patient has no known allergies.  Review of Systems   Review of Systems  Constitutional: Negative for appetite change and fatigue.  HENT: Negative for congestion, ear discharge and sinus pressure.   Eyes: Negative for discharge.  Respiratory: Negative for cough.   Cardiovascular: Negative for chest pain.  Gastrointestinal: Positive for abdominal pain. Negative for diarrhea.  Genitourinary: Negative for frequency and hematuria.  Musculoskeletal: Negative for back pain.  Skin: Negative for rash.  Neurological: Negative for seizures and headaches.  Psychiatric/Behavioral: Negative for hallucinations.    Physical Exam Updated Vital Signs BP (!) 152/92   Pulse (!) 112   Temp (!) 97.5 F (36.4 C) (Oral)   Resp 19   SpO2 97%   Physical Exam Vitals reviewed.  Constitutional:      Appearance: He is well-developed.  HENT:     Head: Normocephalic.     Nose: Nose normal.  Eyes:     General: No scleral icterus.    Conjunctiva/sclera: Conjunctivae normal.  Neck:     Thyroid: No thyromegaly.  Cardiovascular:     Rate and Rhythm: Normal rate and regular rhythm.     Heart sounds: No murmur heard. No friction rub. No gallop.   Pulmonary:     Breath sounds: No stridor. No  wheezing or rales.  Chest:     Chest wall: No tenderness.  Abdominal:     General: There is no distension.     Tenderness: There is abdominal tenderness. There is no rebound.  Musculoskeletal:        General: Normal range of motion.     Cervical back: Neck supple.  Lymphadenopathy:     Cervical: No cervical adenopathy.  Skin:    Findings: No erythema or rash.  Neurological:     Mental Status: He is alert and oriented to person, place, and time.     Motor: No abnormal muscle tone.  Coordination: Coordination normal.  Psychiatric:        Behavior: Behavior normal.     ED Results / Procedures / Treatments   Labs (all labs ordered are listed, but only abnormal results are displayed) Labs Reviewed  CBC WITH DIFFERENTIAL/PLATELET - Abnormal; Notable for the following components:      Result Value   WBC 14.2 (*)    RDW 16.0 (*)    Neutro Abs 11.8 (*)    All other components within normal limits  COMPREHENSIVE METABOLIC PANEL - Abnormal; Notable for the following components:   CO2 21 (*)    Glucose, Bld 107 (*)    AST 48 (*)    Alkaline Phosphatase 145 (*)    All other components within normal limits  RESP PANEL BY RT-PCR (FLU A&B, COVID) ARPGX2  LIPASE, BLOOD    EKG None  Radiology CT ABDOMEN PELVIS W CONTRAST  Result Date: 07/20/2020 CLINICAL DATA:  Lower abdominal pain. Nausea and vomiting since this morning. EXAM: CT ABDOMEN AND PELVIS WITH CONTRAST TECHNIQUE: Multidetector CT imaging of the abdomen and pelvis was performed using the standard protocol following bolus administration of intravenous contrast. CONTRAST:  80mL OMNIPAQUE IOHEXOL 300 MG/ML  SOLN COMPARISON:  05/12/2020 FINDINGS: Lower chest: Left hemidiaphragm elevation. Clear lung bases. Normal heart size without pericardial or pleural effusion. Multivessel coronary artery atherosclerosis. Hepatobiliary: Hepatic steatosis. Normal gallbladder, without biliary ductal dilatation. Pancreas: No pancreatic duct  dilatation. Equivocal peripancreatic edema surrounding the head on 34/2. Spleen: Normal in size, without focal abnormality. Adrenals/Urinary Tract: Normal adrenal glands. Too small to characterize upper pole right renal lesion. Normal left kidney, without hydronephrosis. Normal urinary bladder. Stomach/Bowel: Mild motion degradation, especially in the mid abdomen. Normal stomach, without wall thickening. Moderate amount of stool within the rectum. Normal terminal ileum and appendix. Normal small bowel. Vascular/Lymphatic: Status post aortic aneurysm repair. Native sac maximally 4.1 cm today versus 5.5 cm aneurysm on the prior. No surrounding postoperative fluid collection. Aortic atherosclerosis. Both common iliac arteries are prominent but nonaneurysmal including at maximally 1.3 cm on the left. No abdominopelvic adenopathy. Reproductive: Normal prostate. Other: No significant free fluid.  No free intraperitoneal air. Musculoskeletal: Degenerative disc disease at the lumbosacral junction. IMPRESSION: 1. Portions of exam are mildly motion degraded. 2. Equivocal peripancreatic edema involving the head and uncinate process. Today's lipase is normal, arguing against acute pancreatitis. 3. Otherwise, no explanation for acute pain.  Normal appendix. 4. Status post aortic aneurysm repair, without acute complication. 5. Coronary artery atherosclerosis. Aortic Atherosclerosis (ICD10-I70.0). Electronically Signed   By: Jeronimo GreavesKyle  Talbot M.D.   On: 07/20/2020 12:37    Procedures Procedures   Medications Ordered in ED Medications  sodium chloride 0.9 % bolus 1,000 mL (0 mLs Intravenous Stopped 07/20/20 1324)  pantoprazole (PROTONIX) injection 40 mg (40 mg Intravenous Given 07/20/20 1046)  HYDROmorphone (DILAUDID) injection 0.5 mg (0.5 mg Intravenous Given 07/20/20 1046)  ondansetron (ZOFRAN) injection 4 mg (4 mg Intravenous Given 07/20/20 1047)  iohexol (OMNIPAQUE) 300 MG/ML solution 100 mL (80 mLs Intravenous Contrast  Given 07/20/20 1151)    ED Course  I have reviewed the triage vital signs and the nursing notes.  Pertinent labs & imaging results that were available during my care of the patient were reviewed by me and considered in my medical decision making (see chart for details).    MDM Rules/Calculators/A&P  Patient with abdominal pain and history of pancreatitis from alcohol abuse.  CT scan shows some inflammation around the pancreas.  Patient improved with pain medicine nausea medicine he will be sent home with Vicodin and Phenergan and follow-up with GI Final Clinical Impression(s) / ED Diagnoses Final diagnoses:  Pain of upper abdomen    Rx / DC Orders ED Discharge Orders         Ordered    promethazine (PHENERGAN) 25 MG tablet  Every 6 hours PRN        07/20/20 1326    HYDROcodone-acetaminophen (NORCO/VICODIN) 5-325 MG tablet  Every 6 hours PRN        07/20/20 1326           Bethann Berkshire, MD 07/20/20 1333

## 2020-07-21 ENCOUNTER — Telehealth: Payer: Self-pay | Admitting: *Deleted

## 2020-07-21 NOTE — Telephone Encounter (Signed)
Transition Care Management Follow-up Telephone Call  Date of discharge and from where: 07/20/2020 - [facility]  How have you been since you were released from the hospital? "About the same"  Any questions or concerns? No  Items Reviewed:  Did the pt receive and understand the discharge instructions provided? Yes   Medications obtained and verified? Yes   Other? No   Any new allergies since your discharge? No   Dietary orders reviewed? Yes  Do you have support at home? Yes   Home Care and Equipment/Supplies: Were home health services ordered? not applicable If so, what is the name of the agency? N/A  Has the agency set up a time to come to the patient's home? not applicable Were any new equipment or medical supplies ordered?  No What is the name of the medical supply agency? N/A Were you able to get the supplies/equipment? not applicable Do you have any questions related to the use of the equipment or supplies? No  Functional Questionnaire: (I = Independent and D = Dependent) ADLs: I  Bathing/Dressing- I  Meal Prep- I  Eating- I  Maintaining continence- I  Transferring/Ambulation- I  Managing Meds- I  Follow up appointments reviewed:   PCP Hospital f/u appt confirmed? No  - has routine appointment with Dr. Laural Benes on 09/09/2020 at 1110.  Specialist Hospital f/u appt confirmed? No  - has routine appointment with Cardiology on 08/25/2020 at 0900.  Are transportation arrangements needed? No   If their condition worsens, is the pt aware to call PCP or go to the Emergency Dept.? Yes  Was the patient provided with contact information for the PCP's office or ED? Yes  Was to pt encouraged to call back with questions or concerns? Yes

## 2020-07-27 ENCOUNTER — Ambulatory Visit: Payer: Self-pay | Admitting: Cardiology

## 2020-07-31 ENCOUNTER — Other Ambulatory Visit: Payer: Self-pay | Admitting: Cardiology

## 2020-08-13 NOTE — Progress Notes (Deleted)
HPI: Follow-up abdominal aortic aneurysm and cardiomyopathy.  CTA November 2021 showed infrarenal abdominal aortic aneurysm measuring 59 mm; aortic atherosclerosis noted. Nuclear study December 2021 showed ejection fraction 49% and normal perfusion. Echocardiogram December 2021 showed ejection fraction 40-to 45%, grade 1 diastolic dysfunction and trace aortic insufficiency.  Patient had repair of abdominal aortic aneurysm January 2022.  Course was complicated by ischemic colitis treated with antibiotics.  Also had extensive mucous plugs from distal left mainstem.  Since last seen   Current Outpatient Medications  Medication Sig Dispense Refill  . amLODipine (NORVASC) 5 MG tablet Take 1 tablet by mouth once daily 90 tablet 2  . aspirin EC 81 MG EC tablet Take 1 tablet (81 mg total) by mouth daily at 6 (six) AM. Swallow whole. 30 tablet 11  . Ca Carbonate-Mag Hydroxide (ROLAIDS PO) Take 1 tablet by mouth daily as needed (acid reflux).    Marland Kitchen HYDROcodone-acetaminophen (NORCO/VICODIN) 5-325 MG tablet Take 1 tablet by mouth every 6 (six) hours as needed for moderate pain. 20 tablet 0  . levalbuterol (XOPENEX) 0.63 MG/3ML nebulizer solution Take 3 mLs (0.63 mg total) by nebulization every 6 (six) hours as needed for wheezing or shortness of breath. 3 mL 12  . metoprolol tartrate (LOPRESSOR) 25 MG tablet Take 1 tablet (25 mg total) by mouth 2 (two) times daily. (Patient not taking: Reported on 06/12/2020) 60 tablet 2  . montelukast (SINGULAIR) 10 MG tablet Take 1 tablet (10 mg total) by mouth at bedtime.    . nicotine (NICODERM CQ - DOSED IN MG/24 HOURS) 21 mg/24hr patch Place 21 mg onto the skin daily.    . pantoprazole (PROTONIX) 40 MG tablet Take 1 tablet (40 mg total) by mouth 2 (two) times daily. 60 tablet 2  . promethazine (PHENERGAN) 25 MG tablet Take 1 tablet (25 mg total) by mouth every 6 (six) hours as needed for nausea or vomiting. 15 tablet 0  . rosuvastatin (CRESTOR) 40 MG tablet Take 1  tablet (40 mg total) by mouth daily. 90 tablet 3  . sucralfate (CARAFATE) 1 GM/10ML suspension Take 10 mLs (1 g total) by mouth 4 (four) times daily -  with meals and at bedtime. 420 mL 2   No current facility-administered medications for this visit.     Past Medical History:  Diagnosis Date  . AAA (abdominal aortic aneurysm) (HCC)   . Asthma    as a child  . Dysrhythmia    pt states they have told him he "has an irregular heart beat"  . GERD (gastroesophageal reflux disease)   . Hypertension   . Left bundle branch block   . Pancreatitis    hospitalization 06/2018 for acute pancreatitis, likely relatead to alcohol use    Past Surgical History:  Procedure Laterality Date  . AORTA - BILATERAL FEMORAL ARTERY BYPASS GRAFT N/A 05/22/2020   Procedure: OPEN ABDOMINAL AORTIC ANEURYSM REPAIR WITH HEMASHIELD GRAFT;  Surgeon: Leonie Douglas, MD;  Location: [facility] OR;  Service: Vascular;  Laterality: N/A;  . ESOPHAGOGASTRODUODENOSCOPY (EGD) WITH PROPOFOL N/A 05/25/2020   Procedure: ESOPHAGOGASTRODUODENOSCOPY (EGD) WITH PROPOFOL;  Surgeon: Sherrilyn Rist, MD;  Location: [facility] ENDOSCOPY;  Service: Gastroenterology;  Laterality: N/A;  . HAND SURGERY Left     Social History   Socioeconomic History  . Marital status: Single    Spouse name: Not on file  . Number of children: Not on file  . Years of education: Not on file  . Highest education level:  Not on file  Occupational History  . Not on file  Tobacco Use  . Smoking status: Current Every Day Smoker    Packs/day: 0.50    Years: 45.00    Pack years: 22.50    Types: Cigarettes  . Smokeless tobacco: Never Used  Vaping Use  . Vaping Use: Never used  Substance and Sexual Activity  . Alcohol use: Yes    Comment: 1/2 pint a day   . Drug use: Not Currently  . Sexual activity: Not Currently  Other Topics Concern  . Not on file  Social History Narrative  . Not on file   Social Determinants of Health   Financial Resource Strain: Not on  file  Food Insecurity: Not on file  Transportation Needs: Not on file  Physical Activity: Not on file  Stress: Not on file  Social Connections: Not on file  Intimate Partner Violence: Not on file    Family History  Problem Relation Age of Onset  . CAD Mother        Died of MI 59s  . Heart attack Mother   . CAD Father        Died of MI 3s  . Heart attack Father   . Diabetes Mellitus II Neg Hx     ROS: no fevers or chills, productive cough, hemoptysis, dysphasia, odynophagia, melena, hematochezia, dysuria, hematuria, rash, seizure activity, orthopnea, PND, pedal edema, claudication. Remaining systems are negative.  Physical Exam: Well-developed well-nourished in no acute distress.  Skin is warm and dry.  HEENT is normal.  Neck is supple.  Chest is clear to auscultation with normal expansion.  Cardiovascular exam is regular rate and rhythm.  Abdominal exam nontender or distended. No masses palpated. Extremities show no edema. neuro grossly intact  ECG- personally reviewed  A/P  1 mild cardiomyopathy-  2 status post abdominal aortic aneurysm repair-follow-up vascular surgery.  3 hypertension-patient's blood pressure is controlled.  Continue present medications and follow.  4 hyperlipidemia-continue statin.  Check lipids and liver.  5 tobacco abuse-patient counseled on discontinuing.  Olga Millers, MD

## 2020-08-24 ENCOUNTER — Encounter (HOSPITAL_COMMUNITY): Payer: Self-pay | Admitting: Emergency Medicine

## 2020-08-24 ENCOUNTER — Emergency Department (HOSPITAL_COMMUNITY)
Admission: EM | Admit: 2020-08-24 | Discharge: 2020-08-24 | Disposition: A | Discharge status: Home / Self Care | Payer: Self-pay | Attending: Emergency Medicine | Admitting: Emergency Medicine

## 2020-08-24 ENCOUNTER — Other Ambulatory Visit: Payer: Self-pay

## 2020-08-24 ENCOUNTER — Emergency Department (HOSPITAL_COMMUNITY): Payer: Self-pay

## 2020-08-24 DIAGNOSIS — R079 Chest pain, unspecified: Secondary | ICD-10-CM

## 2020-08-24 DIAGNOSIS — F17210 Nicotine dependence, cigarettes, uncomplicated: Secondary | ICD-10-CM | POA: Insufficient documentation

## 2020-08-24 DIAGNOSIS — Z28310 Unvaccinated for covid-19: Secondary | ICD-10-CM | POA: Insufficient documentation

## 2020-08-24 DIAGNOSIS — J45909 Unspecified asthma, uncomplicated: Secondary | ICD-10-CM | POA: Insufficient documentation

## 2020-08-24 DIAGNOSIS — Z7982 Long term (current) use of aspirin: Secondary | ICD-10-CM | POA: Insufficient documentation

## 2020-08-24 DIAGNOSIS — K2920 Alcoholic gastritis without bleeding: Secondary | ICD-10-CM

## 2020-08-24 DIAGNOSIS — I10 Essential (primary) hypertension: Secondary | ICD-10-CM | POA: Insufficient documentation

## 2020-08-24 DIAGNOSIS — R000 Tachycardia, unspecified: Secondary | ICD-10-CM | POA: Insufficient documentation

## 2020-08-24 DIAGNOSIS — Z79899 Other long term (current) drug therapy: Secondary | ICD-10-CM | POA: Insufficient documentation

## 2020-08-24 LAB — COMPREHENSIVE METABOLIC PANEL
ALT: 24 U/L (ref 0–44)
AST: 40 U/L (ref 15–41)
Albumin: 4.2 g/dL (ref 3.5–5.0)
Alkaline Phosphatase: 154 U/L — ABNORMAL HIGH (ref 38–126)
Anion gap: 15 (ref 5–15)
BUN: 8 mg/dL (ref 6–20)
CO2: 27 mmol/L (ref 22–32)
Calcium: 9.6 mg/dL (ref 8.9–10.3)
Chloride: 94 mmol/L — ABNORMAL LOW (ref 98–111)
Creatinine, Ser: 1.07 mg/dL (ref 0.61–1.24)
GFR, Estimated: >60 mL/min (ref >60)
Glucose, Bld: 118 mg/dL — ABNORMAL HIGH (ref 70–99)
Potassium: 3.6 mmol/L (ref 3.5–5.1)
Sodium: 136 mmol/L (ref 135–145)
Total Bilirubin: 0.8 mg/dL (ref 0.3–1.2)
Total Protein: 7.4 g/dL (ref 6.5–8.1)

## 2020-08-24 LAB — LIPASE, BLOOD: Lipase: 33 U/L (ref 11–51)

## 2020-08-24 LAB — CBC WITH DIFFERENTIAL/PLATELET
Abs Immature Granulocytes: 0.05 10*3/uL (ref 0.00–0.07)
Basophils Absolute: 0.1 10*3/uL (ref 0.0–0.1)
Basophils Relative: 1 %
Eosinophils Absolute: 0.1 10*3/uL (ref 0.0–0.5)
Eosinophils Relative: 0 %
HCT: 45 % (ref 39.0–52.0)
Hemoglobin: 15.1 g/dL (ref 13.0–17.0)
Immature Granulocytes: 0 %
Lymphocytes Relative: 14 %
Lymphs Abs: 1.9 10*3/uL (ref 0.7–4.0)
MCH: 28.7 pg (ref 26.0–34.0)
MCHC: 33.6 g/dL (ref 30.0–36.0)
MCV: 85.4 fL (ref 80.0–100.0)
Monocytes Absolute: 0.8 10*3/uL (ref 0.1–1.0)
Monocytes Relative: 6 %
Neutro Abs: 10.7 10*3/uL — ABNORMAL HIGH (ref 1.7–7.7)
Neutrophils Relative %: 79 %
Platelets: 258 10*3/uL (ref 150–400)
RBC: 5.27 MIL/uL (ref 4.22–5.81)
RDW: 16.8 % — ABNORMAL HIGH (ref 11.5–15.5)
WBC: 13.6 10*3/uL — ABNORMAL HIGH (ref 4.0–10.5)
nRBC: 0 % (ref 0.0–0.2)

## 2020-08-24 LAB — TROPONIN I (HIGH SENSITIVITY)
Troponin I (High Sensitivity): 17 ng/L (ref <18)
Troponin I (High Sensitivity): 17 ng/L (ref <18)

## 2020-08-24 MED ORDER — MORPHINE SULFATE (PF) 4 MG/ML IV SOLN
4.0000 mg | Freq: Once | INTRAVENOUS | Status: AC
Start: 2020-08-24 — End: 2020-08-24
  Administered 2020-08-24: 4 mg via INTRAVENOUS
  Filled 2020-08-24: qty 1

## 2020-08-24 MED ORDER — LORAZEPAM 2 MG/ML IJ SOLN
1.0000 mg | Freq: Once | INTRAMUSCULAR | Status: AC
  Administered 2020-08-24: 1 mg via INTRAVENOUS
  Filled 2020-08-24: qty 1

## 2020-08-24 MED ORDER — IOHEXOL 350 MG/ML SOLN
100.0000 mL | Freq: Once | INTRAVENOUS | Status: AC | PRN
  Administered 2020-08-24: 100 mL via INTRAVENOUS

## 2020-08-24 MED ORDER — LACTATED RINGERS IV BOLUS
1000.0000 mL | Freq: Once | INTRAVENOUS | Status: AC
  Administered 2020-08-24: 1000 mL via INTRAVENOUS

## 2020-08-24 MED ORDER — ALUM & MAG HYDROXIDE-SIMETH 200-200-20 MG/5ML PO SUSP
15.0000 mL | Freq: Once | ORAL | Status: AC
  Administered 2020-08-24: 15 mL via ORAL
  Filled 2020-08-24: qty 30

## 2020-08-24 MED ORDER — CHLORDIAZEPOXIDE HCL 25 MG PO CAPS
25.0000 mg | ORAL_CAPSULE | Freq: Once | ORAL | Status: AC
  Administered 2020-08-24: 25 mg via ORAL
  Filled 2020-08-24: qty 1

## 2020-08-24 MED ORDER — SUCRALFATE 1 G PO TABS: 1.0000 g | ORAL_TABLET | Freq: Three times a day (TID) | ORAL | 1 refills | Status: DC

## 2020-08-24 MED ORDER — ONDANSETRON 4 MG PO TBDP: 4.0000 mg | ORAL_TABLET | Freq: Three times a day (TID) | ORAL | 0 refills | Status: DC | PRN

## 2020-08-24 MED ORDER — CHLORDIAZEPOXIDE HCL 25 MG PO CAPS: ORAL_CAPSULE | ORAL | 0 refills | Status: DC

## 2020-08-24 MED ORDER — PANTOPRAZOLE SODIUM 40 MG IV SOLR
40.0000 mg | Freq: Once | INTRAVENOUS | Status: AC
  Administered 2020-08-24: 40 mg via INTRAVENOUS
  Filled 2020-08-24: qty 40

## 2020-08-24 MED ORDER — ONDANSETRON HCL 4 MG/2ML IJ SOLN
4.0000 mg | Freq: Once | INTRAMUSCULAR | Status: AC
  Administered 2020-08-24: 4 mg via INTRAVENOUS
  Filled 2020-08-24: qty 2

## 2020-08-24 NOTE — ED Provider Notes (Signed)
[facility] EMERGENCY DEPARTMENT Provider Note   CSN: [id number] Arrival date & time: 08/24/20  1349     History Chief Complaint  Patient presents with  . Chest Pain    Justin Poole is a 55 y.o. male.  The history is provided by the patient and medical records.  Chest Pain  Justin Poole is a 55 y.o. male who presents to the Emergency Department complaining of chest pain.  He presents to the ED complaining of central chest pain that started with waking this morning.  Pain is constant and non radiating.  Has associated vomiting, unable to count - nonbloody.  Has associated central abdominal pain, started around the same time.  Feels like prior episodes of pancreatitis.  Feels dehydrate  Denies cough, fever, diarrhea, dysuria, constipation.    Has a hx/o HTN, HPL has not taken meds today.  No known sick contacts.  Has not been vaccinated for covid 19.  Smokes tobacco.  Drinks alcohol, about a pint daily - last drink two days ago.      Past Medical History:  Diagnosis Date  . AAA (abdominal aortic aneurysm) (HCC)   . Asthma    as a child  . Dysrhythmia    pt states they have told him he "has an irregular heart beat"  . GERD (gastroesophageal reflux disease)   . Hypertension   . Left bundle branch block   . Pancreatitis    hospitalization 06/2018 for acute pancreatitis, likely relatead to alcohol use    Patient Active Problem List   Diagnosis Date Noted  . Hematemesis with nausea   . Acute blood loss anemia   . Heartburn   . Leukocytosis   . Alcohol use 06/09/2018  . Essential hypertension 06/09/2018  . GERD (gastroesophageal reflux disease) 06/09/2018  . AAA (abdominal aortic aneurysm) (HCC) 06/09/2018  . Acute pancreatitis 06/08/2018  . Hypercalcemia 06/08/2018  . Hypokalemia 06/08/2018    Past Surgical History:  Procedure Laterality Date  . AORTA - BILATERAL FEMORAL ARTERY BYPASS GRAFT N/A 05/22/2020   Procedure: OPEN ABDOMINAL AORTIC  ANEURYSM REPAIR WITH HEMASHIELD GRAFT;  Surgeon: Leonie Douglas, MD;  Location: [facility] OR;  Service: Vascular;  Laterality: N/A;  . ESOPHAGOGASTRODUODENOSCOPY (EGD) WITH PROPOFOL N/A 05/25/2020   Procedure: ESOPHAGOGASTRODUODENOSCOPY (EGD) WITH PROPOFOL;  Surgeon: Sherrilyn Rist, MD;  Location[facility] ENDOSCOPY;  Service: Gastroenterology;  Laterality: N/A;  . HAND SURGERY Left        Family History  Problem Relation Age of Onset  . CAD Mother        Died of MI 22s  . Heart attack Mother   . CAD Father        Died of MI 57s  . Heart attack Father   . Diabetes Mellitus II Neg Hx     Social History   Tobacco Use  . Smoking status: Current Every Day Smoker    Packs/day: 0.50    Years: 45.00    Pack years: 22.50    Types: Cigarettes  . Smokeless tobacco: Never Used  Vaping Use  . Vaping Use: Never used  Substance Use Topics  . Alcohol use: Yes    Comment: 1/2 pint a day   . Drug use: Not Currently    Home Medications Prior to Admission medications   Medication Sig Start Date End Date Taking? Authorizing Provider  chlordiazePOXIDE (LIBRIUM) 25 MG capsule 50mg  PO TID x 1D, then 25-50mg  PO BID X 1D, then 25-50mg  PO  QD X 1D 08/24/20  Yes Tilden Fossa, MD  ondansetron (ZOFRAN ODT) 4 MG disintegrating tablet Take 1 tablet (4 mg total) by mouth every 8 (eight) hours as needed for nausea or vomiting. 08/24/20  Yes Tilden Fossa, MD  sucralfate (CARAFATE) 1 g tablet Take 1 tablet (1 g total) by mouth 4 (four) times daily -  with meals and at bedtime. 08/24/20  Yes Tilden Fossa, MD  amLODipine (NORVASC) 5 MG tablet Take 1 tablet by mouth once daily 07/31/20   Lewayne Bunting, MD  aspirin EC 81 MG EC tablet Take 1 tablet (81 mg total) by mouth daily at 6 (six) AM. Swallow whole. 05/30/20   Emilie Rutter, PA-C  Ca Carbonate-Mag Hydroxide (ROLAIDS PO) Take 1 tablet by mouth daily as needed (acid reflux).    [provider]  HYDROcodone-acetaminophen (NORCO/VICODIN) 5-325 MG  tablet Take 1 tablet by mouth every 6 (six) hours as needed for moderate pain. 07/20/20   Bethann Berkshire, MD  levalbuterol Pauline Aus) 0.63 MG/3ML nebulizer solution Take 3 mLs (0.63 mg total) by nebulization every 6 (six) hours as needed for wheezing or shortness of breath. 05/30/20   Emilie Rutter, PA-C  metoprolol tartrate (LOPRESSOR) 25 MG tablet Take 1 tablet (25 mg total) by mouth 2 (two) times daily. Patient not taking: Reported on 06/12/2020 05/30/20   Emilie Rutter, PA-C  montelukast (SINGULAIR) 10 MG tablet Take 1 tablet (10 mg total) by mouth at bedtime. 05/30/20   Emilie Rutter, PA-C  nicotine (NICODERM CQ - DOSED IN MG/24 HOURS) 21 mg/24hr patch Place 21 mg onto the skin daily.    [provider]  pantoprazole (PROTONIX) 40 MG tablet Take 1 tablet (40 mg total) by mouth 2 (two) times daily. 05/30/20   Emilie Rutter, PA-C  promethazine (PHENERGAN) 25 MG tablet Take 1 tablet (25 mg total) by mouth every 6 (six) hours as needed for nausea or vomiting. 07/20/20   Bethann Berkshire, MD  rosuvastatin (CRESTOR) 40 MG tablet Take 1 tablet (40 mg total) by mouth daily. 04/07/20 07/06/20  Lewayne Bunting, MD    Allergies    Patient has no known allergies.  Review of Systems   Review of Systems  Cardiovascular: Positive for chest pain.  All other systems reviewed and are negative.   Physical Exam Updated Vital Signs BP (!) 168/105   Pulse (!) 107   Temp 98 F (36.7 C) (Oral)   Resp 19   SpO2 96%   Physical Exam Vitals and nursing note reviewed.  Constitutional:      Appearance: He is well-developed.  HENT:     Head: Normocephalic and atraumatic.  Cardiovascular:     Rate and Rhythm: Regular rhythm. Tachycardia present.     Heart sounds: No murmur heard.   Pulmonary:     Effort: Pulmonary effort is normal. No respiratory distress.     Breath sounds: Normal breath sounds.  Abdominal:     Palpations: Abdomen is soft.     Tenderness: There is no guarding or  rebound.     Comments: Moderate upper abdominal tenderness  Musculoskeletal:        General: No swelling or tenderness.  Skin:    General: Skin is warm and dry.  Neurological:     Mental Status: He is alert and oriented to person, place, and time.  Psychiatric:        Behavior: Behavior normal.     ED Results / Procedures / Treatments   Labs (all labs ordered  are listed, but only abnormal results are displayed) Labs Reviewed  COMPREHENSIVE METABOLIC PANEL - Abnormal; Notable for the following components:      Result Value   Chloride 94 (*)    Glucose, Bld 118 (*)    Alkaline Phosphatase 154 (*)    All other components within normal limits  CBC WITH DIFFERENTIAL/PLATELET - Abnormal; Notable for the following components:   WBC 13.6 (*)    RDW 16.8 (*)    Neutro Abs 10.7 (*)    All other components within normal limits  LIPASE, BLOOD  TROPONIN I (HIGH SENSITIVITY)  TROPONIN I (HIGH SENSITIVITY)    EKG None  Radiology DG Chest 2 View  Result Date: 08/24/2020 CLINICAL DATA:  Chest pain and shortness of breath since this morning. EXAM: CHEST - 2 VIEW COMPARISON:  Chest x-ray 05/26/2020 FINDINGS: The cardiac silhouette, mediastinal and hilar contours are within normal limits. The lungs are clear. No pleural effusion. No pulmonary lesions. No pneumothorax. The bony thorax is intact. IMPRESSION: No acute cardiopulmonary findings. Electronically Signed   By: Rudie Meyer M.D.   On: 08/24/2020 14:16   CT Angio Chest/Abd/Pel for Dissection W and/or W/WO  Result Date: 08/24/2020 CLINICAL DATA:  Chest and abdominal pain EXAM: CT ANGIOGRAPHY CHEST, ABDOMEN AND PELVIS TECHNIQUE: Non-contrast CT of the chest was initially obtained. Multidetector CT imaging through the chest, abdomen and pelvis was performed using the standard protocol during bolus administration of intravenous contrast. Multiplanar reconstructed images and MIPs were obtained and reviewed to evaluate the vascular anatomy.  CONTRAST:  OMNIPAQUE IOHEXOL 350 MG/ML SOLN COMPARISON:  Chest x-ray from earlier in the same day, CT from 07/20/2020 and 05/12/2020. FINDINGS: CTA CHEST FINDINGS Cardiovascular: Initial precontrast images demonstrate no hyperdense crescent to suggest acute aortic injury. Post-contrast images demonstrate atherosclerotic calcifications of the thoracic aorta. Coronary calcifications are seen. No cardiac enlargement is noted. Pulmonary artery as visualized is within normal limits. No findings to suggest pulmonary embolism are seen. Mediastinum/Nodes: Thoracic inlet is within normal limits. No sizable hilar or mediastinal adenopathy is noted. The esophagus as visualized is within normal limits. Lungs/Pleura: The lungs are well aerated bilaterally. No focal infiltrate or sizable effusion is seen. Musculoskeletal: No chest wall abnormality. No acute or significant osseous findings. Review of the MIP images confirms the above findings. CTA ABDOMEN AND PELVIS FINDINGS VASCULAR Aorta: Atherosclerotic calcifications are noted. There are changes consistent with prior aortic tube graft. The native sac measures approximately 3.9 cm in transverse dimension decreased in appearance when compared with the prior exam at which time it measured 4.1 cm. The bypass graft lies within the native aneurysm sac. Aortic bifurcation is patent. Celiac: Patent without evidence of aneurysm, dissection, vasculitis or significant stenosis. SMA: Patent without evidence of aneurysm, dissection, vasculitis or significant stenosis. Mild atherosclerotic calcifications are noted at the origin of the SMA. 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 IMA: Origin of the IMA has been excluded although collateral flow is noted from the SMA. Iliacs: Iliacs demonstrate atherosclerotic calcification with mild ectasia of the left common iliac artery similar to that seen on prior CT examination. More distal iliac  arteries are within normal limits. Veins: No specific venous abnormality is seen. Review of the MIP images confirms the above findings. NON-VASCULAR Hepatobiliary: No focal liver abnormality is seen. No gallstones, gallbladder wall thickening, or biliary dilatation. Pancreas: Unremarkable. No pancreatic ductal dilatation or surrounding inflammatory changes. Spleen: Normal in size without focal  abnormality. Adrenals/Urinary Tract: Adrenal glands are within normal limits. Kidneys demonstrate a normal enhancement pattern bilaterally. No renal calculi or obstructive changes are seen. Ureters are within normal limits. Bladder is decompressed. Stomach/Bowel: Mild diverticular change of the colon is noted without evidence of diverticulitis. No obstructive or inflammatory changes of the colon are seen. The appendix is within normal limits. Small bowel and stomach appear unremarkable. Lymphatic: No significant lymphadenopathy is noted. Reproductive: Prostate is unremarkable. Other: No abdominal wall hernia or abnormality. No abdominopelvic ascites. Musculoskeletal: No acute or significant osseous findings. Review of the MIP images confirms the above findings. IMPRESSION: Changes consistent with infrarenal aortic tube graft with slight reduction in aneurysm sac when compared with prior exam. No evidence of acute aneurysmal dilatation or dissection is seen. Diverticulosis without diverticulitis. No evidence of pulmonary emboli. Electronically Signed   By: Alcide CleverMark  Lukens M.D.   On: 08/24/2020 17:30    Procedures Procedures   Medications Ordered in ED Medications  lactated ringers bolus 1,000 mL (0 mLs Intravenous Stopped 08/24/20 1723)  morphine 4 MG/ML injection 4 mg (4 mg Intravenous Given 08/24/20 1606)  ondansetron (ZOFRAN) injection 4 mg (4 mg Intravenous Given 08/24/20 1603)  LORazepam (ATIVAN) injection 1 mg (1 mg Intravenous Given 08/24/20 1603)  pantoprazole (PROTONIX) injection 40 mg (40 mg Intravenous Given  08/24/20 1602)  iohexol (OMNIPAQUE) 350 MG/ML injection 100 mL (100 mLs Intravenous Contrast Given 08/24/20 1711)  alum & mag hydroxide-simeth (MAALOX/MYLANTA) 200-200-20 MG/5ML suspension 15 mL (15 mLs Oral Given 08/24/20 1812)  chlordiazePOXIDE (LIBRIUM) capsule 25 mg (25 mg Oral Given 08/24/20 1812)    ED Course  I have reviewed the triage vital signs and the nursing notes.  Pertinent labs & imaging results that were available during my care of the patient were reviewed by me and considered in my medical decision making (see chart for details).    MDM Rules/Calculators/A&P                         Patient here for evaluation of chest pain, abdominal pain and vomiting. He does have a history of alcohol abuse and has not had any alcohol for two days. He does not want to quit drinking but does want to cut back on his alcohol use. No evidence of dissection, PE. Presentation is not consistent with ACS. No evidence of pancreatitis. After IV fluids and medications he is feeling improved. He does have mild persistent tachycardia but he did Mrs. metoprolol. He is able to tolerate oral fluids without difficulty. Discussed with patient homecare for likely gastritis. Will provide Librium if patient decides to discontinue his alcohol use. Discussed outpatient follow-up and return precautions.  Final Clinical Impression(s) / ED Diagnoses Final diagnoses:  Acute alcoholic gastritis without hemorrhage    Rx / DC Orders ED Discharge Orders         Ordered    sucralfate (CARAFATE) 1 g tablet  3 times daily with meals & bedtime        08/24/20 1748    ondansetron (ZOFRAN ODT) 4 MG disintegrating tablet  Every 8 hours PRN        08/24/20 1748    chlordiazePOXIDE (LIBRIUM) 25 MG capsule        08/24/20 1847           Tilden Fossaees, Zanyah Lentsch, MD 08/24/20 1906

## 2020-08-24 NOTE — Care Management (Signed)
ED RNCM arranged [facility] transportation per ED nurse reequest. Patient does not have anyone to pick him up. Patient waiver signed to be scanned into record.

## 2020-08-24 NOTE — ED Triage Notes (Signed)
Emergency Medicine Provider Triage Evaluation Note  Justin Poole , a 55 y.o. male  was evaluated in triage.  Pt complains of chest pain that started at 9am. Reports associated sob, nausea, vomiting. He also c/o abd pain and palpitations. Did not take home metop due to feeling unwell. 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 Last drink 2 days ago.   Review of Systems  Positive: abd pain, chest pain, nv, sob Negative: fever  Physical Exam  BP (!) 169/114 (BP Location: Right Arm)   Pulse (!) 147   Temp 97.8 F (36.6 C) (Oral)   Resp (!) 22   SpO2 94%  Gen:   Awake, no distress   HEENT:  Atraumatic  Resp:  Normal effort  Cardiac:  tachycardic Abd:   Nondistended, rue, epigatric ttp MSK:   Moves extremities without difficulty  Neuro:  Speech clear   Medical Decision Making  Medically screening exam initiated at 1:59 PM.  Appropriate orders placed.  KNOXX BOEDING was informed that the remainder of the evaluation will be completed by another provider, this initial triage assessment does not replace that evaluation, and the importance of remaining in the ED until their evaluation is complete.  Clinical Impression   55 y/o M presenting for eval of cp, abd pain, nvd  Pt prioritized to be taken to next room  MSE was initiated and I personally evaluated the patient and placed orders (if any) at  1:59 PM on August 24, 2020.  The patient appears stable so that the remainder of the MSE may be completed by another provider.    Karrie Meres, PA-C 08/24/20 1359

## 2020-08-24 NOTE — ED Triage Notes (Signed)
Pt to triage via [facility] from home.  Reports chest pain and abd pain since waking up at 9am.  States it felt like a fist on his chest. Non-radiating.  Reports nausea and vomiting.  Hx of pancreatitis.  No ETOH x 2 days.  ASA 324mg  given by EMS  Pain 8/10 to 4/10 after ASA.  18g LAC.

## 2020-08-25 ENCOUNTER — Ambulatory Visit: Payer: Self-pay | Admitting: Cardiology

## 2020-09-09 ENCOUNTER — Ambulatory Visit: Payer: Self-pay | Admitting: Nurse Practitioner

## 2020-10-28 ENCOUNTER — Other Ambulatory Visit: Payer: Self-pay | Admitting: Physician Assistant

## 2020-10-28 NOTE — Telephone Encounter (Signed)
Refill request needs to go to PCP - Bertram [place]

## 2020-11-14 ENCOUNTER — Emergency Department (HOSPITAL_COMMUNITY): Payer: Medicaid Other

## 2020-11-14 ENCOUNTER — Inpatient Hospital Stay (HOSPITAL_COMMUNITY)
Admission: EM | Admit: 2020-11-14 | Discharge: 2020-11-16 | DRG: 377 | Disposition: A | Discharge status: Home / Self Care | Payer: Medicaid Other | Attending: Family Medicine | Admitting: Family Medicine

## 2020-11-14 ENCOUNTER — Other Ambulatory Visit: Payer: Self-pay

## 2020-11-14 ENCOUNTER — Encounter (HOSPITAL_COMMUNITY): Payer: Self-pay

## 2020-11-14 DIAGNOSIS — F17210 Nicotine dependence, cigarettes, uncomplicated: Secondary | ICD-10-CM | POA: Diagnosis present

## 2020-11-14 DIAGNOSIS — K2970 Gastritis, unspecified, without bleeding: Secondary | ICD-10-CM | POA: Diagnosis present

## 2020-11-14 DIAGNOSIS — Z8719 Personal history of other diseases of the digestive system: Secondary | ICD-10-CM | POA: Diagnosis not present

## 2020-11-14 DIAGNOSIS — Z8679 Personal history of other diseases of the circulatory system: Secondary | ICD-10-CM | POA: Diagnosis not present

## 2020-11-14 DIAGNOSIS — Z20822 Contact with and (suspected) exposure to covid-19: Secondary | ICD-10-CM | POA: Diagnosis present

## 2020-11-14 DIAGNOSIS — Z8249 Family history of ischemic heart disease and other diseases of the circulatory system: Secondary | ICD-10-CM | POA: Diagnosis not present

## 2020-11-14 DIAGNOSIS — Z95820 Peripheral vascular angioplasty status with implants and grafts: Secondary | ICD-10-CM | POA: Diagnosis not present

## 2020-11-14 DIAGNOSIS — K208 Other esophagitis without bleeding: Secondary | ICD-10-CM | POA: Diagnosis present

## 2020-11-14 DIAGNOSIS — I723 Aneurysm of iliac artery: Secondary | ICD-10-CM | POA: Diagnosis present

## 2020-11-14 DIAGNOSIS — Z79899 Other long term (current) drug therapy: Secondary | ICD-10-CM | POA: Diagnosis not present

## 2020-11-14 DIAGNOSIS — F1010 Alcohol abuse, uncomplicated: Secondary | ICD-10-CM | POA: Diagnosis present

## 2020-11-14 DIAGNOSIS — I10 Essential (primary) hypertension: Secondary | ICD-10-CM | POA: Diagnosis present

## 2020-11-14 DIAGNOSIS — E876 Hypokalemia: Secondary | ICD-10-CM | POA: Diagnosis present

## 2020-11-14 DIAGNOSIS — Z7982 Long term (current) use of aspirin: Secondary | ICD-10-CM | POA: Diagnosis not present

## 2020-11-14 DIAGNOSIS — I7772 Dissection of iliac artery: Secondary | ICD-10-CM | POA: Diagnosis present

## 2020-11-14 DIAGNOSIS — E860 Dehydration: Secondary | ICD-10-CM | POA: Diagnosis present

## 2020-11-14 DIAGNOSIS — K920 Hematemesis: Secondary | ICD-10-CM | POA: Diagnosis present

## 2020-11-14 DIAGNOSIS — D72828 Other elevated white blood cell count: Secondary | ICD-10-CM | POA: Diagnosis present

## 2020-11-14 DIAGNOSIS — E785 Hyperlipidemia, unspecified: Secondary | ICD-10-CM | POA: Diagnosis present

## 2020-11-14 DIAGNOSIS — J45909 Unspecified asthma, uncomplicated: Secondary | ICD-10-CM | POA: Diagnosis present

## 2020-11-14 DIAGNOSIS — R1013 Epigastric pain: Secondary | ICD-10-CM

## 2020-11-14 DIAGNOSIS — K922 Gastrointestinal hemorrhage, unspecified: Secondary | ICD-10-CM

## 2020-11-14 DIAGNOSIS — K219 Gastro-esophageal reflux disease without esophagitis: Secondary | ICD-10-CM | POA: Diagnosis present

## 2020-11-14 LAB — CBC WITH DIFFERENTIAL/PLATELET
Abs Immature Granulocytes: 0.23 10*3/uL — ABNORMAL HIGH (ref 0.00–0.07)
Basophils Absolute: 0 10*3/uL (ref 0.0–0.1)
Basophils Relative: 0 %
Eosinophils Absolute: 0 10*3/uL (ref 0.0–0.5)
Eosinophils Relative: 0 %
HCT: 39.4 % (ref 39.0–52.0)
Hemoglobin: 13 g/dL (ref 13.0–17.0)
Immature Granulocytes: 1 %
Lymphocytes Relative: 3 %
Lymphs Abs: 0.9 10*3/uL (ref 0.7–4.0)
MCH: 29.3 pg (ref 26.0–34.0)
MCHC: 33 g/dL (ref 30.0–36.0)
MCV: 88.7 fL (ref 80.0–100.0)
Monocytes Absolute: 2.9 10*3/uL — ABNORMAL HIGH (ref 0.1–1.0)
Monocytes Relative: 11 %
Neutro Abs: 21.7 10*3/uL — ABNORMAL HIGH (ref 1.7–7.7)
Neutrophils Relative %: 85 %
Platelets: 295 10*3/uL (ref 150–400)
RBC: 4.44 MIL/uL (ref 4.22–5.81)
RDW: 15.9 % — ABNORMAL HIGH (ref 11.5–15.5)
WBC: 25.7 10*3/uL — ABNORMAL HIGH (ref 4.0–10.5)
nRBC: 0 % (ref 0.0–0.2)

## 2020-11-14 LAB — COMPREHENSIVE METABOLIC PANEL
ALT: 17 U/L (ref 0–44)
AST: 29 U/L (ref 15–41)
Albumin: 4.1 g/dL (ref 3.5–5.0)
Alkaline Phosphatase: 96 U/L (ref 38–126)
Anion gap: 16 — ABNORMAL HIGH (ref 5–15)
BUN: 40 mg/dL — ABNORMAL HIGH (ref 6–20)
CO2: 40 mmol/L — ABNORMAL HIGH (ref 22–32)
Calcium: 9.2 mg/dL (ref 8.9–10.3)
Chloride: 88 mmol/L — ABNORMAL LOW (ref 98–111)
Creatinine, Ser: 1.11 mg/dL (ref 0.61–1.24)
GFR, Estimated: >60 mL/min (ref >60)
Glucose, Bld: 134 mg/dL — ABNORMAL HIGH (ref 70–99)
Potassium: 2.9 mmol/L — ABNORMAL LOW (ref 3.5–5.1)
Sodium: 144 mmol/L (ref 135–145)
Total Bilirubin: 0.8 mg/dL (ref 0.3–1.2)
Total Protein: 7.3 g/dL (ref 6.5–8.1)

## 2020-11-14 LAB — URINALYSIS, ROUTINE W REFLEX MICROSCOPIC
Bilirubin Urine: NEGATIVE
Bilirubin Urine: NEGATIVE
Glucose, UA: NEGATIVE mg/dL
Glucose, UA: NEGATIVE mg/dL
Hgb urine dipstick: NEGATIVE
Hgb urine dipstick: NEGATIVE
Ketones, ur: NEGATIVE mg/dL
Ketones, ur: NEGATIVE mg/dL
Leukocytes,Ua: NEGATIVE
Leukocytes,Ua: NEGATIVE
Nitrite: NEGATIVE
Nitrite: NEGATIVE
Protein, ur: NEGATIVE mg/dL
Protein, ur: NEGATIVE mg/dL
Specific Gravity, Urine: 1.036 — ABNORMAL HIGH (ref 1.005–1.030)
Specific Gravity, Urine: 1.038 — ABNORMAL HIGH (ref 1.005–1.030)
pH: 9 — ABNORMAL HIGH (ref 5.0–8.0)
pH: 8 (ref 5.0–8.0)

## 2020-11-14 LAB — MAGNESIUM: Magnesium: 1.7 mg/dL (ref 1.7–2.4)

## 2020-11-14 LAB — RAPID URINE DRUG SCREEN, HOSP PERFORMED
Amphetamines: NOT DETECTED
Barbiturates: NOT DETECTED
Benzodiazepines: NOT DETECTED
Cocaine: NOT DETECTED
Opiates: NOT DETECTED
Tetrahydrocannabinol: POSITIVE — AB

## 2020-11-14 LAB — TYPE AND SCREEN
ABO/RH(D): O POS
Antibody Screen: NEGATIVE

## 2020-11-14 LAB — RESP PANEL BY RT-PCR (FLU A&B, COVID) ARPGX2
Influenza A by PCR: NEGATIVE
Influenza B by PCR: NEGATIVE
SARS Coronavirus 2 by RT PCR: NEGATIVE

## 2020-11-14 LAB — PROTIME-INR
INR: 1 (ref 0.8–1.2)
Prothrombin Time: 12.8 seconds (ref 11.4–15.2)

## 2020-11-14 LAB — POC OCCULT BLOOD, ED: Fecal Occult Bld: NEGATIVE

## 2020-11-14 LAB — I-STAT CREATININE, ED: Creatinine, Ser: 0.9 mg/dL (ref 0.61–1.24)

## 2020-11-14 LAB — LIPASE, BLOOD: Lipase: 22 U/L (ref 11–51)

## 2020-11-14 MED ORDER — POTASSIUM CHLORIDE 10 MEQ/100ML IV SOLN
10.0000 meq | INTRAVENOUS | Status: AC
  Administered 2020-11-14 (×4): 10 meq via INTRAVENOUS
  Filled 2020-11-14 (×5): qty 100

## 2020-11-14 MED ORDER — POTASSIUM CHLORIDE 10 MEQ/100ML IV SOLN
10.0000 meq | INTRAVENOUS | Status: AC
  Administered 2020-11-14 (×2): 10 meq via INTRAVENOUS
  Filled 2020-11-14: qty 100

## 2020-11-14 MED ORDER — PANTOPRAZOLE SODIUM 40 MG IV SOLR: 40.0000 mg | Freq: Once | INTRAVENOUS | Status: DC

## 2020-11-14 MED ORDER — OCTREOTIDE ACETATE 500 MCG/ML IJ SOLN
50.0000 ug/h | INTRAMUSCULAR | Status: DC
  Administered 2020-11-14 (×2): 50 ug/h via INTRAVENOUS
  Filled 2020-11-14 (×5): qty 1

## 2020-11-14 MED ORDER — OCTREOTIDE LOAD VIA INFUSION
50.0000 ug | Freq: Once | INTRAVENOUS | Status: AC
  Administered 2020-11-14: 50 ug via INTRAVENOUS
  Filled 2020-11-14: qty 25

## 2020-11-14 MED ORDER — ONDANSETRON HCL 4 MG/2ML IJ SOLN
4.0000 mg | Freq: Once | INTRAMUSCULAR | Status: AC
  Administered 2020-11-14: 4 mg via INTRAVENOUS
  Filled 2020-11-14: qty 2

## 2020-11-14 MED ORDER — THIAMINE HCL 100 MG/ML IJ SOLN
Freq: Once | INTRAVENOUS | Status: AC
  Filled 2020-11-14: qty 1000

## 2020-11-14 MED ORDER — LORAZEPAM 1 MG PO TABS
0.0000 mg | ORAL_TABLET | Freq: Two times a day (BID) | ORAL | Status: DC
Start: 2020-11-16 — End: 2020-11-16

## 2020-11-14 MED ORDER — THIAMINE HCL 100 MG/ML IJ SOLN
100.0000 mg | Freq: Every day | INTRAMUSCULAR | Status: DC
  Administered 2020-11-14: 100 mg via INTRAVENOUS
  Filled 2020-11-14: qty 2

## 2020-11-14 MED ORDER — LORAZEPAM 2 MG/ML IJ SOLN
1.0000 mg | Freq: Once | INTRAMUSCULAR | Status: AC
  Administered 2020-11-14: 1 mg via INTRAVENOUS
  Filled 2020-11-14: qty 1

## 2020-11-14 MED ORDER — LORAZEPAM 2 MG/ML IJ SOLN
0.0000 mg | Freq: Four times a day (QID) | INTRAMUSCULAR | Status: DC
Start: 2020-11-14 — End: 2020-11-16
  Administered 2020-11-14: 2 mg via INTRAVENOUS
  Filled 2020-11-14: qty 1

## 2020-11-14 MED ORDER — SODIUM CHLORIDE (PF) 0.9 % IJ SOLN
INTRAMUSCULAR | Status: AC
  Filled 2020-11-14: qty 50

## 2020-11-14 MED ORDER — PANTOPRAZOLE INFUSION (NEW) - SIMPLE MED
8.0000 mg/h | INTRAVENOUS | Status: DC
  Administered 2020-11-14 (×2): 8 mg/h via INTRAVENOUS
  Filled 2020-11-14: qty 80
  Filled 2020-11-14 (×2): qty 100
  Filled 2020-11-14: qty 80

## 2020-11-14 MED ORDER — IOHEXOL 350 MG/ML SOLN
100.0000 mL | Freq: Once | INTRAVENOUS | Status: AC | PRN
  Administered 2020-11-14: 100 mL via INTRAVENOUS

## 2020-11-14 MED ORDER — LORAZEPAM 2 MG/ML IJ SOLN: 0.0000 mg | Freq: Two times a day (BID) | INTRAMUSCULAR | Status: DC

## 2020-11-14 MED ORDER — THIAMINE HCL 100 MG PO TABS
100.0000 mg | ORAL_TABLET | Freq: Every day | ORAL | Status: DC
  Administered 2020-11-15 – 2020-11-16 (×2): 100 mg via ORAL
  Filled 2020-11-14 (×2): qty 1

## 2020-11-14 MED ORDER — LORAZEPAM 1 MG PO TABS
0.0000 mg | ORAL_TABLET | Freq: Four times a day (QID) | ORAL | Status: DC
  Administered 2020-11-15 (×3): 1 mg via ORAL
  Filled 2020-11-14 (×2): qty 1
  Filled 2020-11-14: qty 2

## 2020-11-14 MED ORDER — SODIUM CHLORIDE 0.9 % IV BOLUS
1000.0000 mL | Freq: Once | INTRAVENOUS | Status: AC
  Administered 2020-11-14: 1000 mL via INTRAVENOUS

## 2020-11-14 MED ORDER — PANTOPRAZOLE 80MG IVPB - SIMPLE MED
80.0000 mg | Freq: Once | INTRAVENOUS | Status: AC
  Administered 2020-11-14: 80 mg via INTRAVENOUS
  Filled 2020-11-14: qty 80

## 2020-11-14 MED ORDER — PANTOPRAZOLE SODIUM 40 MG IV SOLR: 40.0000 mg | Freq: Two times a day (BID) | INTRAVENOUS | Status: DC

## 2020-11-14 NOTE — ED Provider Notes (Signed)
I provided a substantive portion of the care of this patient.  I personally performed the entirety of the history for this encounter.     Patient reports he is a daily alcohol consumer.  He typically drinks liquor.  Last alcohol consumption reported to be about 36 hours ago.  Patient reports he developed severe epigastric abdominal pain yesterday.  To triage he reported 3 days of symptoms.  To me he reported yesterday afternoon as onset of severe symptoms.  He reports has had vomiting continuously since yesterday.  He reports it is looking brown and bloody.  He reports he has severe sharp pain in his upper abdomen.  Patient reports he was compliant with educations including PPIs till about 2 days ago.  Patient is alert.  Very uncomfortable in appearance.  Slightly pale.  Mental status clear.  Dukas membranes are very dry.  Very poor dentition.  Heart tachycardic.  Lungs grossly clear to anterior auscultation.  Abdomen soft with diffuse upper abdominal pain.  No guarding.  Well-healed surgical scar.  No peripheral edema.  Feet are in good condition without wounds or swelling.  Femoral pulses are 2+ and symmetric.  Radial pulses intact bilaterally 1+.  Agree with plan of management.  Agree with initiating Protonix will do drip and bolus.  Agree with Sandostatin.  Will start rehydration and also multivitamin and thiamine for heavy alcohol use.  Will initiate low-dose of Ativan for possible associated or anticipated alcohol withdrawal.  Patient reports history of DTs if he stops drinking.  Patient has history of AAA repair.  With severe abdominal pain, history of GI bleed we will proceed with CT abdomen with angiogram.  CRITICAL CARE Performed by: Arby Barrette   Total critical care time: 30 minutes  Critical care time was exclusive of separately billable procedures and treating other patients.  Critical care was necessary to treat or prevent imminent or life-threatening deterioration.  Critical care  was time spent personally by me on the following activities: development of treatment plan with patient and/or surrogate as well as nursing, discussions with consultants, evaluation of patient's response to treatment, examination of patient, obtaining history from patient or surrogate, ordering and performing treatments and interventions, ordering and review of laboratory studies, ordering and review of radiographic studies, pulse oximetry and re-evaluation of patient's condition.    Arby Barrette, MD 11/14/20 [id number]

## 2020-11-14 NOTE — H&P (Addendum)
History and Physical    Justin Poole NFA:[id number] DOB: 02/06/1966 DOA: 11/14/2020  PCP: Claiborne RiggFleming, Justin W, NP  Patient coming from: Home  Chief Complaint: Poole/V  HPI: Justin Poole is a 55 y.o. male with medical history significant of EtOH abuse, asthma, HTN, pancreatitis. Presenting with Poole/V. Reports his symptoms started 3 days ago. He was unable to keep anything down. He tried getting some gatorage and pedialyte. However, he threw those up quickly as well. He had no sick contacts. No fevers. No chest pain or palpitations. He reports that this symptoms worsened this morning. He started vomit dark blood that turned more red as the morning progressed. He became concerned and came to the ED.    ED Course: Lab work showed elevated WBCs. It showed a stable Hgb. It showed a low K+. He was started on octreotide and protonix gtt.   Review of Systems:  Denies CP, dyspnea, palpitations, fevers, sick contacts, hematochezia, syncopal episodes. Reports Poole/V, hematemesis Review of systems is otherwise negative for all not mentioned in HPI.   PMHx Past Medical History:  Diagnosis Date   AAA (abdominal aortic aneurysm) (HCC)    Asthma    as a child   Dysrhythmia    pt states they have told him he "has an irregular heart beat"   GERD (gastroesophageal reflux disease)    Hypertension    Left bundle branch block    Pancreatitis    hospitalization 06/2018 for acute pancreatitis, likely relatead to alcohol use    PSHx Past Surgical History:  Procedure Laterality Date   AORTA - BILATERAL FEMORAL ARTERY BYPASS GRAFT Poole/A 05/22/2020   Procedure: OPEN ABDOMINAL AORTIC ANEURYSM REPAIR WITH HEMASHIELD GRAFT;  Surgeon: Justin Poole, Justin N, MD;  Location: [facility] OR;  Service: Vascular;  Laterality: Poole/A;   ESOPHAGOGASTRODUODENOSCOPY (EGD) WITH PROPOFOL Poole/A 05/25/2020   Procedure: ESOPHAGOGASTRODUODENOSCOPY (EGD) WITH PROPOFOL;  Surgeon: Justin Ristanis, Justin L III, MD;  Location: [facility] ENDOSCOPY;  Service: Gastroenterology;   Laterality: Poole/A;   HAND SURGERY Left     SocHx  reports that he has been smoking cigarettes. He has a 22.50 pack-year smoking history. He has never used smokeless tobacco. He reports current alcohol use. He reports previous drug use.  No Known Allergies  FamHx Family History  Problem Relation Age of Onset   CAD Mother        Died of MI 7360s   Heart attack Mother    CAD Father        Died of MI 1140s   Heart attack Father    Diabetes Mellitus II Neg Hx     Prior to Admission medications   Medication Sig Start Date End Date Taking? Authorizing Provider  amLODipine (NORVASC) 5 MG tablet Take 1 tablet by mouth once daily 07/31/20   Lewayne Poole, Justin S, MD  aspirin EC 81 MG EC tablet Take 1 tablet (81 mg total) by mouth daily at 6 (six) AM. Swallow whole. 05/30/20   Justin RutterEveland, Matthew, PA-C  Ca Carbonate-Mag Hydroxide (ROLAIDS PO) Take 1 tablet by mouth daily as needed (acid reflux).    [provider]  chlordiazePOXIDE (LIBRIUM) 25 MG capsule 50mg  PO TID x 1D, then 25-50mg  PO BID X 1D, then 25-50mg  PO QD X 1D 08/24/20   Justin Poole, Elizabeth, MD  HYDROcodone-acetaminophen (NORCO/VICODIN) 5-325 MG tablet Take 1 tablet by mouth every 6 (six) hours as needed for moderate pain. 07/20/20   Justin Poole, Joseph, MD  levalbuterol Justin Aus(Poole) 0.63 MG/3ML nebulizer solution Take 3 mLs (0.63  mg total) by nebulization every 6 (six) hours as needed for wheezing or shortness of breath. 05/30/20   Justin Rutter, PA-C  metoprolol tartrate (LOPRESSOR) 25 MG tablet Take 1 tablet (25 mg total) by mouth 2 (two) times daily. Patient not taking: Reported on 06/12/2020 05/30/20   Justin Rutter, PA-C  montelukast (SINGULAIR) 10 MG tablet Take 1 tablet (10 mg total) by mouth at bedtime. 05/30/20   Justin Rutter, PA-C  nicotine (NICODERM CQ - DOSED IN MG/24 HOURS) 21 mg/24hr patch Place 21 mg onto the skin daily.    [provider]  ondansetron (ZOFRAN ODT) 4 MG disintegrating tablet Take 1 tablet (4 mg total) by  mouth every 8 (eight) hours as needed for nausea or vomiting. 08/24/20   Justin Fossa, MD  pantoprazole (PROTONIX) 40 MG tablet Take 1 tablet (40 mg total) by mouth 2 (two) times daily. 05/30/20   Justin Rutter, PA-C  promethazine (PHENERGAN) 25 MG tablet Take 1 tablet (25 mg total) by mouth every 6 (six) hours as needed for nausea or vomiting. 07/20/20   Justin Berkshire, MD  rosuvastatin (CRESTOR) 40 MG tablet Take 1 tablet (40 mg total) by mouth daily. 04/07/20 07/06/20  Lewayne Bunting, MD  sucralfate (CARAFATE) 1 g tablet Take 1 tablet (1 g total) by mouth 4 (four) times daily -  with meals and at bedtime. 08/24/20   Justin Fossa, MD    Physical Exam: Vitals:   11/14/20 1245 11/14/20 1300 11/14/20 1315 11/14/20 1330  BP:  (!) 150/97  (!) 143/97  Pulse: (!) 109 (!) 106 (!) 105 (!) 106  Resp: (!) 22 17 (!) 22 (!) 21  Temp:      TempSrc:      SpO2: 100% 99% 99% 100%  Weight:      Height:        General: 55 y.o. ill appearing male resting in bed in NAD Eyes: PERRL, normal sclera ENMT: Nares patent Poole/o discharge, orophaynx clear, dentition poor, ears Poole/o discharge/lesions/ulcers Neck: Supple, trachea midline Cardiovascular: tachy, +S1, S2, no m/g/r, equal pulses throughout Respiratory: CTABL, no Poole/r/r, normal WOB GI: BS+, ND, TTP epigastric area, no masses noted, no organomegaly noted MSK: No e/c/c Neuro: A&O x 3, no focal deficits Psyc: Appropriate interaction and affect, calm/cooperative  Labs on Admission: I have personally reviewed following labs and imaging studies  CBC: Recent Labs  Lab 11/14/20 1027  WBC 25.7*  NEUTROABS 21.7*  HGB 13.0  HCT 39.4  MCV 88.7  PLT 295   Basic Metabolic Panel: Recent Labs  Lab 11/14/20 1027 11/14/20 1120  NA 144  --   K 2.9*  --   CL 88*  --   CO2 40*  --   GLUCOSE 134*  --   BUN 40*  --   CREATININE 1.11 0.90  CALCIUM 9.2  --    GFR: Estimated Creatinine Clearance: 69.7 mL/min (by C-G formula based on SCr of 0.9  mg/dL). Liver Function Tests: Recent Labs  Lab 11/14/20 1027  AST 29  ALT 17  ALKPHOS 96  BILITOT 0.8  PROT 7.3  ALBUMIN 4.1   Recent Labs  Lab 11/14/20 1027  LIPASE 22   No results for input(Poole): AMMONIA in the last 168 hours. Coagulation Profile: Recent Labs  Lab 11/14/20 1027  INR 1.0   Cardiac Enzymes: No results for input(Poole): CKTOTAL, CKMB, CKMBINDEX, TROPONINI in the last 168 hours. BNP (last 3 results) No results for input(Poole): PROBNP in the last 8760 hours. HbA1C: No  results for input(Poole): HGBA1C in the last 72 hours. CBG: No results for input(Poole): GLUCAP in the last 168 hours. Lipid Profile: No results for input(Poole): CHOL, HDL, LDLCALC, TRIG, CHOLHDL, LDLDIRECT in the last 72 hours. Thyroid Function Tests: No results for input(Poole): TSH, T4TOTAL, FREET4, T3FREE, THYROIDAB in the last 72 hours. Anemia Panel: No results for input(Poole): VITAMINB12, FOLATE, FERRITIN, TIBC, IRON, RETICCTPCT in the last 72 hours. Urine analysis:    Component Value Date/Time   COLORURINE YELLOW 11/14/2020 1245   APPEARANCEUR CLEAR 11/14/2020 1245   LABSPEC 1.036 (H) 11/14/2020 1245   PHURINE 9.0 (H) 11/14/2020 1245   GLUCOSEU NEGATIVE 11/14/2020 1245   HGBUR NEGATIVE 11/14/2020 1245   BILIRUBINUR NEGATIVE 11/14/2020 1245   KETONESUR NEGATIVE 11/14/2020 1245   PROTEINUR NEGATIVE 11/14/2020 1245   NITRITE NEGATIVE 11/14/2020 1245   LEUKOCYTESUR NEGATIVE 11/14/2020 1245    Radiological Exams on Admission: DG Chest Port 1 View  Result Date: 11/14/2020 CLINICAL DATA:  Coffee-ground emesis EXAM: PORTABLE CHEST 1 VIEW COMPARISON:  08/24/2020 FINDINGS: The heart size and mediastinal contours are within normal limits. Elevation of the left hemidiaphragm unchanged compared to prior examination. No acute airspace opacity. The visualized skeletal structures are unremarkable. IMPRESSION: No acute abnormality of the lungs in AP portable projection. Electronically Signed   By: Lauralyn Primes M.D.    On: 11/14/2020 11:12   CT Angio Chest/Abd/Pel for Dissection Poole and/or Poole/WO  Result Date: 11/14/2020 CLINICAL DATA:  Mid abdominal pain and vomiting since last night. History of alcoholism and abdominal aortic aneurysm repair. EXAM: CT ANGIOGRAPHY CHEST, ABDOMEN AND PELVIS TECHNIQUE: Multidetector CT imaging through the chest, abdomen and pelvis was performed using the standard protocol during bolus administration of intravenous contrast. Multiplanar reconstructed images and MIPs were obtained and reviewed to evaluate the vascular anatomy. CONTRAST:  OMNIPAQUE IOHEXOL 350 MG/ML SOLN COMPARISON:  08/24/2020. Portable chest obtained earlier today. FINDINGS: CTA CHEST FINDINGS Cardiovascular: Atheromatous calcifications, including the coronary arteries and aorta. Normally opacified thoracic aorta without aneurysm or dissection. Normally opacified pulmonary arteries with no pulmonary arterial filling defects seen. Mediastinum/Nodes: No enlarged mediastinal, hilar, or axillary lymph nodes. Thyroid gland, trachea, and esophagus demonstrate no significant findings. Lungs/Pleura: Lungs are clear. No pleural effusion or pneumothorax. Musculoskeletal: Minimal thoracic spine degenerative changes. Review of the MIP images confirms the above findings. CTA ABDOMEN AND PELVIS FINDINGS VASCULAR Aorta: Status post infrarenal abdominal aortic graft repair with a further reduction in size of the thrombosed aneurysmal sac, currently measuring 3.3 cm in maximum diameter. No recurrent aortic aneurysm or dissection. Celiac: Patent without evidence of aneurysm, dissection, vasculitis or significant stenosis. SMA: Patent without evidence of aneurysm, dissection, vasculitis or significant stenosis. Renals: Both renal arteries are patent without evidence of aneurysm, dissection, vasculitis, fibromuscular dysplasia or significant stenosis. There are 2 left renal arteries. IMA: Thrombosed proximally. Inflow: Stable mild aneurysmal  dilatation of the proximal left common iliac artery with a maximal diameter of 1.5 cm. Interval small, very short-segment dissection of the proximal left common iliac artery at its origin. Bilateral iliac atheromatous changes without significant luminal stenosis. Veins: No obvious venous abnormality within the limitations of this arterial phase study. Review of the MIP images confirms the above findings. NON-VASCULAR Hepatobiliary: Diffuse low density of the liver. Normal appearing gallbladder. Pancreas: Unremarkable. No pancreatic ductal dilatation or surrounding inflammatory changes. Spleen: Normal in size without focal abnormality. Adrenals/Urinary Tract: Adrenal glands are unremarkable. Kidneys are normal, without renal calculi, focal lesion, or hydronephrosis. Bladder is unremarkable. Stomach/Bowel: Stomach is  within normal limits. Appendix appears normal. No evidence of bowel wall thickening, distention, or inflammatory changes. Lymphatic: No enlarged lymph nodes. Reproductive: Prostate is unremarkable. Other: No abdominal wall hernia or abnormality. No abdominopelvic ascites. Musculoskeletal: Lumbar spine degenerative changes. Review of the MIP images confirms the above findings. IMPRESSION: 1. No aortic aneurysm or dissection. 2. 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 3. 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 4. Diffuse hepatic steatosis. 5.  Calcific coronary artery and aortic atherosclerosis. Electronically Signed   By: Beckie Salts M.D.   On: 11/14/2020 12:08    Assessment/Plan GIB Poole/V     - admit to inpt, SDU     - NPO     - continue octreotide, protonix gtt     - fluids, anti-emetics     - q6h H&H     - [facility] consulted, appreciate assistance  EtOH abuse     - continue CIWA     - banana bag     - thiamine/folate daily     - counsel against further use  Hypokalemia     - replace K+; check  Mg2+  Leukocytosis     - reactive?     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   Difficult to place patient No  Time spent coordinating admission: 70 minutes  Griffen Frayne A Mysha Peeler DO [facility]  If 7PM-7AM, please contact night-coverage [email or web address]  11/14/2020, 1:34 PM

## 2020-11-14 NOTE — ED Notes (Signed)
While chaperoning Peter, PA, pt's O2 noted to be 87% on RA.  Per Theron Arista, PA O2 at 2L/min via [facility] applied improvement in O2 sat to 97%.

## 2020-11-14 NOTE — ED Triage Notes (Signed)
Patient presented to the ed with c/o coffee ground emesis for 3 days. Patient report no oral intake for 3 days. Patient given 4 mg of Zofran and NS 500 ml per EMS.

## 2020-11-14 NOTE — Care Management (Signed)
-   Received consult for coffee-ground emesis.  Case discussed with ED  PA.  Patient is currently stable. -Chart reviewed briefly.  Normal hemoglobin of 13.  Normal LFTs.  Normal INR.CT angio showed normal-appearing liver and no evidence of esophageal varices.  Patient with history of severe LA grade D erosive esophagitis that was seen on EGD in January 2022.  There was no mention of esophageal varices at that time.   Recommendations ------------------------- -Upper GI bleed most likely from esophagitis. -Continue current management -Okay to have clear liquid diet today -Keep n.p.o. past midnight for possible EGD tomorrow. -Monitor H&H. -Full consult to follow tomorrow.  Call us back earlier if needed.  Kathi Der MD, FACP 11/14/2020, 1:49 PM  Contact #  [phone]

## 2020-11-14 NOTE — ED Provider Notes (Signed)
[facility]-EMERGENCY DEPT Provider Note   CSN: [id number] Arrival date & time: 11/14/20  6433     History No chief complaint on file.   Justin Poole is a 55 y.o. male with a history of AAA, hypertension, pancreatitis, GERD.  Patient endorses daily alcohol use.  States that he typically drinks liquor.  Patient reports he has not had any alcohol in the last 3 days.Patient presents emerged department with a chief complaint of abdominal pain and hematemesis.  Patient reports that abdominal pain began yesterday.  Pain has been constant since then.  Patient rates pain 8/10 on a pain scale.  Denies any alleviating or aggravating factors.  Patient endorses nausea and vomiting.  States that he had multiple episodes of hematemesis this morning.  Patient estimates blood loss at approximately 16 ounces.  Describes emesis as dark in color.  Also endorses fevers, chills, lightheadedness, and melena.  Denies any abdominal distention,dysuria, hematuria, urinary frequency, syncope, tremors or hallucinations.     HPI     Past Medical History:  Diagnosis Date   AAA (abdominal aortic aneurysm) (HCC)    Asthma    as a child   Dysrhythmia    pt states they have told him he "has an irregular heart beat"   GERD (gastroesophageal reflux disease)    Hypertension    Left bundle branch block    Pancreatitis    hospitalization 06/2018 for acute pancreatitis, likely relatead to alcohol use    Patient Active Problem List   Diagnosis Date Noted   Hematemesis with nausea    Acute blood loss anemia    Heartburn    Leukocytosis    Alcohol use 06/09/2018   Essential hypertension 06/09/2018   GERD (gastroesophageal reflux disease) 06/09/2018   AAA (abdominal aortic aneurysm) (HCC) 06/09/2018   Acute pancreatitis 06/08/2018   Hypercalcemia 06/08/2018   Hypokalemia 06/08/2018    Past Surgical History:  Procedure Laterality Date   AORTA - BILATERAL FEMORAL ARTERY BYPASS GRAFT N/A  05/22/2020   Procedure: OPEN ABDOMINAL AORTIC ANEURYSM REPAIR WITH HEMASHIELD GRAFT;  Surgeon: Leonie Douglas, MD;  Location: [facility] OR;  Service: Vascular;  Laterality: N/A;   ESOPHAGOGASTRODUODENOSCOPY (EGD) WITH PROPOFOL N/A 05/25/2020   Procedure: ESOPHAGOGASTRODUODENOSCOPY (EGD) WITH PROPOFOL;  Surgeon: Sherrilyn Rist, MD;  Location: [facility] ENDOSCOPY;  Service: Gastroenterology;  Laterality: N/A;   HAND SURGERY Left        Family History  Problem Relation Age of Onset   CAD Mother        Died of MI 27s   Heart attack Mother    CAD Father        Died of MI 12s   Heart attack Father    Diabetes Mellitus II Neg Hx     Social History   Tobacco Use   Smoking status: Every Day    Packs/day: 0.50    Years: 45.00    Pack years: 22.50    Types: Cigarettes   Smokeless tobacco: Never  Vaping Use   Vaping Use: Never used  Substance Use Topics   Alcohol use: Yes    Comment: 1/2 pint a day    Drug use: Not Currently    Home Medications Prior to Admission medications   Medication Sig Start Date End Date Taking? Authorizing Provider  amLODipine (NORVASC) 5 MG tablet Take 1 tablet by mouth once daily 07/31/20   Lewayne Bunting, MD  aspirin EC 81 MG EC tablet Take 1 tablet (81  mg total) by mouth daily at 6 (six) AM. Swallow whole. 05/30/20   Emilie Rutter, PA-C  Ca Carbonate-Mag Hydroxide (ROLAIDS PO) Take 1 tablet by mouth daily as needed (acid reflux).    [provider]  chlordiazePOXIDE (LIBRIUM) 25 MG capsule 50mg  PO TID x 1D, then 25-50mg  PO BID X 1D, then 25-50mg  PO QD X 1D 08/24/20   08/26/20, MD  HYDROcodone-acetaminophen (NORCO/VICODIN) 5-325 MG tablet Take 1 tablet by mouth every 6 (six) hours as needed for moderate pain. 07/20/20   07/22/20, MD  levalbuterol Bethann Berkshire) 0.63 MG/3ML nebulizer solution Take 3 mLs (0.63 mg total) by nebulization every 6 (six) hours as needed for wheezing or shortness of breath. 05/30/20   06/01/20, PA-C  metoprolol  tartrate (LOPRESSOR) 25 MG tablet Take 1 tablet (25 mg total) by mouth 2 (two) times daily. Patient not taking: Reported on 06/12/2020 05/30/20   06/01/20, PA-C  montelukast (SINGULAIR) 10 MG tablet Take 1 tablet (10 mg total) by mouth at bedtime. 05/30/20   06/01/20, PA-C  nicotine (NICODERM CQ - DOSED IN MG/24 HOURS) 21 mg/24hr patch Place 21 mg onto the skin daily.    [provider]  ondansetron (ZOFRAN ODT) 4 MG disintegrating tablet Take 1 tablet (4 mg total) by mouth every 8 (eight) hours as needed for nausea or vomiting. 08/24/20   08/26/20, MD  pantoprazole (PROTONIX) 40 MG tablet Take 1 tablet (40 mg total) by mouth 2 (two) times daily. 05/30/20   06/01/20, PA-C  promethazine (PHENERGAN) 25 MG tablet Take 1 tablet (25 mg total) by mouth every 6 (six) hours as needed for nausea or vomiting. 07/20/20   07/22/20, MD  rosuvastatin (CRESTOR) 40 MG tablet Take 1 tablet (40 mg total) by mouth daily. 04/07/20 07/06/20  07/08/20, MD  sucralfate (CARAFATE) 1 g tablet Take 1 tablet (1 g total) by mouth 4 (four) times daily -  with meals and at bedtime. 08/24/20   08/26/20, MD    Allergies    Patient has no known allergies.  Review of Systems   Review of Systems  Constitutional:  Negative for chills and fever.  Eyes:  Negative for visual disturbance.  Respiratory:  Negative for shortness of breath.   Cardiovascular:  Negative for chest pain.  Gastrointestinal:  Positive for blood in stool (melena), nausea and vomiting. Negative for abdominal distention, abdominal pain, anal bleeding, constipation, diarrhea and rectal pain.  Genitourinary:  Negative for difficulty urinating, dysuria, frequency and hematuria.  Musculoskeletal:  Negative for back pain and neck pain.  Skin:  Negative for color change and rash.  Neurological:  Positive for light-headedness. Negative for dizziness, syncope and headaches.  Psychiatric/Behavioral:  Negative for  confusion.    Physical Exam Updated Vital Signs BP (!) 157/97 (BP Location: Left Arm)   Pulse (!) 127   Temp 98 F (36.7 C) (Oral)   Resp 18   Ht 5\' 2"  (1.575 m)   Wt 54.4 kg   SpO2 98%   BMI 21.95 kg/m   Physical Exam Vitals and nursing note reviewed.  Constitutional:      General: He is not in acute distress.    Appearance: He is ill-appearing. He is not toxic-appearing or diaphoretic.  HENT:     Head: Normocephalic.     Mouth/Throat:     Dentition: Abnormal dentition. Dental caries present.     Pharynx: Oropharynx is clear. Uvula midline. No pharyngeal swelling, oropharyngeal exudate, posterior oropharyngeal  erythema or uvula swelling.     Comments: Poor dentition with multiple missing teeth and dental caries.  Dark dried blood noted on patient's tongue and lips Eyes:     General: No scleral icterus.       Right eye: No discharge.        Left eye: No discharge.  Cardiovascular:     Rate and Rhythm: Tachycardia present.     Pulses:          Radial pulses are 1+ on the right side and 1+ on the left side.     Comments: Tachycardic at rate of 127 Pulmonary:     Effort: Pulmonary effort is normal. No tachypnea, bradypnea or respiratory distress.     Breath sounds: Normal breath sounds. No stridor.  Abdominal:     General: Abdomen is flat. A surgical scar is present. Bowel sounds are normal. There is no distension. There are no signs of injury.     Palpations: Abdomen is soft. There is no mass or pulsatile mass.     Tenderness: There is abdominal tenderness in the epigastric area. There is no guarding or rebound.     Comments: Well-healed midline surgical scar  Musculoskeletal:     Cervical back: Neck supple.  Skin:    General: Skin is warm and dry.     Coloration: Skin is not cyanotic, jaundiced or pale.  Neurological:     General: No focal deficit present.     Mental Status: He is alert.  Psychiatric:        Behavior: Behavior is cooperative.    ED Results /  Procedures / Treatments   Labs (all labs ordered are listed, but only abnormal results are displayed) Labs Reviewed  CBC WITH DIFFERENTIAL/PLATELET - Abnormal; Notable for the following components:      Result Value   WBC 25.7 (*)    RDW 15.9 (*)    Neutro Abs 21.7 (*)    Monocytes Absolute 2.9 (*)    Abs Immature Granulocytes 0.23 (*)    All other components within normal limits  COMPREHENSIVE METABOLIC PANEL - Abnormal; Notable for the following components:   Potassium 2.9 (*)    Chloride 88 (*)    CO2 40 (*)    Glucose, Bld 134 (*)    BUN 40 (*)    Anion gap 16 (*)    All other components within normal limits  URINALYSIS, ROUTINE W REFLEX MICROSCOPIC - Abnormal; Notable for the following components:   Specific Gravity, Urine 1.036 (*)    pH 9.0 (*)    All other components within normal limits  LIPASE, BLOOD  PROTIME-INR  I-STAT CREATININE, ED  POC OCCULT BLOOD, ED  TYPE AND SCREEN    EKG EKG Interpretation  Date/Time:  Saturday November 14 2020 11:12:52 EDT Ventricular Rate:  118 PR Interval:  138 QRS Duration: 137 QT Interval:  369 QTC Calculation: 517 R Axis:   101 Text Interpretation: Sinus tachycardia Probable anterolateral infarct, acute Abnormal T, probable ischemia, lateral leads old LBBB, no sig interval change from previous Confirmed by Arby Barrette [phone]) on 11/14/2020 11:26:05 AM  Radiology DG Chest Port 1 View  Result Date: 11/14/2020 CLINICAL DATA:  Coffee-ground emesis EXAM: PORTABLE CHEST 1 VIEW COMPARISON:  08/24/2020 FINDINGS: The heart size and mediastinal contours are within normal limits. Elevation of the left hemidiaphragm unchanged compared to prior examination. No acute airspace opacity. The visualized skeletal structures are unremarkable. IMPRESSION: No acute abnormality of the lungs in  AP portable projection. Electronically Signed   By: Lauralyn Primes M.D.   On: 11/14/2020 11:12   CT Angio Chest/Abd/Pel for Dissection W and/or W/WO  Result  Date: 11/14/2020 CLINICAL DATA:  Mid abdominal pain and vomiting since last night. History of alcoholism and abdominal aortic aneurysm repair. EXAM: CT ANGIOGRAPHY CHEST, ABDOMEN AND PELVIS TECHNIQUE: Multidetector CT imaging through the chest, abdomen and pelvis was performed using the standard protocol during bolus administration of intravenous contrast. Multiplanar reconstructed images and MIPs were obtained and reviewed to evaluate the vascular anatomy. CONTRAST:  OMNIPAQUE IOHEXOL 350 MG/ML SOLN COMPARISON:  08/24/2020. Portable chest obtained earlier today. FINDINGS: CTA CHEST FINDINGS Cardiovascular: Atheromatous calcifications, including the coronary arteries and aorta. Normally opacified thoracic aorta without aneurysm or dissection. Normally opacified pulmonary arteries with no pulmonary arterial filling defects seen. Mediastinum/Nodes: No enlarged mediastinal, hilar, or axillary lymph nodes. Thyroid gland, trachea, and esophagus demonstrate no significant findings. Lungs/Pleura: Lungs are clear. No pleural effusion or pneumothorax. Musculoskeletal: Minimal thoracic spine degenerative changes. Review of the MIP images confirms the above findings. CTA ABDOMEN AND PELVIS FINDINGS VASCULAR Aorta: Status post infrarenal abdominal aortic graft repair with a further reduction in size of the thrombosed aneurysmal sac, currently measuring 3.3 cm in maximum diameter. No recurrent aortic aneurysm or dissection. Celiac: Patent without evidence of aneurysm, dissection, vasculitis or significant stenosis. SMA: Patent without evidence of aneurysm, dissection, vasculitis or significant stenosis. Renals: Both renal arteries are patent without evidence of aneurysm, dissection, vasculitis, fibromuscular dysplasia or significant stenosis. There are 2 left renal arteries. IMA: Thrombosed proximally. 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  Interval small, very short-segment dissection of the proximal left common iliac artery at its origin. Bilateral iliac atheromatous changes without significant luminal stenosis. Veins: No obvious venous abnormality within the limitations of this arterial phase study. Review of the MIP images confirms the above findings. NON-VASCULAR Hepatobiliary: Diffuse low density of the liver. Normal appearing gallbladder. Pancreas: Unremarkable. No pancreatic ductal dilatation or surrounding inflammatory changes. Spleen: Normal in size without focal abnormality. Adrenals/Urinary Tract: Adrenal glands are unremarkable. Kidneys are normal, without renal calculi, focal lesion, or hydronephrosis. Bladder is unremarkable. Stomach/Bowel: Stomach is within normal limits. Appendix appears normal. No evidence of bowel wall thickening, distention, or inflammatory changes. Lymphatic: No enlarged lymph nodes. Reproductive: Prostate is unremarkable. Other: No abdominal wall hernia or abnormality. No abdominopelvic ascites. Musculoskeletal: Lumbar spine degenerative changes. Review of the MIP images confirms the above findings. IMPRESSION: 1. No aortic aneurysm or dissection. 2. 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 3. 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 4. Diffuse hepatic steatosis. 5.  Calcific coronary artery and aortic atherosclerosis. Electronically Signed   By: Beckie Salts M.D.   On: 11/14/2020 12:08    Procedures Procedures   Medications Ordered in ED Medications  octreotide (SANDOSTATIN) 2 mcg/mL load via infusion 50 mcg (50 mcg Intravenous Bolus from Bag 11/14/20 1148)    And  octreotide (SANDOSTATIN) 500 mcg in sodium chloride 0.9 % 250 mL (2 mcg/mL) infusion (50 mcg/hr Intravenous New Bag/Given 11/14/20 1149)  pantoprozole (PROTONIX) 80 mg /NS 100 mL infusion (8 mg/hr Intravenous New Bag/Given 11/14/20 1145)  pantoprazole (PROTONIX)  injection 40 mg (has no administration in time range)  LORazepam (ATIVAN) injection 0-4 mg (0 mg Intravenous Not Given 11/14/20 1152)    Or  LORazepam (ATIVAN)  tablet 0-4 mg ( Oral See Alternative 11/14/20 1152)  LORazepam (ATIVAN) injection 0-4 mg (has no administration in time range)    Or  LORazepam (ATIVAN) tablet 0-4 mg (has no administration in time range)  thiamine tablet 100 mg (has no administration in time range)    Or  thiamine (B-1) injection 100 mg (has no administration in time range)  sodium chloride (PF) 0.9 % injection (has no administration in time range)  potassium chloride 10 mEq in 100 mL IVPB (has no administration in time range)  ondansetron (ZOFRAN) injection 4 mg (4 mg Intravenous Given 11/14/20 1058)  sodium chloride 0.9 % bolus 1,000 mL (1,000 mLs Intravenous New Bag/Given 11/14/20 1056)  pantoprazole (PROTONIX) 80 mg /NS 100 mL IVPB (80 mg Intravenous New Bag/Given 11/14/20 1122)  sodium chloride 0.9 % 1,000 mL with thiamine 100 mg, folic acid 1 mg, multivitamins adult 10 mL infusion ( Intravenous New Bag/Given 11/14/20 1107)  LORazepam (ATIVAN) injection 1 mg (1 mg Intravenous Given 11/14/20 1056)  iohexol (OMNIPAQUE) 350 MG/ML injection 100 mL (100 mLs Intravenous Contrast Given 11/14/20 1127)    ED Course  I have reviewed the triage vital signs and the nursing notes.  Pertinent labs & imaging results that were available during my care of the patient were reviewed by me and considered in my medical decision making (see chart for details).  Clinical Course as of 11/14/20 1811  Sat Nov 14, 2020  1324 Spoke to hospitalist Dr. Ronaldo MiyamotoKyle who agreed to see the patient for admission. [PB]  1346 Spoke to GI Dr.Brahmbhatt with [facility].  Recommends acute patient on clear liquid diet at this time.  No further interventions needed from gastroenterology standpoint at this time.  They will see the patient for consult. [PB]    Clinical Course User Index [PB] Berneice HeinrichBadalamente, Tenia Goh  R, PA-C   MDM Rules/Calculators/A&P                          Alert ill-appearing 55 year old male no acute distress.  Patient is noted to be hypertensive and tachycardic.  Presents to the emergency department with a chief complaint of epigastric abdominal pain and hematemesis.  Patient estimates 16 ounces of dark blood loss from hematemesis.  Patient endorses daily alcohol use.  Patient reports that he has not had alcohol in the last 3 days.  Abdomen soft, nondistended, tenderness to epigastrium.  No guarding or rebound tenderness.  Hemoccult negative.  No signs of melena or frank red blood on rectal exam.  Patient tachycardic at rate of 127.  Due to patient's daily alcohol use concern for possible esophageal varices rupture.  Will start patient on Protonix and octreotide.  Will obtain CTA chest, abdomen, and pelvis.  Will initiate CIWA protocol.  Patient given 1 L fluid bolus and started on maintenance fluids.  CBC shows leukocytosis at 25.7, no signs of anemia. CMP shows BUN increased at 40, likely secondary to dehydration.  Potassium decreased at 2.9; will replete with IV potassium.  Chloride decreased at 88. INR within normal limits Lipase within normal limits UA shows signs of dehydration.  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    Final Clinical Impression(s) / ED Diagnoses Final diagnoses:  Epigastric pain    Rx /  DC Orders ED Discharge Orders     None        Berneice Heinrich 11/14/20 1821    Arby Barrette, MD 12/03/20 [phone]

## 2020-11-15 ENCOUNTER — Encounter (HOSPITAL_COMMUNITY)
Admission: EM | Disposition: A | Discharge status: Home / Self Care | Payer: Self-pay | Source: Home / Self Care | Attending: Family Medicine

## 2020-11-15 ENCOUNTER — Encounter (HOSPITAL_COMMUNITY): Payer: Self-pay | Admitting: Family Medicine

## 2020-11-15 ENCOUNTER — Inpatient Hospital Stay (HOSPITAL_COMMUNITY): Payer: Medicaid Other | Admitting: Certified Registered Nurse Anesthetist

## 2020-11-15 DIAGNOSIS — K922 Gastrointestinal hemorrhage, unspecified: Secondary | ICD-10-CM

## 2020-11-15 DIAGNOSIS — K920 Hematemesis: Secondary | ICD-10-CM | POA: Diagnosis present

## 2020-11-15 HISTORY — PX: BIOPSY: SHX5522

## 2020-11-15 HISTORY — PX: ESOPHAGOGASTRODUODENOSCOPY (EGD) WITH PROPOFOL: SHX5813

## 2020-11-15 LAB — BASIC METABOLIC PANEL
Anion gap: 8 (ref 5–15)
BUN: 10 mg/dL (ref 6–20)
CO2: 33 mmol/L — ABNORMAL HIGH (ref 22–32)
Calcium: 8.3 mg/dL — ABNORMAL LOW (ref 8.9–10.3)
Chloride: 99 mmol/L (ref 98–111)
Creatinine, Ser: 0.76 mg/dL (ref 0.61–1.24)
GFR, Estimated: >60 mL/min (ref >60)
Glucose, Bld: 151 mg/dL — ABNORMAL HIGH (ref 70–99)
Potassium: 3.2 mmol/L — ABNORMAL LOW (ref 3.5–5.1)
Sodium: 140 mmol/L (ref 135–145)

## 2020-11-15 LAB — HIV ANTIBODY (ROUTINE TESTING W REFLEX): HIV Screen 4th Generation wRfx: NONREACTIVE

## 2020-11-15 LAB — CBC
HCT: 29.2 % — ABNORMAL LOW (ref 39.0–52.0)
Hemoglobin: 9.4 g/dL — ABNORMAL LOW (ref 13.0–17.0)
MCH: 29.8 pg (ref 26.0–34.0)
MCHC: 32.2 g/dL (ref 30.0–36.0)
MCV: 92.7 fL (ref 80.0–100.0)
Platelets: 168 10*3/uL (ref 150–400)
RBC: 3.15 MIL/uL — ABNORMAL LOW (ref 4.22–5.81)
RDW: 16.3 % — ABNORMAL HIGH (ref 11.5–15.5)
WBC: 12.3 10*3/uL — ABNORMAL HIGH (ref 4.0–10.5)
nRBC: 0 % (ref 0.0–0.2)

## 2020-11-15 LAB — HEMOGLOBIN AND HEMATOCRIT, BLOOD
HCT: 31 % — ABNORMAL LOW (ref 39.0–52.0)
Hemoglobin: 9.8 g/dL — ABNORMAL LOW (ref 13.0–17.0)

## 2020-11-15 SURGERY — ESOPHAGOGASTRODUODENOSCOPY (EGD) WITH PROPOFOL
Anesthesia: Monitor Anesthesia Care

## 2020-11-15 MED ORDER — EPHEDRINE SULFATE-NACL 50-0.9 MG/10ML-% IV SOSY
PREFILLED_SYRINGE | INTRAVENOUS | Status: DC | PRN
  Administered 2020-11-15: 10 mg via INTRAVENOUS

## 2020-11-15 MED ORDER — SODIUM CHLORIDE 0.9 % IV SOLN: INTRAVENOUS | Status: DC

## 2020-11-15 MED ORDER — PANTOPRAZOLE SODIUM 40 MG IV SOLR
40.0000 mg | Freq: Two times a day (BID) | INTRAVENOUS | Status: DC
  Administered 2020-11-15 – 2020-11-16 (×3): 40 mg via INTRAVENOUS
  Filled 2020-11-15 (×3): qty 40

## 2020-11-15 MED ORDER — PROPOFOL 10 MG/ML IV BOLUS
INTRAVENOUS | Status: DC | PRN
  Administered 2020-11-15 (×3): 50 mg via INTRAVENOUS

## 2020-11-15 MED ORDER — LACTATED RINGERS IV SOLN: INTRAVENOUS | Status: DC

## 2020-11-15 MED ORDER — SUCRALFATE 1 G PO TABS
1.0000 g | ORAL_TABLET | Freq: Three times a day (TID) | ORAL | Status: DC
  Administered 2020-11-15 – 2020-11-16 (×4): 1 g via ORAL
  Filled 2020-11-15 (×4): qty 1

## 2020-11-15 MED ORDER — PHENYLEPHRINE 40 MCG/ML (10ML) SYRINGE FOR IV PUSH (FOR BLOOD PRESSURE SUPPORT)
PREFILLED_SYRINGE | INTRAVENOUS | Status: DC | PRN
  Administered 2020-11-15: 120 ug via INTRAVENOUS

## 2020-11-15 MED ORDER — LIDOCAINE 2% (20 MG/ML) 5 ML SYRINGE
INTRAMUSCULAR | Status: DC | PRN
  Administered 2020-11-15: 40 mg via INTRAVENOUS

## 2020-11-15 MED ORDER — PROPOFOL 500 MG/50ML IV EMUL
INTRAVENOUS | Status: DC | PRN
  Administered 2020-11-15: 150 ug/kg/min via INTRAVENOUS

## 2020-11-15 SURGICAL SUPPLY — 15 items

## 2020-11-15 NOTE — Transfer of Care (Signed)
Immediate Anesthesia Transfer of Care Note  Patient: Justin Poole  Procedure(s) Performed: ESOPHAGOGASTRODUODENOSCOPY (EGD) WITH PROPOFOL BIOPSY  Patient Location: PACU and Endoscopy Unit  Anesthesia Type:MAC  Level of Consciousness: awake, alert  and oriented  Airway & Oxygen Therapy: Patient Spontanous Breathing and Patient connected to face mask  Post-op Assessment: Report given to RN and Post -op Vital signs reviewed and stable  Post vital signs: Reviewed and stable  Last Vitals:  Vitals Value Taken Time  BP 109/51 11/15/20 1010  Temp    Pulse 78 11/15/20 1011  Resp 26 11/15/20 1011  SpO2 100 % 11/15/20 1011  Vitals shown include unvalidated device data.  Last Pain:  Vitals:   11/15/20 1005  TempSrc:   PainSc: 0-No pain         Complications: No notable events documented.

## 2020-11-15 NOTE — ED Provider Notes (Signed)
Briefly discussed with Margie Ege with hospital service given he has returned from EGD to ER. Seems he does not have a bed assigned yet.  Dr. Jarvis Newcomer has written a note on pt. Will place his name back on pt.    Justin Poole, [place] 11/15/20 1114    Bethann Berkshire, MD 11/15/20 1652

## 2020-11-15 NOTE — Progress Notes (Signed)
PROGRESS NOTE  Justin Poole  ZDG:[id number] DOB: Oct 10, 1965 DOA: 11/14/2020 PCP: Claiborne Rigg, NP  Brief Narrative: Justin Poole is a 55 y.o. male with a history of EtOH abuse, asthma, HTN, AAA s/p repair, erosive esophagitis who developed nausea, vomiting a few days ago ultimately developing dark bloody emesis that became more light red prompting ED visit on 7/9. In the ED he was not hypotensive. WBC elevated, Hgb wnl at 13. Protonix and octreotide infusions started, GI consulted, and the patient was admitted. Vomiting has subsided. Repeat labs pending, and EGD is being considered.   Assessment & Plan: Active Problems:   GIB (gastrointestinal bleeding)  Nausea and vomiting, concern for hematemesis: EGD 05/25/2020 by Dr. Myrtie Neither for hematemesis showed severe ulcerated LA grade D esophagitis with evidence of recent bleeding.  - Continue antiemetics.  - Continue empiric octreotide and protonix infusions. No varices noted a recent EGD, though w/EtOH abuse, he is at risk.  - Restart IV fluids as he continues to appear dehydrated - Diet per GI, who will evaluate today for consideration of EGD.  - Recheck CBC  EtOH abuse:  - CIWA. No evidence of withdrawal currently.   Hypokalemia: Supplemented.  - Recheck metabolic panel.   Leukocytosis: Caused or at least exacerbated by hemoconcentration. No symptoms of focal bacterial nidus at this time. CTA C/A/P without evident source.  - Blood cultures pending.  - Monitor without abx for now.   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 Lenell Antu later today the implications of iliac artery findings.   Asthma: Quiescent.  - Continue prns - Incentive spirometry. Had mucous plug requiring bronchoscopy last admission.  DVT prophylaxis: SCD Code Status: Full Family Communication: None at bedside Disposition Plan:  Status is: Inpatient  Remains  inpatient appropriate because:Ongoing diagnostic testing needed not appropriate for outpatient work up and Inpatient level of care appropriate due to severity of illness  Dispo: The patient is from: Home              Anticipated d/c is to: Home              Patient currently is not medically stable to d/c.   Difficult to place patient No  Consultants:  GI  Procedures:  EGD   Antimicrobials: None   Subjective: Feels very weak diffusely, still feels dehydrated. No vomiting since yesterday, mild nausea present. Drinking some water. Upper abdominal pain is stable, moderate, unchanged. No chest pain or dyspnea. No gross bleeding. No leg pain.  Objective: Vitals:   11/15/20 0400 11/15/20 0500 11/15/20 0615 11/15/20 0700  BP: 135/88 138/89 130/79 107/68  Pulse: 95  81 80  Resp: 17  18   Temp:      TempSrc:      SpO2: 96%  100% 100%  Weight:      Height:        Intake/Output Summary (Last 24 hours) at 11/15/2020 0756 Last data filed at 11/14/2020 2039 Gross per 24 hour  Intake 2200 ml  Output --  Net 2200 ml   Filed Weights   11/14/20 0926  Weight: 54.4 kg    Gen: 55 y.o. male in no distress, weak appearing Pulm: Non-labored breathing. Clear to auscultation bilaterally.  CV: Regular rate and rhythm. No murmur, rub, or gallop. No JVD, no pedal edema. GI: Abdomen soft, tender in epigastrium without  rebound or guarding, elsewhere non-tender, non-distended, with normoactive bowel sounds. No organomegaly or masses felt. Ext: Warm, no deformities Skin: No rashes, lesions or ulcers. AAA laparotomy incision healed.  Neuro: Alert and oriented. No focal neurological deficits. Psych: Judgement and insight appear normal. Mood & affect appropriate.   Data Reviewed: I have personally reviewed following labs and imaging studies  CBC: Recent Labs  Lab 11/14/20 1027  WBC 25.7*  NEUTROABS 21.7*  HGB 13.0  HCT 39.4  MCV 88.7  PLT 295   Basic Metabolic Panel: Recent Labs  Lab  11/14/20 1027 11/14/20 1120  NA 144  --   K 2.9*  --   CL 88*  --   CO2 40*  --   GLUCOSE 134*  --   BUN 40*  --   CREATININE 1.11 0.90  CALCIUM 9.2  --   MG 1.7  --    GFR: Estimated Creatinine Clearance: 69.7 mL/min (by C-G formula based on SCr of 0.9 mg/dL). Liver Function Tests: Recent Labs  Lab 11/14/20 1027  AST 29  ALT 17  ALKPHOS 96  BILITOT 0.8  PROT 7.3  ALBUMIN 4.1   Recent Labs  Lab 11/14/20 1027  LIPASE 22   No results for input(s): AMMONIA in the last 168 hours. Coagulation Profile: Recent Labs  Lab 11/14/20 1027  INR 1.0   Cardiac Enzymes: No results for input(s): CKTOTAL, CKMB, CKMBINDEX, TROPONINI in the last 168 hours. BNP (last 3 results) No results for input(s): PROBNP in the last 8760 hours. HbA1C: No results for input(s): HGBA1C in the last 72 hours. CBG: No results for input(s): GLUCAP in the last 168 hours. Lipid Profile: No results for input(s): CHOL, HDL, LDLCALC, TRIG, CHOLHDL, LDLDIRECT in the last 72 hours. Thyroid Function Tests: No results for input(s): TSH, T4TOTAL, FREET4, T3FREE, THYROIDAB in the last 72 hours. Anemia Panel: No results for input(s): VITAMINB12, FOLATE, FERRITIN, TIBC, IRON, RETICCTPCT in the last 72 hours. Urine analysis:    Component Value Date/Time   COLORURINE YELLOW 11/14/2020 1429   APPEARANCEUR CLEAR 11/14/2020 1429   LABSPEC 1.038 (H) 11/14/2020 1429   PHURINE 8.0 11/14/2020 1429   GLUCOSEU NEGATIVE 11/14/2020 1429   HGBUR NEGATIVE 11/14/2020 1429   BILIRUBINUR NEGATIVE 11/14/2020 1429   KETONESUR NEGATIVE 11/14/2020 1429   PROTEINUR NEGATIVE 11/14/2020 1429   NITRITE NEGATIVE 11/14/2020 1429   LEUKOCYTESUR NEGATIVE 11/14/2020 1429   Recent Results (from the past 240 hour(s))  Resp Panel by RT-PCR (Flu A&B, Covid) Urine, Clean Catch     Status: None   Collection Time: 11/14/20  2:49 PM   Specimen: Urine, Clean Catch; Nasopharyngeal(NP) swabs in vial transport medium  Result Value Ref Range  Status   SARS Coronavirus 2 by RT PCR NEGATIVE NEGATIVE Final    Comment: (NOTE) SARS-CoV-2 target nucleic acids are NOT DETECTED.  The SARS-CoV-2 RNA is generally detectable in upper respiratory specimens during the acute phase of infection. The lowest concentration of SARS-CoV-2 viral copies this assay can detect is 138 copies/mL. A negative result does not preclude SARS-Cov-2 infection and should not be used as the sole basis for treatment or other patient management decisions. A negative result may occur with  improper specimen collection/handling, submission of specimen other than nasopharyngeal swab, presence of viral mutation(s) within the areas targeted by this assay, and inadequate number of viral copies(<138 copies/mL). A negative result must be combined with clinical observations, patient history, and epidemiological information. The expected result is Negative.  Fact Sheet for Patients:  BloggerCourse.comhttps://www.fda.gov/media/152166/download  Fact Sheet for Healthcare Providers:  SeriousBroker.ithttps://www.fda.gov/media/152162/download  This test is no t yet approved or cleared by the [place] FDA and  has been authorized for detection and/or diagnosis of SARS-CoV-2 by FDA under an Emergency Use Authorization (EUA). This EUA will remain  in effect (meaning this test can be used) for the duration of the COVID-19 declaration under Section 564(b)(1) of the Act, 21 U.S.C.section 360bbb-3(b)(1), unless the authorization is terminated  or revoked sooner.       Influenza A by PCR NEGATIVE NEGATIVE Final   Influenza B by PCR NEGATIVE NEGATIVE Final    Comment: (NOTE) The Xpert Xpress SARS-CoV-2/FLU/RSV plus assay is intended as an aid in the diagnosis of influenza from Nasopharyngeal swab specimens and should not be used as a sole basis for treatment. Nasal washings and aspirates are unacceptable for Xpert Xpress SARS-CoV-2/FLU/RSV testing.  Fact Sheet for  Patients: BloggerCourse.comhttps://www.fda.gov/media/152166/download  Fact Sheet for Healthcare Providers: SeriousBroker.ithttps://www.fda.gov/media/152162/download  This test is not yet approved or cleared by the [place] FDA and has been authorized for detection and/or diagnosis of SARS-CoV-2 by FDA under an Emergency Use Authorization (EUA). This EUA will remain in effect (meaning this test can be used) for the duration of the COVID-19 declaration under Section 564(b)(1) of the Act, 21 U.S.C. section 360bbb-3(b)(1), unless the authorization is terminated or revoked.  Performed at [facility], [address] [place], [place] [address]       Radiology Studies: DG Chest Port 1 View  Result Date: 11/14/2020 CLINICAL DATA:  Coffee-ground emesis EXAM: PORTABLE CHEST 1 VIEW COMPARISON:  08/24/2020 FINDINGS: The heart size and mediastinal contours are within normal limits. Elevation of the left hemidiaphragm unchanged compared to prior examination. No acute airspace opacity. The visualized skeletal structures are unremarkable. IMPRESSION: No acute abnormality of the lungs in AP portable projection. Electronically Signed   By: Lauralyn PrimesAlex  Bibbey M.D.   On: 11/14/2020 11:12   CT Angio Chest/Abd/Pel for Dissection W and/or W/WO  Result Date: 11/14/2020 CLINICAL DATA:  Mid abdominal pain and vomiting since last night. History of alcoholism and abdominal aortic aneurysm repair. EXAM: CT ANGIOGRAPHY CHEST, ABDOMEN AND PELVIS TECHNIQUE: Multidetector CT imaging through the chest, abdomen and pelvis was performed using the standard protocol during bolus administration of intravenous contrast. Multiplanar reconstructed images and MIPs were obtained and reviewed to evaluate the vascular anatomy. CONTRAST:  100mL OMNIPAQUE IOHEXOL 350 MG/ML SOLN COMPARISON:  08/24/2020. Portable chest obtained earlier today. FINDINGS: CTA CHEST FINDINGS Cardiovascular: Atheromatous calcifications, including the coronary arteries and  aorta. Normally opacified thoracic aorta without aneurysm or dissection. Normally opacified pulmonary arteries with no pulmonary arterial filling defects seen. Mediastinum/Nodes: No enlarged mediastinal, hilar, or axillary lymph nodes. Thyroid gland, trachea, and esophagus demonstrate no significant findings. Lungs/Pleura: Lungs are clear. No pleural effusion or pneumothorax. Musculoskeletal: Minimal thoracic spine degenerative changes. Review of the MIP images confirms the above findings. CTA ABDOMEN AND PELVIS FINDINGS VASCULAR Aorta: Status post infrarenal abdominal aortic graft repair with a further reduction in size of the thrombosed aneurysmal sac, currently measuring 3.3 cm in maximum diameter. No recurrent aortic aneurysm or dissection. Celiac: Patent without evidence of aneurysm, dissection, vasculitis or significant stenosis. SMA: Patent without evidence of aneurysm, dissection, vasculitis or significant stenosis. Renals: Both renal arteries are patent without evidence of aneurysm, dissection, vasculitis, fibromuscular dysplasia or significant stenosis. There are 2 left renal arteries. IMA: Thrombosed proximally. Inflow: Stable mild aneurysmal dilatation of the proximal left common iliac artery with a maximal  diameter of 1.5 cm. Interval small, very short-segment dissection of the proximal left common iliac artery at its origin. Bilateral iliac atheromatous changes without significant luminal stenosis. Veins: No obvious venous abnormality within the limitations of this arterial phase study. Review of the MIP images confirms the above findings. NON-VASCULAR Hepatobiliary: Diffuse low density of the liver. Normal appearing gallbladder. Pancreas: Unremarkable. No pancreatic ductal dilatation or surrounding inflammatory changes. Spleen: Normal in size without focal abnormality. Adrenals/Urinary Tract: Adrenal glands are unremarkable. Kidneys are normal, without renal calculi, focal lesion, or hydronephrosis.  Bladder is unremarkable. Stomach/Bowel: Stomach is within normal limits. Appendix appears normal. No evidence of bowel wall thickening, distention, or inflammatory changes. Lymphatic: No enlarged lymph nodes. Reproductive: Prostate is unremarkable. Other: No abdominal wall hernia or abnormality. No abdominopelvic ascites. Musculoskeletal: Lumbar spine degenerative changes. Review of the MIP images confirms the above findings. IMPRESSION: 1. No aortic aneurysm or dissection. 2. 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 3. 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 4. Diffuse hepatic steatosis. 5.  Calcific coronary artery and aortic atherosclerosis. Electronically Signed   By: Beckie Salts M.D.   On: 11/14/2020 12:08    Scheduled Meds:  LORazepam  0-4 mg Intravenous Q6H   Or   LORazepam  0-4 mg Oral Q6H   [START ON 11/16/2020] LORazepam  0-4 mg Intravenous Q12H   Or   [START ON 11/16/2020] LORazepam  0-4 mg Oral Q12H   [START ON 11/17/2020] pantoprazole  40 mg Intravenous Q12H   thiamine  100 mg Oral Daily   Or   thiamine  100 mg Intravenous Daily   Continuous Infusions:  octreotide  (SANDOSTATIN)    IV infusion 50 mcg/hr (11/14/20 2234)   pantoprazole 8 mg/hr (11/14/20 2233)     LOS: 1 day   Time spent: 35 minutes.  Tyrone Nine, MD [facility] [email or web address] 11/15/2020, 7:56 AM

## 2020-11-15 NOTE — Op Note (Signed)
[facility] Patient Name: Justin Poole Procedure Date: 11/15/2020 MRN: [id number] Attending MD: Kathi Der , MD Date of Birth: [birth year] CSN: [id number] Age: 55 Admit Type: Inpatient Procedure:                Upper GI endoscopy Indications:              Coffee-ground emesis Providers:                Kathi Der, MD, Vicki Mallet, RN, Michele Mcalpine Technician Referring MD:              Medicines:                Sedation Administered by an Anesthesia Professional Complications:            No immediate complications. Estimated Blood Loss:     Estimated blood loss was minimal. Procedure:                Pre-Anesthesia Assessment:                           - Prior to the procedure, a History and Physical                            was performed, and patient medications and                            allergies were reviewed. The patient's tolerance of                            previous anesthesia was also reviewed. The risks                            and benefits of the procedure and the sedation                            options and risks were discussed with the patient.                            All questions were answered, and informed consent                            was obtained. Prior Anticoagulants: The patient has                            taken no previous anticoagulant or antiplatelet                            agents. ASA Grade Assessment: III - A patient with                            severe systemic disease. After reviewing the risks  and benefits, the patient was deemed in                            satisfactory condition to undergo the procedure.                           After obtaining informed consent, the endoscope was                            passed under direct vision. Throughout the                            procedure, the patient's blood pressure, pulse, and                             oxygen saturations were monitored continuously. The                            [id number]-H190 ([id number] Olympus gastroscope was                            introduced through the mouth, and advanced to the                            second part of duodenum. The upper GI endoscopy was                            accomplished without difficulty. The patient                            tolerated the procedure well. Scope In: Scope Out: Findings:      LA Grade D (one or more mucosal breaks involving at least 75% of       esophageal circumference) esophagitis with no bleeding was found in the       mid and distal esophagus. Biopsies were taken with a cold forceps for       histology.      Scattered mild inflammation characterized by congestion (edema) and       erythema was found in the prepyloric region of the stomach. Biopsies       were taken with a cold forceps for histology.      The cardia and gastric fundus were normal on retroflexion.      The duodenal bulb, first portion of the duodenum and second portion of       the duodenum were normal. Impression:               - LA Grade D erosive esophagitis with no bleeding.                            Biopsied.                           - Gastritis. Biopsied.                           - Normal duodenal bulb,  first portion of the                            duodenum and second portion of the duodenum. Moderate Sedation:      Moderate (conscious) sedation was personally administered by an       anesthesia professional. The following parameters were monitored: oxygen       saturation, heart rate, blood pressure, and response to care. Recommendation:           - Return patient to hospital ward for ongoing care.                           - Soft diet.                           - Continue present medications.                           - Await pathology results. Procedure Code(s):        --- Professional ---                           [phone],  Esophagogastroduodenoscopy, flexible,                            transoral; with biopsy, single or multiple Diagnosis Code(s):        --- Professional ---                           K20.80, Other esophagitis without bleeding                           K29.70, Gastritis, unspecified, without bleeding                           K92.0, Hematemesis CPT copyright 2019 American Medical Association. All rights reserved. The codes documented in this report are preliminary and upon coder review may  be revised to meet current compliance requirements. Kathi Der, MD Kathi Der, MD 11/15/2020 10:04:28 AM Number of Addenda: 0

## 2020-11-15 NOTE — Brief Op Note (Signed)
11/14/2020 - 11/15/2020  10:04 AM  PATIENT:  Justin Poole  55 y.o. male  PRE-OPERATIVE DIAGNOSIS:  Upper GI bleed  POST-OPERATIVE DIAGNOSIS:  erosive esophagitis, biopsy   PROCEDURE:  Procedure(s): ESOPHAGOGASTRODUODENOSCOPY (EGD) WITH PROPOFOL (N/A) BIOPSY  SURGEON:  Surgeon(s) and Role:    * Rowynn Mcweeney, MD - Primary  Findings ---------- -EGD showed LA grade D erosive esophagitis and minimal gastritis.  No evidence of active bleeding.  Biopsies taken.  Recommendations ------------------------- -Recommend IV twice daily PPI while in the hospital.  Recommend discharging home on pantoprazole 40 mg twice a day for at least 3 months followed by pantoprazole 40 mg once a day for at least 6 months -Recommend Carafate twice a day for 4 weeks -Avoid alcohol.  Avoid smoking. -Avoid NSAIDs -No further inpatient GI work-up planned.  GI will sign off.  Call us back if needed  Kathi Der MD, FACP 11/15/2020, 10:06 AM  Contact #  [phone]

## 2020-11-15 NOTE — Anesthesia Postprocedure Evaluation (Signed)
Anesthesia Post Note  Patient: GEREMY RISTER  Procedure(s) Performed: ESOPHAGOGASTRODUODENOSCOPY (EGD) WITH PROPOFOL BIOPSY     Patient location during evaluation: PACU Anesthesia Type: MAC Level of consciousness: awake and alert Pain management: pain level controlled Vital Signs Assessment: post-procedure vital signs reviewed and stable Respiratory status: spontaneous breathing, nonlabored ventilation and respiratory function stable Cardiovascular status: blood pressure returned to baseline and stable Postop Assessment: no apparent nausea or vomiting Anesthetic complications: no   No notable events documented.  Last Vitals:  Vitals:   11/15/20 1010 11/15/20 1020  BP: (!) 109/51 (!) 183/74  Pulse: 96 95  Resp: (!) 31 (!) 26  Temp:    SpO2: 100% 99%    Last Pain:  Vitals:   11/15/20 1005  TempSrc: Axillary  PainSc: 0-No pain                 Lynda Rainwater

## 2020-11-15 NOTE — Consult Note (Signed)
Referring Provider:  ED Primary Care Physician:  Claiborne Rigg, NP Primary Gastroenterologist: Gentry Fitz  Reason for Consultation: Upper GI bleed  HPI: Justin Poole is a 55 y.o. male with past medical history of alcohol use  history of marijuana use and history of LA grade D esophagitis seen during EGD in January 2022 presented to the hospital with coffee-ground emesis of 3 days duration.  He is complaining of associated epigastric abdominal pain.  Denies any diarrhea or constipation.  Denies any blood in the stool or black stool.  Denies NSAID use.  He ran out of his PPI 3 days ago.  He stopped drinking alcohol after he started noticing coffee-ground emesis.  Normal hemoglobin of 13.  Normal LFTs.  Normal INR.CT angio showed normal-appearing liver and no evidence of esophageal varices.    Past Medical History:  Diagnosis Date   AAA (abdominal aortic aneurysm) (HCC)    Asthma    as a child   Dysrhythmia    pt states they have told him he "has an irregular heart beat"   GERD (gastroesophageal reflux disease)    Hypertension    Left bundle branch block    Pancreatitis    hospitalization 06/2018 for acute pancreatitis, likely relatead to alcohol use    Past Surgical History:  Procedure Laterality Date   AORTA - BILATERAL FEMORAL ARTERY BYPASS GRAFT N/A 05/22/2020   Procedure: OPEN ABDOMINAL AORTIC ANEURYSM REPAIR WITH HEMASHIELD GRAFT;  Surgeon: Leonie Douglas, MD;  Location: [facility] OR;  Service: Vascular;  Laterality: N/A;   ESOPHAGOGASTRODUODENOSCOPY (EGD) WITH PROPOFOL N/A 05/25/2020   Procedure: ESOPHAGOGASTRODUODENOSCOPY (EGD) WITH PROPOFOL;  Surgeon: Sherrilyn Rist, MD;  Location: [facility];  Service: Gastroenterology;  Laterality: N/A;   HAND SURGERY Left     Prior to Admission medications   Medication Sig Start Date End Date Taking? Authorizing Provider  amLODipine (NORVASC) 5 MG tablet Take 1 tablet by mouth once daily 07/31/20  Yes Crenshaw, Madolyn Frieze, MD  aspirin EC  81 MG EC tablet Take 1 tablet (81 mg total) by mouth daily at 6 (six) AM. Swallow whole. 05/30/20  Yes Emilie Rutter, PA-C  Ca Carbonate-Mag Hydroxide (ROLAIDS PO) Take 1 tablet by mouth daily as needed (acid reflux).   Yes [provider]  levalbuterol Pauline Aus) 0.63 MG/3ML nebulizer solution Take 3 mLs (0.63 mg total) by nebulization every 6 (six) hours as needed for wheezing or shortness of breath. 05/30/20  Yes Emilie Rutter, PA-C  metoprolol tartrate (LOPRESSOR) 25 MG tablet Take 1 tablet (25 mg total) by mouth 2 (two) times daily. 05/30/20  Yes Emilie Rutter, PA-C  montelukast (SINGULAIR) 10 MG tablet Take 1 tablet (10 mg total) by mouth at bedtime. 05/30/20  Yes Emilie Rutter, PA-C  pantoprazole (PROTONIX) 40 MG tablet Take 1 tablet (40 mg total) by mouth 2 (two) times daily. 05/30/20  Yes Emilie Rutter, PA-C  rosuvastatin (CRESTOR) 40 MG tablet Take 1 tablet (40 mg total) by mouth daily. 04/07/20 11/15/20 Yes Lewayne Bunting, MD  sucralfate (CARAFATE) 1 g tablet Take 1 tablet (1 g total) by mouth 4 (four) times daily -  with meals and at bedtime. 08/24/20  Yes Tilden Fossa, MD    Scheduled Meds:  LORazepam  0-4 mg Intravenous Q6H   Or   LORazepam  0-4 mg Oral Q6H   [START ON 11/16/2020] LORazepam  0-4 mg Intravenous Q12H   Or   [START ON 11/16/2020] LORazepam  0-4 mg Oral Q12H   [START  ON 11/17/2020] pantoprazole  40 mg Intravenous Q12H   thiamine  100 mg Oral Daily   Or   thiamine  100 mg Intravenous Daily   Continuous Infusions:  lactated ringers 100 mL/hr at 11/15/20 0817   octreotide  (SANDOSTATIN)    IV infusion 50 mcg/hr (11/14/20 2234)   pantoprazole 8 mg/hr (11/14/20 2233)   PRN Meds:.  Allergies as of 11/14/2020   (No Known Allergies)    Family History  Problem Relation Age of Onset   CAD Mother        Died of MI 104s   Heart attack Mother    CAD Father        Died of MI 66s   Heart attack Father    Diabetes Mellitus II Neg Hx     Social  History   Socioeconomic History   Marital status: Single    Spouse name: Not on file   Number of children: Not on file   Years of education: Not on file   Highest education level: Not on file  Occupational History   Not on file  Tobacco Use   Smoking status: Every Day    Packs/day: 0.50    Years: 45.00    Pack years: 22.50    Types: Cigarettes   Smokeless tobacco: Never  Vaping Use   Vaping Use: Never used  Substance and Sexual Activity   Alcohol use: Yes    Comment: 1/2 pint a day    Drug use: Not Currently   Sexual activity: Not Currently  Other Topics Concern   Not on file  Social History Narrative   Not on file   Social Determinants of Health   Financial Resource Strain: Not on file  Food Insecurity: Not on file  Transportation Needs: Not on file  Physical Activity: Not on file  Stress: Not on file  Social Connections: Not on file  Intimate Partner Violence: Not on file    Review of Systems: 12 point review of system is done which is negative except as mentioned in HPI.  Physical Exam: Vital signs: Vitals:   11/15/20 0615 11/15/20 0700  BP: 130/79 107/68  Pulse: 81 80  Resp: 18   Temp:    SpO2: 100% 100%     Physical Exam Constitutional:      General: He is not in acute distress.    Appearance: Normal appearance.  HENT:     Head: Normocephalic and atraumatic.     Nose: Nose normal.  Eyes:     Extraocular Movements: Extraocular movements intact.  Cardiovascular:     Rate and Rhythm: Normal rate and regular rhythm.     Heart sounds: No murmur heard. Pulmonary:     Effort: Pulmonary effort is normal. No respiratory distress.  Abdominal:     General: Bowel sounds are normal. There is no distension.     Palpations: Abdomen is soft.     Tenderness: There is no abdominal tenderness. There is no guarding.  Musculoskeletal:        General: Normal range of motion.     Cervical back: Normal range of motion.     Right lower leg: No edema.     Left  lower leg: No edema.  Skin:    General: Skin is warm.     Coloration: Skin is not jaundiced.  Neurological:     Mental Status: He is alert and oriented to person, place, and time.  Psychiatric:  Mood and Affect: Mood normal.        Behavior: Behavior normal.        Thought Content: Thought content normal.        Judgment: Judgment normal.     GI:  Lab Results: Recent Labs    11/14/20 1027  WBC 25.7*  HGB 13.0  HCT 39.4  PLT 295   BMET Recent Labs    11/14/20 1027 11/14/20 1120  NA 144  --   K 2.9*  --   CL 88*  --   CO2 40*  --   GLUCOSE 134*  --   BUN 40*  --   CREATININE 1.11 0.90  CALCIUM 9.2  --    LFT Recent Labs    11/14/20 1027  PROT 7.3  ALBUMIN 4.1  AST 29  ALT 17  ALKPHOS 96  BILITOT 0.8   PT/INR Recent Labs    11/14/20 1027  LABPROT 12.8  INR 1.0     Studies/Results: DG Chest Port 1 View  Result Date: 11/14/2020 CLINICAL DATA:  Coffee-ground emesis EXAM: PORTABLE CHEST 1 VIEW COMPARISON:  08/24/2020 FINDINGS: The heart size and mediastinal contours are within normal limits. Elevation of the left hemidiaphragm unchanged compared to prior examination. No acute airspace opacity. The visualized skeletal structures are unremarkable. IMPRESSION: No acute abnormality of the lungs in AP portable projection. Electronically Signed   By: Lauralyn PrimesAlex  Bibbey M.D.   On: 11/14/2020 11:12   CT Angio Chest/Abd/Pel for Dissection W and/or W/WO  Result Date: 11/14/2020 CLINICAL DATA:  Mid abdominal pain and vomiting since last night. History of alcoholism and abdominal aortic aneurysm repair. EXAM: CT ANGIOGRAPHY CHEST, ABDOMEN AND PELVIS TECHNIQUE: Multidetector CT imaging through the chest, abdomen and pelvis was performed using the standard protocol during bolus administration of intravenous contrast. Multiplanar reconstructed images and MIPs were obtained and reviewed to evaluate the vascular anatomy. CONTRAST:  100mL OMNIPAQUE IOHEXOL 350 MG/ML SOLN  COMPARISON:  08/24/2020. Portable chest obtained earlier today. FINDINGS: CTA CHEST FINDINGS Cardiovascular: Atheromatous calcifications, including the coronary arteries and aorta. Normally opacified thoracic aorta without aneurysm or dissection. Normally opacified pulmonary arteries with no pulmonary arterial filling defects seen. Mediastinum/Nodes: No enlarged mediastinal, hilar, or axillary lymph nodes. Thyroid gland, trachea, and esophagus demonstrate no significant findings. Lungs/Pleura: Lungs are clear. No pleural effusion or pneumothorax. Musculoskeletal: Minimal thoracic spine degenerative changes. Review of the MIP images confirms the above findings. CTA ABDOMEN AND PELVIS FINDINGS VASCULAR Aorta: Status post infrarenal abdominal aortic graft repair with a further reduction in size of the thrombosed aneurysmal sac, currently measuring 3.3 cm in maximum diameter. No recurrent aortic aneurysm or dissection. Celiac: Patent without evidence of aneurysm, dissection, vasculitis or significant stenosis. SMA: Patent without evidence of aneurysm, dissection, vasculitis or significant stenosis. Renals: Both renal arteries are patent without evidence of aneurysm, dissection, vasculitis, fibromuscular dysplasia or significant stenosis. There are 2 left renal arteries. IMA: Thrombosed proximally. 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 Interval small, very short-segment dissection of the proximal left common iliac artery at its origin. Bilateral iliac atheromatous changes without significant luminal stenosis. Veins: No obvious venous abnormality within the limitations of this arterial phase study. Review of the MIP images confirms the above findings. NON-VASCULAR Hepatobiliary: Diffuse low density of the liver. Normal appearing gallbladder. Pancreas: Unremarkable. No pancreatic ductal dilatation or surrounding inflammatory changes. Spleen:  Normal in size without focal abnormality. Adrenals/Urinary Tract: Adrenal glands are  unremarkable. Kidneys are normal, without renal calculi, focal lesion, or hydronephrosis. Bladder is unremarkable. Stomach/Bowel: Stomach is within normal limits. Appendix appears normal. No evidence of bowel wall thickening, distention, or inflammatory changes. Lymphatic: No enlarged lymph nodes. Reproductive: Prostate is unremarkable. Other: No abdominal wall hernia or abnormality. No abdominopelvic ascites. Musculoskeletal: Lumbar spine degenerative changes. Review of the MIP images confirms the above findings. IMPRESSION: 1. No aortic aneurysm or dissection. 2. 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 3. 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 4. Diffuse hepatic steatosis. 5.  Calcific coronary artery and aortic atherosclerosis. Electronically Signed   By: Beckie Salts M.D.   On: 11/14/2020 12:08    Impression/Plan: -Coffee-ground emesis in a patient with alcohol use and history of severe LA grade D esophagitis.  Most likely from esophagitis.  Normal LFTs.  Normal INR.  CT scan negative for cirrhosis.  No evidence of esophageal varices during EGD in January 2022. -Leukocytosis.  Could be hemoconcentration. -Alcohol and marijuana use  Recommendations --------------------------- -Continue supportive care -Plan for EGD today. -Work-up for leukocytosis per admitting team -Absolute alcohol abstinence discussed with the patient.  Avoid NSAIDs.  Avoid smoking.  Risks (bleeding, infection, bowel perforation that could require surgery, sedation-related changes in cardiopulmonary systems), benefits (identification and possible treatment of source of symptoms, exclusion of certain causes of symptoms), and alternatives (watchful waiting, radiographic imaging studies, empiric medical treatment)  were explained to patient/family in detail and  patient wishes to proceed.     LOS: 1 day   Kathi Der  MD, FACP 11/15/2020, 8:45 AM  Contact #  [phone]

## 2020-11-15 NOTE — Anesthesia Preprocedure Evaluation (Signed)
Anesthesia Evaluation  Patient identified by MRN, date of birth, ID band Patient awake    Reviewed: Allergy & Precautions, NPO status , Patient's Chart, lab work & pertinent test results  Airway Mallampati: I  TM Distance: >3 FB Neck ROM: Full    Dental  (+) Poor Dentition, Missing, Loose,    Pulmonary asthma , Current Smoker and Patient abstained from smoking.,    breath sounds clear to auscultation       Cardiovascular hypertension, Pt. on medications + Peripheral Vascular Disease   Rhythm:Regular Rate:Normal     Neuro/Psych negative neurological ROS  negative psych ROS   GI/Hepatic Neg liver ROS, GERD  ,  Endo/Other    Renal/GU negative Renal ROS     Musculoskeletal negative musculoskeletal ROS (+)   Abdominal Normal abdominal exam  (+)   Peds  Hematology   Anesthesia Other Findings   Reproductive/Obstetrics                             Anesthesia Physical  Anesthesia Plan  ASA: III and emergent  Anesthesia Plan: MAC   Post-op Pain Management:    Induction: Intravenous  PONV Risk Score and Plan: 0 and Treatment may vary due to age or medical condition  Airway Management Planned: Nasal Cannula  Additional Equipment:   Intra-op Plan:   Post-operative Plan:   Informed Consent: I have reviewed the patients History and Physical, chart, labs and discussed the procedure including the risks, benefits and alternatives for the proposed anesthesia with the patient or authorized representative who has indicated his/her understanding and acceptance.     Dental advisory given  Plan Discussed with: CRNA  Anesthesia Plan Comments:         Anesthesia Quick Evaluation

## 2020-11-16 DIAGNOSIS — K920 Hematemesis: Principal | ICD-10-CM

## 2020-11-16 LAB — CBC
HCT: 29.2 % — ABNORMAL LOW (ref 39.0–52.0)
Hemoglobin: 9.3 g/dL — ABNORMAL LOW (ref 13.0–17.0)
MCH: 29.5 pg (ref 26.0–34.0)
MCHC: 31.8 g/dL (ref 30.0–36.0)
MCV: 92.7 fL (ref 80.0–100.0)
Platelets: 167 10*3/uL (ref 150–400)
RBC: 3.15 MIL/uL — ABNORMAL LOW (ref 4.22–5.81)
RDW: 15.7 % — ABNORMAL HIGH (ref 11.5–15.5)
WBC: 8.7 10*3/uL (ref 4.0–10.5)
nRBC: 0 % (ref 0.0–0.2)

## 2020-11-16 MED ORDER — SUCRALFATE 1 G PO TABS: 1.0000 g | ORAL_TABLET | Freq: Three times a day (TID) | ORAL | 0 refills | Status: AC

## 2020-11-16 MED ORDER — PANTOPRAZOLE SODIUM 40 MG PO TBEC: 40.0000 mg | DELAYED_RELEASE_TABLET | Freq: Two times a day (BID) | ORAL | 2 refills | Status: DC

## 2020-11-16 NOTE — TOC Transition Note (Signed)
Transition of Care [facility]) - CM/SW Discharge Note   Patient Details  Name: Justin Poole MRN: [id number] Date of Birth: 1965/12/11  [facility] CM/SW Contact:  Darleene Cleaver, LCSW Phone Number: 11/16/2020, 10:00 AM   Clinical Narrative:     CSW received consult that patient needs a PCP.  CSW reviewed patient's chart and she does have a PCP Bertram [facility], [phone], [address] [place], [place], [address].  CSW signing off.   Final next level of care: Home/Self Care Barriers to Discharge: Barriers Resolved   Patient Goals and CMS Choice Patient states their goals for this hospitalization and ongoing recovery are:: To return back home. CMS Medicare.gov Compare Post Acute Care list provided to:: Patient Choice offered to / list presented to : Patient  Discharge Placement  Patient discharging back home.                     Discharge Plan and Services                                     Social Determinants of Health (SDOH) Interventions     Readmission Risk Interventions No flowsheet data found.

## 2020-11-16 NOTE — Plan of Care (Signed)
Instructions were reviewed with patient. All questions were answered. Patient was transported to main entrance by wheelchair. ° °

## 2020-11-16 NOTE — Discharge Summary (Signed)
Physician Discharge Summary  Justin Justin Poole:[id number] DOB: 05/01/1966 Justin Justin Poole  PCP: Claiborne Rigg, NP  Admit date: Justin Poole Discharge date: 11/16/2020  Admitted From: Home Disposition: Home   Recommendations for Outpatient Follow-up:  Follow up with PCP in 1-2 weeks with repeat CBC Follow up with GI Please follow up on the following pending results:Gastric biopsy at EGD 11/15/2020 by Dr. Levora Angel.  Home Health: None Equipment/Devices: None Discharge Condition: Stable CODE STATUS: Full Diet recommendation: Heart healthy  Brief/Interim Summary: Justin Justin Poole is a 55 y.o. male with a history of EtOH abuse, asthma, HTN, AAA s/p repair, erosive esophagitis who developed nausea, vomiting a few days ago ultimately developing dark bloody emesis that became more light red prompting ED visit on 7/9. In the ED he was not hypotensive. WBC elevated, Hgb wnl at 13. Protonix and octreotide infusions started, GI consulted, and the patient was taken to EGD 7/10 which confirmed esophagitis and revealed gastritis without evidence of bleeding.   Discharge Diagnoses:  Active Problems:   GIB (gastrointestinal bleeding)   Hematemesis  Nausea and vomiting, concern for hematemesis: EGD 05/25/2020 by Dr. Myrtie Neither for hematemesis showed severe ulcerated LA grade D esophagitis with evidence of recent bleeding. EGD repeated this admission 7/10 by Dr. Levora Angel confirmed esophagitis and showed gastritis which was biopsied. No active or recent bleeding noted.  - Continue PPI BID x3 months, then daily for 6 months at least. Continue carafate for 4 weeks. Prescribed at discharge. Pt's symptoms have durably abated. Hgb stable.    EtOH abuse: - CIWA. No evidence of withdrawal currently.   Hypokalemia: Supplemented. - Recheck metabolic panel.   Leukocytosis: Caused or at least exacerbated by hemoconcentration. No symptoms of focal bacterial nidus at this time. CTA C/A/P without evident source. WBC  normalized rapidly without antimicrobial therapy. No further evaluation or treatment is currently planned. Return precautions discussed.  - Blood cultures pending. - Monitor without abx for now.   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 Chestine Spore, who reviewed the CTA and felt the findings do not require any further evaluation or management while hospitalized. The patient will keep routine follow up at [facility].    Asthma: Quiescent. - Continue prns - Incentive spirometry. Had mucous plug requiring bronchoscopy last admission.  Discharge Instructions Discharge Instructions     Diet - low sodium heart healthy   Complete by: As directed    Discharge instructions   Complete by: As directed    - Completely abstain from alcohol - Take protonix twice daily for 3 months, then daily for at least 6 months - Take carafate as directed for 4 weeks - Call Dr. Rich Brave office for biopsy results and to schedule a follow up appointment - If your symptoms return, seek medical attention right away.      Allergies as of 11/16/2020   No Known Allergies      Medication List     TAKE these medications    amLODipine 5 MG tablet Commonly known as: NORVASC Take 1 tablet by mouth once daily   aspirin 81 MG EC tablet Take 1 tablet (81 mg total) by mouth daily at 6 (six) AM. Swallow whole.   levalbuterol 0.63 MG/3ML nebulizer solution Commonly known as: XOPENEX Take 3 mLs (0.63 mg total) by nebulization every 6 (six) hours as needed for wheezing or shortness of breath.   metoprolol  tartrate 25 MG tablet Commonly known as: LOPRESSOR Take 1 tablet (25 mg total) by mouth 2 (two) times daily.   montelukast 10 MG tablet Commonly known as: SINGULAIR Take 1 tablet (10 mg total) by mouth at bedtime.   pantoprazole 40 MG tablet Commonly known as: PROTONIX Take 1 tablet (40 mg total) by  mouth 2 (two) times daily.   ROLAIDS PO Take 1 tablet by mouth daily as needed (acid reflux).   rosuvastatin 40 MG tablet Commonly known as: CRESTOR Take 1 tablet (40 mg total) by mouth daily.   sucralfate 1 g tablet Commonly known as: Carafate Take 1 tablet (1 g total) by mouth 4 (four) times daily -  with meals and at bedtime for 28 days.        Follow-up Information     Claiborne RiggFleming, Zelda W, NP Follow up.   Specialty: Nurse Practitioner Contact information: [address] [place] [address] [phone]         Kathi DerBrahmbhatt, Parag, MD Follow up.   Specialty: Gastroenterology Contact information: [address] [place] [address] [phone]                No Known Allergies  Consultations: GI  Procedures/Studies: DG Chest Port 1 View  Result Date: Justin Poole CLINICAL DATA:  Coffee-ground emesis EXAM: PORTABLE CHEST 1 VIEW COMPARISON:  08/24/2020 FINDINGS: The heart size and mediastinal contours are within normal limits. Elevation of the left hemidiaphragm unchanged compared to prior examination. No acute airspace opacity. The visualized skeletal structures are unremarkable. IMPRESSION: No acute abnormality of the lungs in AP portable projection. Electronically Signed   By: Lauralyn PrimesAlex  Bibbey M.D.   On: 11/14/2020 11:12   CT Angio Chest/Abd/Pel for Dissection W and/or W/WO  Result Date: Justin Poole CLINICAL DATA:  Mid abdominal pain and vomiting since last night. History of alcoholism and abdominal aortic aneurysm repair. EXAM: CT ANGIOGRAPHY CHEST, ABDOMEN AND PELVIS TECHNIQUE: Multidetector CT imaging through the chest, abdomen and pelvis was performed using the standard protocol during bolus administration of intravenous contrast. Multiplanar reconstructed images and MIPs were obtained and reviewed to evaluate the vascular anatomy. CONTRAST:  100mL OMNIPAQUE IOHEXOL 350 MG/ML SOLN COMPARISON:  08/24/2020. Portable chest obtained earlier today.  FINDINGS: CTA CHEST FINDINGS Cardiovascular: Atheromatous calcifications, including the coronary arteries and aorta. Normally opacified thoracic aorta without aneurysm or dissection. Normally opacified pulmonary arteries with no pulmonary arterial filling defects seen. Mediastinum/Nodes: No enlarged mediastinal, hilar, or axillary lymph nodes. Thyroid gland, trachea, and esophagus demonstrate no significant findings. Lungs/Pleura: Lungs are clear. No pleural effusion or pneumothorax. Musculoskeletal: Minimal thoracic spine degenerative changes. Review of the MIP images confirms the above findings. CTA ABDOMEN AND PELVIS FINDINGS VASCULAR Aorta: Status post infrarenal abdominal aortic graft repair with a further reduction in size of the thrombosed aneurysmal sac, currently measuring 3.3 cm in maximum diameter. No recurrent aortic aneurysm or dissection. Celiac: Patent without evidence of aneurysm, dissection, vasculitis or significant stenosis. SMA: Patent without evidence of aneurysm, dissection, vasculitis or significant stenosis. Renals: Both renal arteries are patent without evidence of aneurysm, dissection, vasculitis, fibromuscular dysplasia or significant stenosis. There are 2 left renal arteries. IMA: Thrombosed proximally. 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 Interval small, very short-segment dissection of the proximal left common iliac artery at its origin. Bilateral iliac atheromatous changes without significant luminal stenosis. Veins: No obvious venous abnormality within the limitations of this arterial phase study.  Review of the MIP images confirms the above findings. NON-VASCULAR Hepatobiliary: Diffuse low density of the liver. Normal appearing gallbladder. Pancreas: Unremarkable. No pancreatic ductal dilatation or surrounding inflammatory changes. Spleen: Normal in size without focal abnormality. Adrenals/Urinary Tract:  Adrenal glands are unremarkable. Kidneys are normal, without renal calculi, focal lesion, or hydronephrosis. Bladder is unremarkable. Stomach/Bowel: Stomach is within normal limits. Appendix appears normal. No evidence of bowel wall thickening, distention, or inflammatory changes. Lymphatic: No enlarged lymph nodes. Reproductive: Prostate is unremarkable. Other: No abdominal wall hernia or abnormality. No abdominopelvic ascites. Musculoskeletal: Lumbar spine degenerative changes. Review of the MIP images confirms the above findings. IMPRESSION: 1. No aortic aneurysm or dissection. 2. 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 3. 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 4. Diffuse hepatic steatosis. 5.  Calcific coronary artery and aortic atherosclerosis. Electronically Signed   By: Beckie Salts M.D.   On: 11/14/2020 12:08    EGD 11/15/2020 Dr. Levora Angel:  Impression:       - LA Grade D erosive esophagitis with no bleeding. Biopsied.                           - Gastritis. Biopsied.                           - Normal duodenal bulb, first portion of the duodenum and second portion of the duodenum. Recommendation: - Return patient to hospital ward for ongoing care.                           - Soft diet.                           - Continue present medications.                           - Await pathology results.  Subjective: Feels well, eating and drinking without nausea, vomiting, or bleeding. Wants to go home.   Discharge Exam: Vitals:   11/16/20 0052 11/16/20 0525  BP: 121/83 129/82  Pulse: 89 91  Resp: 20 20  Temp: 98.4 F (36.9 C) 98 F (36.7 C)  SpO2: 92% 92%   General: Pt is alert, awake, not in acute distress Cardiovascular: RRR, S1/S2 +, no rubs, no gallops Respiratory: CTA bilaterally, no wheezing, no rhonchi Abdominal: Soft, NT, ND, bowel sounds + Extremities: No edema, no cyanosis  Labs: BNP  (last 3 results) No results for input(s): BNP in the last 8760 hours. Basic Metabolic Panel: Recent Labs  Lab 11/14/20 1027 11/14/20 1120 11/15/20 0820  NA 144  --  140  K 2.9*  --  3.2*  CL 88*  --  99  CO2 40*  --  33*  GLUCOSE 134*  --  151*  BUN 40*  --  10  CREATININE 1.11 0.90 0.76  CALCIUM 9.2  --  8.3*  MG 1.7  --   --    Liver Function Tests: Recent Labs  Lab 11/14/20 1027  AST 29  ALT 17  ALKPHOS 96  BILITOT 0.8  PROT 7.3  ALBUMIN 4.1   Recent Labs  Lab 11/14/20 1027  LIPASE 22   No results for input(s): AMMONIA in the last 168 hours.  CBC: Recent Labs  Lab 11/14/20 1027 11/15/20 0820 11/15/20 1823 11/16/20 0342  WBC 25.7* 12.3*  --  8.7  NEUTROABS 21.7*  --   --   --   HGB 13.0 9.4* 9.8* 9.3*  HCT 39.4 29.2* 31.0* 29.2*  MCV 88.7 92.7  --  92.7  PLT 295 168  --  167   Cardiac Enzymes: No results for input(s): CKTOTAL, CKMB, CKMBINDEX, TROPONINI in the last 168 hours. BNP: Invalid input(s): POCBNP CBG: No results for input(s): GLUCAP in the last 168 hours. D-Dimer No results for input(s): DDIMER in the last 72 hours. Hgb A1c No results for input(s): HGBA1C in the last 72 hours. Lipid Profile No results for input(s): CHOL, HDL, LDLCALC, TRIG, CHOLHDL, LDLDIRECT in the last 72 hours. Thyroid function studies No results for input(s): TSH, T4TOTAL, T3FREE, THYROIDAB in the last 72 hours.  Invalid input(s): FREET3 Anemia work up No results for input(s): VITAMINB12, FOLATE, FERRITIN, TIBC, IRON, RETICCTPCT in the last 72 hours. Urinalysis    Component Value Date/Time   COLORURINE YELLOW 11/14/2020 1429   APPEARANCEUR CLEAR 11/14/2020 1429   LABSPEC 1.038 (H) 11/14/2020 1429   PHURINE 8.0 11/14/2020 1429   GLUCOSEU NEGATIVE 11/14/2020 1429   HGBUR NEGATIVE 11/14/2020 1429   BILIRUBINUR NEGATIVE 11/14/2020 1429   KETONESUR NEGATIVE 11/14/2020 1429   PROTEINUR NEGATIVE 11/14/2020 1429   NITRITE NEGATIVE 11/14/2020 1429   LEUKOCYTESUR  NEGATIVE 11/14/2020 1429    Microbiology Recent Results (from the past 240 hour(s))  Culture, blood (routine x 2)     Status: None (Preliminary result)   Collection Time: 11/14/20  2:46 PM   Specimen: BLOOD  Result Value Ref Range Status   Specimen Description   Final    BLOOD RIGHT ANTECUBITAL Performed at [facility], [address], [place], [place] [address]    Special Requests   Final    BOTTLES DRAWN AEROBIC AND ANAEROBIC Blood Culture adequate volume Performed at [facility], [address], [place], [place] [address]    Culture   Final    NO GROWTH 2 DAYS Performed at [facility], [address] [place], [place] [address]    Report Status PENDING  Incomplete  Resp Panel by RT-PCR (Flu A&B, Covid) Urine, Clean Catch     Status: None   Collection Time: 11/14/20  2:49 PM   Specimen: Urine, Clean Catch; Nasopharyngeal(NP) swabs in vial transport medium  Result Value Ref Range Status   SARS Coronavirus 2 by RT PCR NEGATIVE NEGATIVE Final    Comment: (NOTE) SARS-CoV-2 target nucleic acids are NOT DETECTED.  The SARS-CoV-2 RNA is generally detectable in upper respiratory specimens during the acute phase of infection. The lowest concentration of SARS-CoV-2 viral copies this assay can detect is 138 copies/mL. A negative result does not preclude SARS-Cov-2 infection and should not be used as the sole basis for treatment or other patient management decisions. A negative result may occur with  improper specimen collection/handling, submission of specimen other than nasopharyngeal swab, presence of viral mutation(s) within the areas targeted by this assay, and inadequate number of viral copies(<138 copies/mL). A negative result must be combined with clinical observations, patient history, and epidemiological information. The expected result is Negative.  Fact Sheet for Patients:  BloggerCourse.com  Fact  Sheet for Healthcare Providers:  SeriousBroker.it  This test is no t yet approved or cleared by the [place] FDA and  has been authorized for detection and/or diagnosis of SARS-CoV-2  by FDA under an Emergency Use Authorization (EUA). This EUA will remain  in effect (meaning this test can be used) for the duration of the COVID-19 declaration under Section 564(b)(1) of the Act, 21 U.S.C.section 360bbb-3(b)(1), unless the authorization is terminated  or revoked sooner.       Influenza A by PCR NEGATIVE NEGATIVE Final   Influenza B by PCR NEGATIVE NEGATIVE Final    Comment: (NOTE) The Xpert Xpress SARS-CoV-2/FLU/RSV plus assay is intended as an aid in the diagnosis of influenza from Nasopharyngeal swab specimens and should not be used as a sole basis for treatment. Nasal washings and aspirates are unacceptable for Xpert Xpress SARS-CoV-2/FLU/RSV testing.  Fact Sheet for Patients: BloggerCourse.com  Fact Sheet for Healthcare Providers: SeriousBroker.it  This test is not yet approved or cleared by the [place] FDA and has been authorized for detection and/or diagnosis of SARS-CoV-2 by FDA under an Emergency Use Authorization (EUA). This EUA will remain in effect (meaning this test can be used) for the duration of the COVID-19 declaration under Section 564(b)(1) of the Act, 21 U.S.C. section 360bbb-3(b)(1), unless the authorization is terminated or revoked.  Performed at [facility], [address] [place], [place] [address]   Culture, blood (routine x 2)     Status: None (Preliminary result)   Collection Time: 11/14/20  3:40 PM   Specimen: BLOOD RIGHT HAND  Result Value Ref Range Status   Specimen Description   Final    BLOOD RIGHT HAND Performed at [facility], [address], [place], [place] [address]    Special Requests   Final    BOTTLES DRAWN  AEROBIC AND ANAEROBIC Blood Culture adequate volume Performed at [facility], [address], [place], [place] [address]    Culture   Final    NO GROWTH 2 DAYS Performed at [facility], [address], [place], [place] [address]    Report Status PENDING  Incomplete    Time coordinating discharge: Approximately 40 minutes  Tyrone Nine, MD  [facility] 11/16/2020, 9:06 AM

## 2020-11-17 ENCOUNTER — Telehealth: Payer: Self-pay

## 2020-11-17 MED ORDER — METOPROLOL TARTRATE 25 MG PO TABS: 25.0000 mg | ORAL_TABLET | Freq: Two times a day (BID) | ORAL | 0 refills | Status: DC

## 2020-11-17 NOTE — Telephone Encounter (Signed)
Rx sent for 30 day supply. He must keep upcoming OV for refill.

## 2020-11-17 NOTE — Telephone Encounter (Signed)
Transition Care Management Follow-up Telephone Call Date of discharge and from where: 11/16/2020, [facility]  How have you been since you were released from the hospital? He said he is " not feeling too good." He said his stomach has not been tolerating food lately but he is trying to eat today.  He has not taken all of his medications. He said that he needs to pick up the pantoprazole today.  Any questions or concerns? Yes  Items Reviewed: Did the pt receive and understand the discharge instructions provided? Yes  Medications obtained and verified? Medication list reviewed with patient. In addition to picking up the pantoprazole he said that he needs to pick up the carafate but it will not be ready until tomorrow. He needs a refill of metoprolol tartrate.  Informed him that Ms Meredeth Ide, NP will be notified. He didn't have any other questions about the med regime.  Other? No  Any new allergies since your discharge? No  Do you have support at home? Yes  - he has a roommate   Home Care and Equipment/Supplies: Were home health services ordered? no If so, what is the name of the agency? N/a  Has the agency set up a time to come to the patient's home? not applicable Were any new equipment or medical supplies ordered?  No What is the name of the medical supply agency? N/a Were you able to get the supplies/equipment? not applicable Do you have any questions related to the use of the equipment or supplies? No  He already has a nebulizer  Functional Questionnaire: (I = Independent and D = Dependent) ADLs: independent   Follow up appointments reviewed:  PCP Hospital f/u appt confirmed? Yes  Scheduled to see Bertram Denver, NP on 11/20/2020 @ 1050. Specialist Hospital f/u appt confirmed?  Needs to schedule follow up with GI   Are transportation arrangements needed? No  - he plans to take the bus.  Informed him that he should check with his insurance company because they may provide  transportation to medical appointments  If their condition worsens, is the pt aware to call PCP or go to the Emergency Dept.? Yes Was the patient provided with contact information for the PCP's office or ED? Yes Was to pt encouraged to call back with questions or concerns? Yes

## 2020-11-18 LAB — SURGICAL PATHOLOGY

## 2020-11-18 NOTE — Telephone Encounter (Signed)
Called to patient and informed him that an order for metoprolol was sent to his pharmacy and he said that he picked it up this morning.

## 2020-11-19 LAB — CULTURE, BLOOD (ROUTINE X 2)
Culture: NO GROWTH
Culture: NO GROWTH
Special Requests: ADEQUATE
Special Requests: ADEQUATE

## 2020-11-20 ENCOUNTER — Encounter: Payer: Self-pay | Admitting: Nurse Practitioner

## 2020-11-20 ENCOUNTER — Ambulatory Visit: Payer: Medicaid Other | Attending: Nurse Practitioner | Admitting: Nurse Practitioner

## 2020-11-20 ENCOUNTER — Other Ambulatory Visit: Payer: Self-pay

## 2020-11-20 VITALS — BP 102/62 | HR 76 | Ht 62.0 in | Wt 114.4 lb

## 2020-11-20 DIAGNOSIS — Z1211 Encounter for screening for malignant neoplasm of colon: Secondary | ICD-10-CM | POA: Diagnosis not present

## 2020-11-20 DIAGNOSIS — Z09 Encounter for follow-up examination after completed treatment for conditions other than malignant neoplasm: Secondary | ICD-10-CM

## 2020-11-20 DIAGNOSIS — K2100 Gastro-esophageal reflux disease with esophagitis, without bleeding: Secondary | ICD-10-CM | POA: Insufficient documentation

## 2020-11-20 DIAGNOSIS — K920 Hematemesis: Secondary | ICD-10-CM | POA: Insufficient documentation

## 2020-11-20 DIAGNOSIS — Z79899 Other long term (current) drug therapy: Secondary | ICD-10-CM | POA: Insufficient documentation

## 2020-11-20 DIAGNOSIS — E876 Hypokalemia: Secondary | ICD-10-CM

## 2020-11-20 DIAGNOSIS — D62 Acute posthemorrhagic anemia: Secondary | ICD-10-CM | POA: Diagnosis not present

## 2020-11-20 DIAGNOSIS — F5101 Primary insomnia: Secondary | ICD-10-CM

## 2020-11-20 DIAGNOSIS — Z7982 Long term (current) use of aspirin: Secondary | ICD-10-CM | POA: Diagnosis not present

## 2020-11-20 DIAGNOSIS — I10 Essential (primary) hypertension: Secondary | ICD-10-CM | POA: Insufficient documentation

## 2020-11-20 MED ORDER — TRAZODONE HCL 100 MG PO TABS: 50.0000 mg | ORAL_TABLET | Freq: Every day | ORAL | 3 refills | Status: DC

## 2020-11-20 NOTE — Progress Notes (Signed)
States that abdomen still hurts when he eats. Not sleeping at night.

## 2020-11-20 NOTE — Progress Notes (Signed)
Assessment & Plan:  Justin Poole was seen today for hospitalization follow-up.  Diagnoses and all orders for this visit:  Hospital discharge follow-up  Primary insomnia -     traZODone (DESYREL) 100 MG tablet; Take 0.5-1 tablets (50-100 mg total) by mouth at bedtime.  Colon cancer screening -     Fecal occult blood, imunochemical([facility])   Patient has been counseled on age-appropriate routine health concerns for screening and prevention. These are reviewed and up-to-date. Referrals have been placed accordingly. Immunizations are up-to-date or declined.    Subjective:   Chief Complaint  Patient presents with   Hospitalization Follow-up   HPI Justin Poole 55 y.o. male presents to office today for hospital follow up. He has a past medical history of AAA, Asthma, Dysrhythmia, GERD, ETOH Abuse Hypertension, Left bundle branch block, and Pancreatitis.    HFU Admitted from 7-9 through 11-16-2020. DC DX: GIB with hematemesis. EGD: confirmed esophagitis and showed gastritis which was biopsied. No active or recent bleeding noted. DC PLAN: Continue PPI BID x3 months, then daily for 6 months at least. Continue carafate for 4 weeks.  Will need to recheck H/H in a few weeks.  Today he states although symptoms have improved he still experiences abdominal pain when drinking cold liquids or eating certain temperature foods. Denies hematemesis, nausea or vomiting. Has f/u with GI in a few weeks.    Insomnia Has difficulty staying asleep at night. Only gets a few hours of sleep at at time.    Review of Systems  Constitutional:  Negative for fever, malaise/fatigue and weight loss.  HENT: Negative.  Negative for nosebleeds.   Eyes: Negative.  Negative for blurred vision, double vision and photophobia.  Respiratory: Negative.  Negative for cough and shortness of breath.   Cardiovascular: Negative.  Negative for chest pain, palpitations and leg swelling.  Gastrointestinal:  Positive  for abdominal pain. Negative for heartburn, nausea and vomiting.  Musculoskeletal: Negative.  Negative for myalgias.  Neurological: Negative.  Negative for dizziness, focal weakness, seizures and headaches.  Psychiatric/Behavioral:  Negative for suicidal ideas. The patient has insomnia.    Past Medical History:  Diagnosis Date   AAA (abdominal aortic aneurysm) (HCC)    Asthma    as a child   Dysrhythmia    pt states they have told him he "has an irregular heart beat"   GERD (gastroesophageal reflux disease)    Hypertension    Left bundle branch block    Pancreatitis    hospitalization 06/2018 for acute pancreatitis, likely relatead to alcohol use    Past Surgical History:  Procedure Laterality Date   AORTA - BILATERAL FEMORAL ARTERY BYPASS GRAFT N/A 05/22/2020   Procedure: OPEN ABDOMINAL AORTIC ANEURYSM REPAIR WITH HEMASHIELD GRAFT;  Surgeon: Leonie Douglas, MD;  Location: [facility] OR;  Service: Vascular;  Laterality: N/A;   BIOPSY  11/15/2020   Procedure: BIOPSY;  Surgeon: Kathi Der, MD;  Location: [facility] ENDOSCOPY;  Service: Gastroenterology;;   ESOPHAGOGASTRODUODENOSCOPY (EGD) WITH PROPOFOL N/A 05/25/2020   Procedure: ESOPHAGOGASTRODUODENOSCOPY (EGD) WITH PROPOFOL;  Surgeon: Sherrilyn Rist, MD;  Location: [facility] ENDOSCOPY;  Service: Gastroenterology;  Laterality: N/A;   ESOPHAGOGASTRODUODENOSCOPY (EGD) WITH PROPOFOL N/A 11/15/2020   Procedure: ESOPHAGOGASTRODUODENOSCOPY (EGD) WITH PROPOFOL;  Surgeon: Kathi Der, MD;  Location: [facility] ENDOSCOPY;  Service: Gastroenterology;  Laterality: N/A;   HAND SURGERY Left     Family History  Problem Relation Age of Onset   CAD Mother        Died of MI  60s   Heart attack Mother    CAD Father        Died of MI 73s   Heart attack Father    Diabetes Mellitus II Neg Hx     Social History Reviewed with no changes to be made today.   Outpatient Medications Prior to Visit  Medication Sig Dispense Refill   amLODipine (NORVASC) 5 MG tablet  Take 1 tablet by mouth once daily 90 tablet 2   aspirin EC 81 MG EC tablet Take 1 tablet (81 mg total) by mouth daily at 6 (six) AM. Swallow whole. 30 tablet 11   Ca Carbonate-Mag Hydroxide (ROLAIDS PO) Take 1 tablet by mouth daily as needed (acid reflux).     levalbuterol (XOPENEX) 0.63 MG/3ML nebulizer solution Take 3 mLs (0.63 mg total) by nebulization every 6 (six) hours as needed for wheezing or shortness of breath. 3 mL 12   metoprolol tartrate (LOPRESSOR) 25 MG tablet Take 1 tablet (25 mg total) by mouth 2 (two) times daily. 60 tablet 0   montelukast (SINGULAIR) 10 MG tablet Take 1 tablet (10 mg total) by mouth at bedtime.     pantoprazole (PROTONIX) 40 MG tablet Take 1 tablet (40 mg total) by mouth 2 (two) times daily. 60 tablet 2   sucralfate (CARAFATE) 1 g tablet Take 1 tablet (1 g total) by mouth 4 (four) times daily -  with meals and at bedtime for 28 days. 112 tablet 0   rosuvastatin (CRESTOR) 40 MG tablet Take 1 tablet (40 mg total) by mouth daily. 90 tablet 3   No facility-administered medications prior to visit.    No Known Allergies     Objective:    BP 102/62   Pulse 76   Ht 5\' 2"  (1.575 m)   Wt 114 lb 6.4 oz (51.9 kg)   SpO2 98%   BMI 20.92 kg/m  Wt Readings from Last 3 Encounters:  11/20/20 114 lb 6.4 oz (51.9 kg)  11/14/20 120 lb (54.4 kg)  06/12/20 109 lb (49.4 kg)    Physical Exam Vitals and nursing note reviewed.  Constitutional:      Appearance: He is well-developed.  HENT:     Head: Normocephalic and atraumatic.  Cardiovascular:     Rate and Rhythm: Normal rate and regular rhythm.     Heart sounds: Normal heart sounds. No murmur heard.   No friction rub. No gallop.  Pulmonary:     Effort: Pulmonary effort is normal. No tachypnea or respiratory distress.     Breath sounds: Normal breath sounds. No decreased breath sounds, wheezing, rhonchi or rales.  Chest:     Chest wall: No tenderness.  Abdominal:     General: Bowel sounds are normal.      Palpations: Abdomen is soft.  Musculoskeletal:        General: Normal range of motion.     Cervical back: Normal range of motion.  Skin:    General: Skin is warm and dry.  Neurological:     Mental Status: He is alert and oriented to person, place, and time.     Coordination: Coordination normal.  Psychiatric:        Behavior: Behavior normal. Behavior is cooperative.        Thought Content: Thought content normal.        Judgment: Judgment normal.         Patient has been counseled extensively about nutrition and exercise as well as the importance of adherence with medications  and regular follow-up. The patient was given clear instructions to go to ER or return to medical center if symptoms don't improve, worsen or new problems develop. The patient verbalized understanding.   Follow-up: Return in about 3 months (around 02/20/2021) for return for labs in 2 weeks. See me in 3 months.   Claiborne Rigg, FNP-BC [facility] [place], [place] [phone]   11/20/2020, 1:01 PM

## 2020-12-04 ENCOUNTER — Other Ambulatory Visit: Payer: Medicaid Other

## 2020-12-07 ENCOUNTER — Other Ambulatory Visit: Payer: Self-pay

## 2020-12-07 ENCOUNTER — Ambulatory Visit: Payer: Medicaid Other | Attending: Nurse Practitioner

## 2020-12-07 DIAGNOSIS — D62 Acute posthemorrhagic anemia: Secondary | ICD-10-CM

## 2020-12-07 DIAGNOSIS — E876 Hypokalemia: Secondary | ICD-10-CM

## 2020-12-08 LAB — CMP14+EGFR
ALT: 11 IU/L (ref 0–44)
AST: 18 IU/L (ref 0–40)
Albumin/Globulin Ratio: 1.8 (ref 1.2–2.2)
Albumin: 4.2 g/dL (ref 3.8–4.9)
Alkaline Phosphatase: 132 IU/L — ABNORMAL HIGH (ref 44–121)
BUN/Creatinine Ratio: 16 (ref 9–20)
BUN: 12 mg/dL (ref 6–24)
Bilirubin Total: <0.2 mg/dL (ref 0.0–1.2)
CO2: 24 mmol/L (ref 20–29)
Calcium: 9.8 mg/dL (ref 8.7–10.2)
Chloride: 98 mmol/L (ref 96–106)
Creatinine, Ser: 0.75 mg/dL — ABNORMAL LOW (ref 0.76–1.27)
Globulin, Total: 2.4 g/dL (ref 1.5–4.5)
Glucose: 87 mg/dL (ref 65–99)
Potassium: 4.2 mmol/L (ref 3.5–5.2)
Sodium: 136 mmol/L (ref 134–144)
Total Protein: 6.6 g/dL (ref 6.0–8.5)
eGFR: 105 mL/min/{1.73_m2} (ref >59)

## 2020-12-08 LAB — CBC
Hematocrit: 34.4 % — ABNORMAL LOW (ref 37.5–51.0)
Hemoglobin: 10.5 g/dL — ABNORMAL LOW (ref 13.0–17.7)
MCH: 26.6 pg (ref 26.6–33.0)
MCHC: 30.5 g/dL — ABNORMAL LOW (ref 31.5–35.7)
MCV: 87 fL (ref 79–97)
Platelets: 405 10*3/uL (ref 150–450)
RBC: 3.94 x10E6/uL — ABNORMAL LOW (ref 4.14–5.80)
RDW: 15.6 % — ABNORMAL HIGH (ref 11.6–15.4)
WBC: 8.2 10*3/uL (ref 3.4–10.8)

## 2021-01-05 ENCOUNTER — Other Ambulatory Visit: Payer: Self-pay | Admitting: Family Medicine

## 2021-01-05 NOTE — Telephone Encounter (Signed)
Requested medication (s) are due for refill today:   Yes  Requested medication (s) are on the active medication list:   Yes  Future visit scheduled:   Yes with Bertram Denver   Last ordered: 11/17/2020 #60, 0 refills  Returned because 30 day courtesy supply given 7/12.   Provider to review for refill until appt on 02/19/2021.   Requested Prescriptions  Pending Prescriptions Disp Refills   metoprolol tartrate (LOPRESSOR) 25 MG tablet [Pharmacy Med Name: Metoprolol Tartrate 25 MG Oral Tablet] 60 tablet 0    Sig: Take 1 tablet by mouth twice daily     Cardiovascular:  Beta Blockers Passed - 01/05/2021 12:08 PM      Passed - Last BP in normal range    BP Readings from Last 1 Encounters:  11/20/20 102/62          Passed - Last Heart Rate in normal range    Pulse Readings from Last 1 Encounters:  11/20/20 76          Passed - Valid encounter within last 6 months    Recent Outpatient Visits           1 month ago Hospital discharge follow-up   [facility] [place], Shea Stakes, NP   6 months ago Encounter to establish care   [facility] [place], Shea Stakes, NP       Future Appointments             In 1 month Claiborne Rigg, NP [facility]

## 2021-02-19 ENCOUNTER — Ambulatory Visit: Payer: Medicaid Other | Admitting: Nurse Practitioner

## 2021-03-11 ENCOUNTER — Other Ambulatory Visit: Payer: Self-pay | Admitting: Family Medicine

## 2021-03-12 NOTE — Telephone Encounter (Signed)
Requested medications are due for refill today yes  Requested medications are on the active medication list yes  Last refill 01/07/21  Last visit Seen 7/15 but HTN not addressed, last addressed 06/12/20  Future visit scheduled NO, NO SHOW 02/19/21 no upcoming visit scheduled.  Notes to clinic Failed protocol due to no valid visit within 6  months, no upcoming appt, please assess.  Requested Prescriptions  Pending Prescriptions Disp Refills   metoprolol tartrate (LOPRESSOR) 25 MG tablet [Pharmacy Med Name: Metoprolol Tartrate 25 MG Oral Tablet] 60 tablet 0    Sig: Take 1 tablet by mouth twice daily     Cardiovascular:  Beta Blockers Passed - 03/11/2021  3:36 PM      Passed - Last BP in normal range    BP Readings from Last 1 Encounters:  11/20/20 102/62          Passed - Last Heart Rate in normal range    Pulse Readings from Last 1 Encounters:  11/20/20 76          Passed - Valid encounter within last 6 months    Recent Outpatient Visits           3 months ago Hospital discharge follow-up   [facility] [place], Shea Stakes, NP   9 months ago Encounter to establish care   [facility] [place], Shea Stakes, NP

## 2021-04-19 NOTE — Progress Notes (Signed)
ASSESSMENT & PLAN:  Justin Poole is a 55 y.o. male status post open aortic repair with 94mm dacron tube graft for 96mm infrarenal abdominal aortic aneurysm on 05/22/20.  Recommend: Complete cessation from all tobacco products. Excellent blood glucose control with goal A1c < 7%. Blood pressure control with goal blood pressure < 140/90 mmHg. Lipid reduction therapy with goal LDL-C <100 mg/dL. Aspirin 81mg  PO QD.  Atorvastatin 40-80mg  PO QD (or other "high intensity" statin therapy).  Follow-up in 5 years with a CT angiogram of the chest, abdomen, and pelvis.  CHIEF COMPLAINT:   Abdominal aortic aneurysm  HISTORY:  HISTORY OF PRESENT ILLNESS: Justin Poole is a 55 y.o. male who initially presented to [facility], ER for evaluation of musculoskeletal left back pain.  He had a documented history of abdominal aortic aneurysm.  CT a in the emergency department revealed a 5.9 cm infrarenal abdominal aortic aneurysm. He reports his back pain is improved. He reports excellent performance status. He denies exertional angina or dyspnea. He can climb a flight of stairs without difficulty.   06/09/20: patient returns status post open aortic repair with 18mm dacron tube graft for 78mm infrarenal abdominal aortic aneurysm on 05/22/20. His immediate postoperative course was complicated by mucous plugging requiring bronchoscopy and upper GI bleeding secondary to severe esophagitis. He otherwise did well and was discharged 05/29/20. He returns to clinic in good spirits today. He reports his energy level is slowly increasing. He has had no problem with breathing or bleeding since his hospitalization. He is slowly increasing his activity.   04/20/21: Patient returns for noninvasive testing.  Because of a large wait time between his ABI and my appointment with him, he preferred to conduct a visit over the telephone.  The patient reports no problems.  He reports no difficulty with the incision.  He specifically  denies any bulging or other worrisome signs of hernia.  Counseled him about the rationale for surveillance of the entire aorta every 5 years.  Past Medical History:  Diagnosis Date   AAA (abdominal aortic aneurysm) (HCC)    Asthma    as a child   Dysrhythmia    pt states they have told him he "has an irregular heart beat"   GERD (gastroesophageal reflux disease)    Hypertension    Left bundle branch block    Pancreatitis    hospitalization 06/2018 for acute pancreatitis, likely relatead to alcohol use    Past Surgical History:  Procedure Laterality Date   AORTA - BILATERAL FEMORAL ARTERY BYPASS GRAFT N/A 05/22/2020   Procedure: OPEN ABDOMINAL AORTIC ANEURYSM REPAIR WITH HEMASHIELD GRAFT;  Surgeon: 05/24/2020, MD;  Location: [facility] OR;  Service: Vascular;  Laterality: N/A;   BIOPSY  11/15/2020   Procedure: BIOPSY;  Surgeon: 01/16/2021, MD;  Location: [facility] ENDOSCOPY;  Service: Gastroenterology;;   ESOPHAGOGASTRODUODENOSCOPY (EGD) WITH PROPOFOL N/A 05/25/2020   Procedure: ESOPHAGOGASTRODUODENOSCOPY (EGD) WITH PROPOFOL;  Surgeon: 05/27/2020, MD;  Location: [facility] ENDOSCOPY;  Service: Gastroenterology;  Laterality: N/A;   ESOPHAGOGASTRODUODENOSCOPY (EGD) WITH PROPOFOL N/A 11/15/2020   Procedure: ESOPHAGOGASTRODUODENOSCOPY (EGD) WITH PROPOFOL;  Surgeon: 01/16/2021, MD;  Location: [facility] ENDOSCOPY;  Service: Gastroenterology;  Laterality: N/A;   HAND SURGERY Left     Family History  Problem Relation Age of Onset   CAD Mother        Died of MI 72s   Heart attack Mother    CAD Father  Died of MI 56s   Heart attack Father    Diabetes Mellitus II Neg Hx     Social History   Socioeconomic History   Marital status: Single    Spouse name: Not on file   Number of children: Not on file   Years of education: Not on file   Highest education level: Not on file  Occupational History   Not on file  Tobacco Use   Smoking status: Every Day    Packs/day: 0.50    Years:  45.00    Pack years: 22.50    Types: Cigarettes   Smokeless tobacco: Never  Vaping Use   Vaping Use: Never used  Substance and Sexual Activity   Alcohol use: Yes    Comment: 1/2 pint a day    Drug use: Not Currently   Sexual activity: Not Currently  Other Topics Concern   Not on file  Social History Narrative   Not on file   Social Determinants of Health   Financial Resource Strain: Not on file  Food Insecurity: Not on file  Transportation Needs: Not on file  Physical Activity: Not on file  Stress: Not on file  Social Connections: Not on file  Intimate Partner Violence: Not on file    No Known Allergies  Current Outpatient Medications  Medication Sig Dispense Refill   amLODipine (NORVASC) 5 MG tablet Take 1 tablet by mouth once daily 90 tablet 2   aspirin EC 81 MG EC tablet Take 1 tablet (81 mg total) by mouth daily at 6 (six) AM. Swallow whole. 30 tablet 11   Ca Carbonate-Mag Hydroxide (ROLAIDS PO) Take 1 tablet by mouth daily as needed (acid reflux).     levalbuterol (XOPENEX) 0.63 MG/3ML nebulizer solution Take 3 mLs (0.63 mg total) by nebulization every 6 (six) hours as needed for wheezing or shortness of breath. 3 mL 12   metoprolol tartrate (LOPRESSOR) 25 MG tablet Take 1 tablet by mouth twice daily 60 tablet 0   montelukast (SINGULAIR) 10 MG tablet Take 1 tablet (10 mg total) by mouth at bedtime.     pantoprazole (PROTONIX) 40 MG tablet Take 1 tablet (40 mg total) by mouth 2 (two) times daily. 60 tablet 2   rosuvastatin (CRESTOR) 40 MG tablet Take 1 tablet (40 mg total) by mouth daily. 90 tablet 3   sucralfate (CARAFATE) 1 g tablet Take 1 tablet (1 g total) by mouth 4 (four) times daily -  with meals and at bedtime for 28 days. 112 tablet 0   traZODone (DESYREL) 100 MG tablet Take 0.5-1 tablets (50-100 mg total) by mouth at bedtime. 30 tablet 3   No current facility-administered medications for this visit.    REVIEW OF SYSTEMS:  [X]  denotes positive finding, [ ]   denotes negative finding Cardiac  Comments:  Chest pain or chest pressure:    Shortness of breath upon exertion:    Short of breath when lying flat:    Irregular heart rhythm:        Vascular    Pain in calf, thigh, or hip brought on by ambulation:    Pain in feet at night that wakes you up from your sleep:     Blood clot in your veins:    Leg swelling:         Pulmonary    Oxygen at home:    Productive cough:     Wheezing:         Neurologic    Sudden  weakness in arms or legs:     Sudden numbness in arms or legs:     Sudden onset of difficulty speaking or slurred speech:    Temporary loss of vision in one eye:     Problems with dizziness:         Gastrointestinal    Blood in stool:     Vomited blood:         Genitourinary    Burning when urinating:     Blood in urine:        Psychiatric    Major depression:         Hematologic    Bleeding problems:    Problems with blood clotting too easily:        Skin    Rashes or ulcers:        Constitutional    Fever or chills:     PHYSICAL EXAM:   No physical exam performed because of telephone visit.  DATA REVIEW:    Most recent CBC CBC Latest Ref Rng & Units 12/07/2020 11/16/2020 11/15/2020  WBC 3.4 - 10.8 x10E3/uL 8.2 8.7 -  Hemoglobin 13.0 - 17.7 g/dL 10.5(L) 9.3(L) 9.8(L)  Hematocrit 37.5 - 51.0 % 34.4(L) 29.2(L) 31.0(L)  Platelets 150 - 450 x10E3/uL 405 167 -     Most recent CMP CMP Latest Ref Rng & Units 12/07/2020 11/15/2020 11/14/2020  Glucose 65 - 99 mg/dL 87 151(H) -  BUN 6 - 24 mg/dL 12 10 -  Creatinine 0.76 - 1.27 mg/dL 0.75(L) 0.76 0.90  Sodium 134 - 144 mmol/L 136 140 -  Potassium 3.5 - 5.2 mmol/L 4.2 3.2(L) -  Chloride 96 - 106 mmol/L 98 99 -  CO2 20 - 29 mmol/L 24 33(H) -  Calcium 8.7 - 10.2 mg/dL 9.8 8.3(L) -  Total Protein 6.0 - 8.5 g/dL 6.6 - -  Total Bilirubin 0.0 - 1.2 mg/dL <0.2 - -  Alkaline Phos 44 - 121 IU/L 132(H) - -  AST 0 - 40 IU/L 18 - -  ALT 0 - 44 IU/L 11 - -    Renal  function CrCl cannot be calculated (Patient's most recent lab result is older than the maximum 21 days allowed.).  Hgb A1c MFr Bld (%)  Date Value  06/30/2020 5.3    LDL Chol Calc (NIH)  Date Value Ref Range Status  04/09/2020 101 (H) 0 - 99 mg/dL Final     +-------+-----------+-----------+------------+------------+  ABI/TBIToday's ABIToday's TBIPrevious ABIPrevious TBI  +-------+-----------+-----------+------------+------------+  Right  1.29       0.75                                 +-------+-----------+-----------+------------+------------+  Left   1.26       0.85                                 +-------+-----------+-----------+------------+------------+   Yevonne Aline. Stanford Breed, MD [facility] Phone Number: [phone] 04/19/2021 6:37 PM

## 2021-04-20 ENCOUNTER — Ambulatory Visit: Payer: Medicaid Other | Admitting: Vascular Surgery

## 2021-04-20 ENCOUNTER — Other Ambulatory Visit: Payer: Self-pay

## 2021-04-20 ENCOUNTER — Ambulatory Visit (HOSPITAL_COMMUNITY)
Admission: RE | Admit: 2021-04-20 | Discharge: 2021-04-20 | Disposition: A | Discharge status: Home / Self Care | Payer: Medicaid Other | Source: Ambulatory Visit | Attending: Vascular Surgery | Admitting: Vascular Surgery

## 2021-04-20 DIAGNOSIS — M7989 Other specified soft tissue disorders: Secondary | ICD-10-CM

## 2021-04-20 DIAGNOSIS — Z9889 Other specified postprocedural states: Secondary | ICD-10-CM

## 2021-04-20 DIAGNOSIS — I739 Peripheral vascular disease, unspecified: Secondary | ICD-10-CM | POA: Diagnosis present

## 2021-07-30 ENCOUNTER — Other Ambulatory Visit: Payer: Self-pay | Admitting: Cardiology

## 2021-07-30 ENCOUNTER — Other Ambulatory Visit: Payer: Self-pay | Admitting: Family Medicine

## 2021-07-30 DIAGNOSIS — E785 Hyperlipidemia, unspecified: Secondary | ICD-10-CM

## 2021-11-01 ENCOUNTER — Other Ambulatory Visit: Payer: Self-pay | Admitting: Cardiology

## 2021-11-01 DIAGNOSIS — E785 Hyperlipidemia, unspecified: Secondary | ICD-10-CM

## 2021-12-06 ENCOUNTER — Other Ambulatory Visit: Payer: Self-pay | Admitting: Cardiology

## 2021-12-06 DIAGNOSIS — E785 Hyperlipidemia, unspecified: Secondary | ICD-10-CM

## 2022-02-04 ENCOUNTER — Other Ambulatory Visit: Payer: Self-pay | Admitting: Cardiology

## 2022-02-04 DIAGNOSIS — E785 Hyperlipidemia, unspecified: Secondary | ICD-10-CM

## 2022-02-08 ENCOUNTER — Other Ambulatory Visit: Payer: Self-pay | Admitting: Cardiology

## 2022-03-15 ENCOUNTER — Other Ambulatory Visit: Payer: Self-pay

## 2022-03-16 IMAGING — CT CT CTA ABD/PEL W/CM AND/OR W/O CM
2 of 6 series · 13 of 46 positions shown, 15 images · IV contrast (omnipaque)
Comparison: 11/02/2018

CLINICAL DATA: 54-year-old male with history of infrarenal
abdominal aortic aneurysm. Surveillance study.

EXAM:
CT ANGIOGRAPHY ABDOMEN AND PELVIS WITH CONTRAST AND WITHOUT CONTRAST
TECHNIQUE: Multidetector CT imaging of the abdomen and pelvis was performed
using the standard protocol during bolus administration of
intravenous contrast. Multiplanar reconstructed images and MIPs were
obtained and reviewed to evaluate the vascular anatomy.
CONTRAST:  100mL OMNIPAQUE IOHEXOL 350 MG/ML SOLN

[Series 4: axial arterial · axial · arterial · 0.59mm/px · z∈[+932,+1322]mm · 10 of 157 slices shown, 12 images]
[im 14/157  soft-tissue]
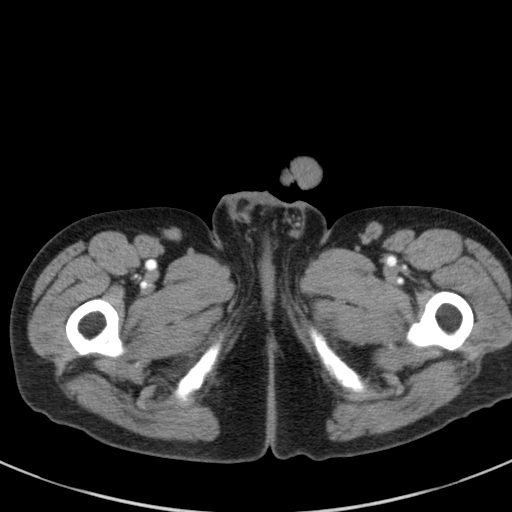
[im 14/157  bone]
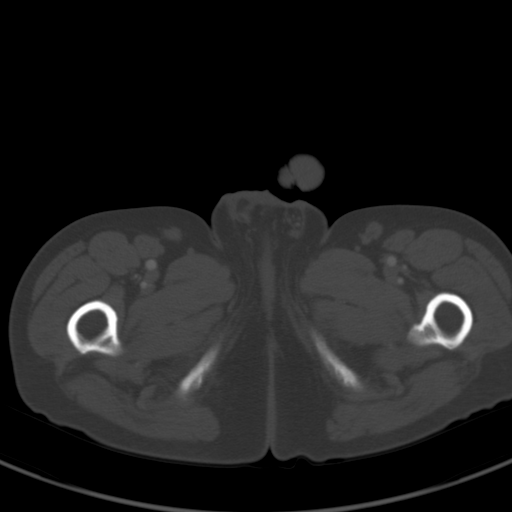
[im 27/157  soft-tissue]
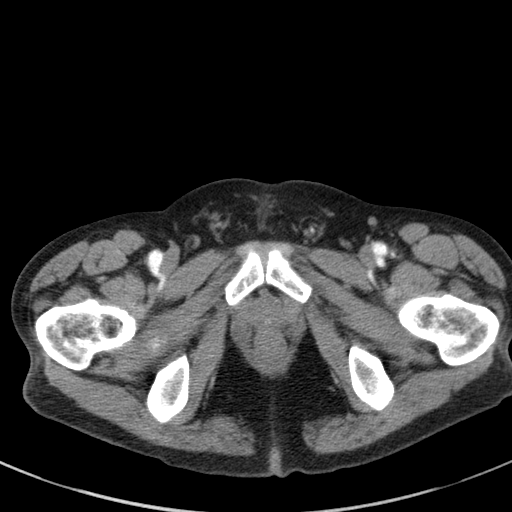
[im 40/157  soft-tissue]
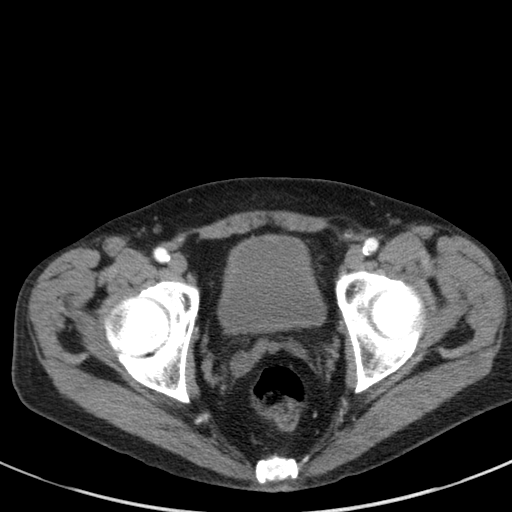
[im 59/157  soft-tissue]
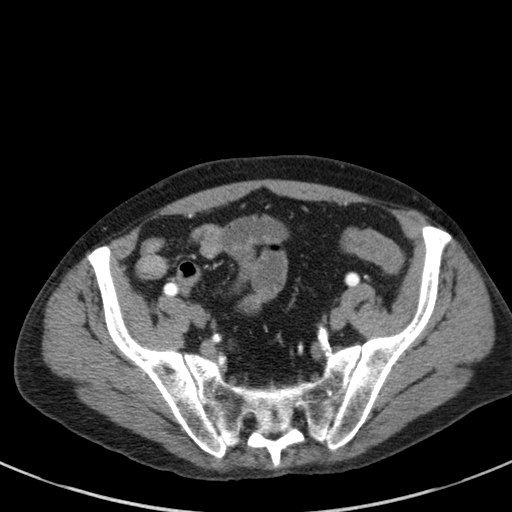
[im 72/157  soft-tissue]
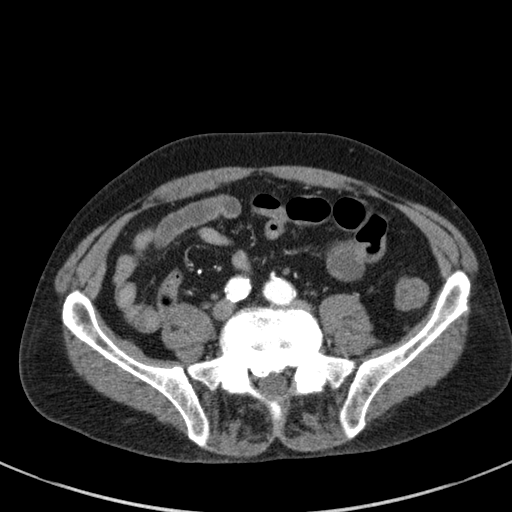
[im 85/157  soft-tissue]
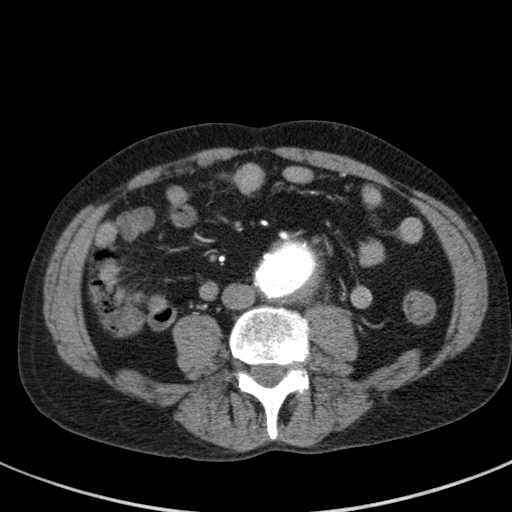
[im 98/157  soft-tissue]
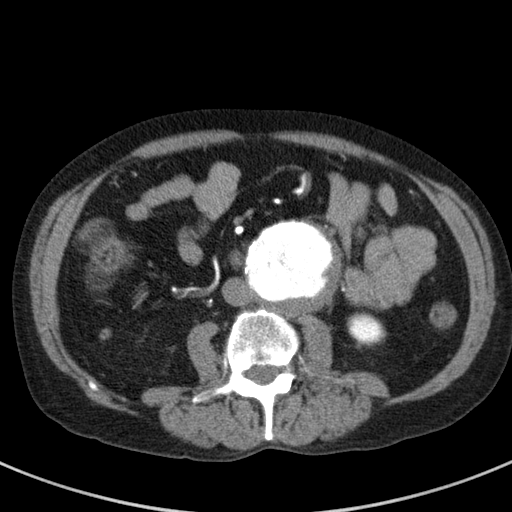
[im 118/157  soft-tissue]
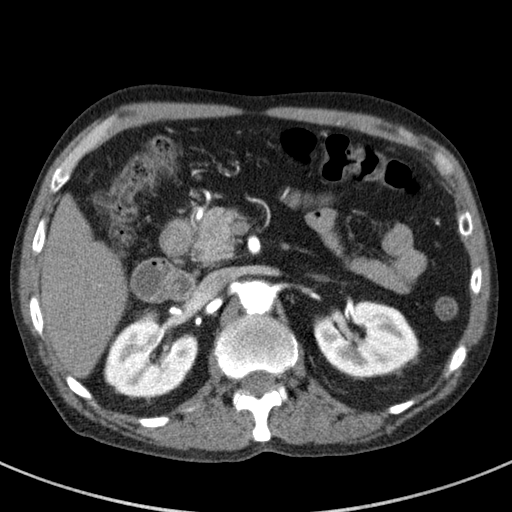
[im 131/157  soft-tissue]
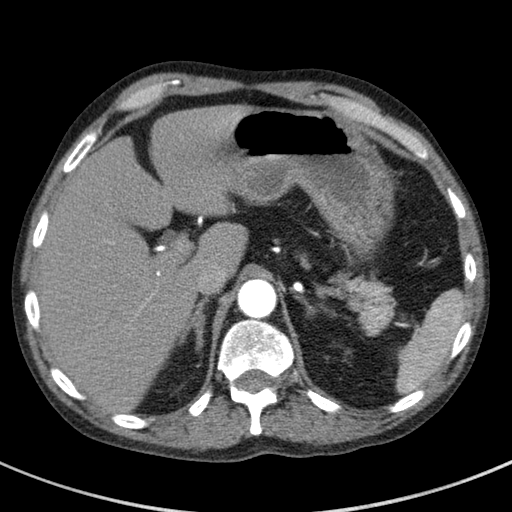
[im 131/157  bone]
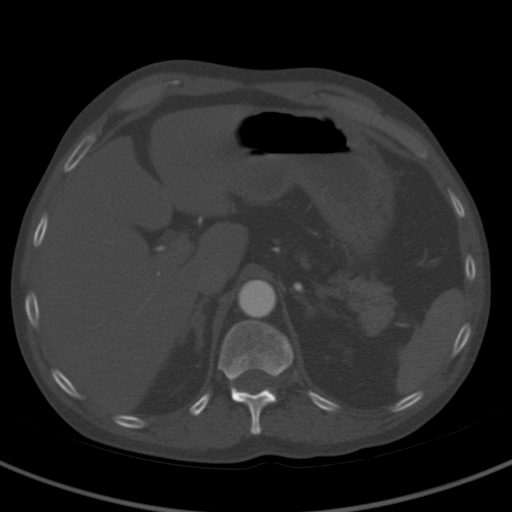
[im 144/157  soft-tissue]
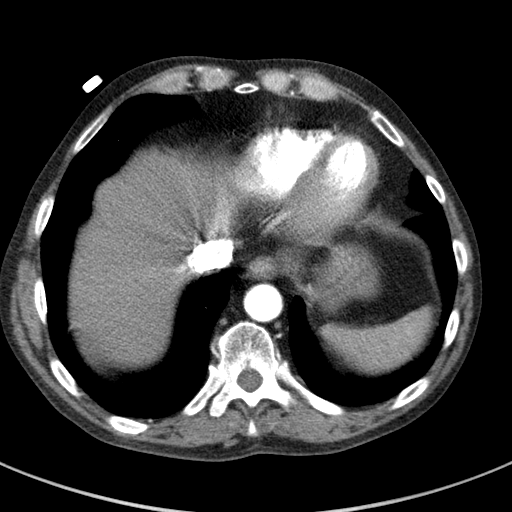

[Series 7: coronal · coronal · 0.62mm/px · 3 of 92 slices shown]
[im 23/92  soft-tissue]
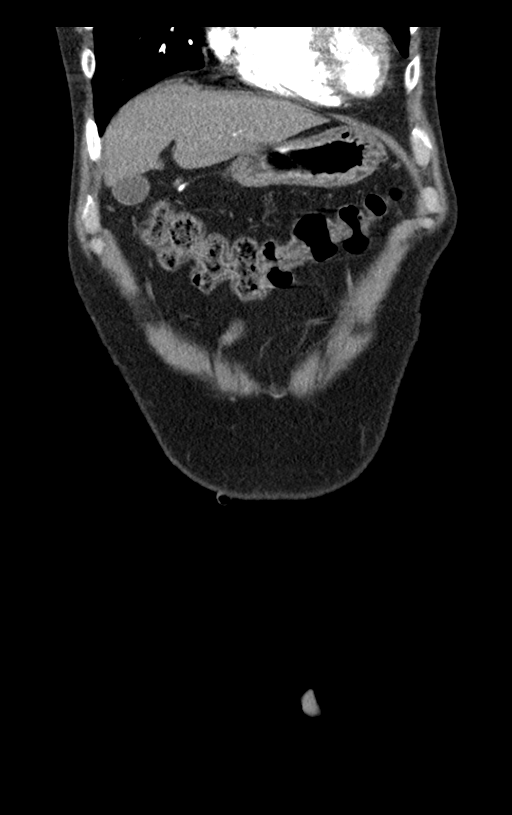
[im 46/92  soft-tissue]
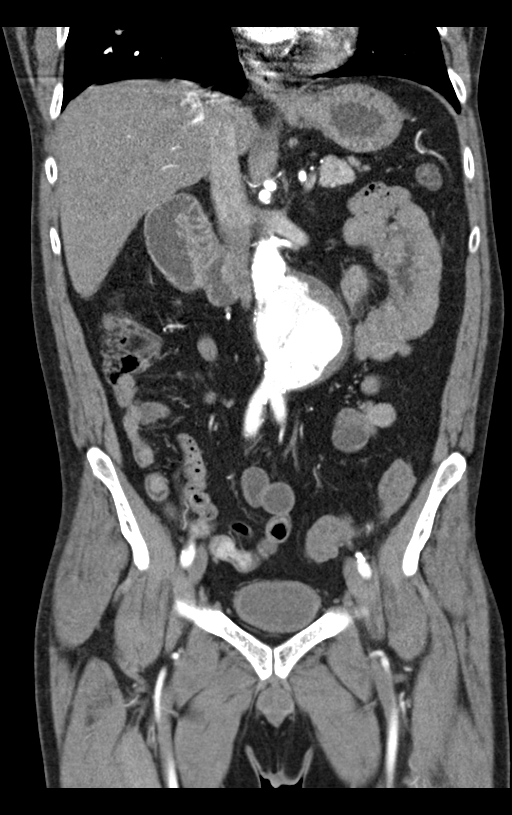
[im 69/92  soft-tissue]
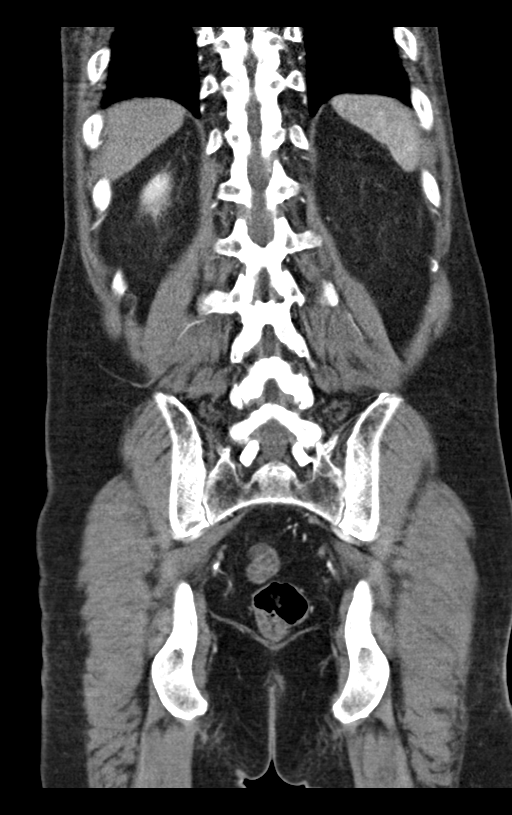

[13 of 46 positions shown; findings below may reference images not displayed]

FINDINGS: VASCULAR

Aorta: Similar appearing infrarenal abdominal aortic aneurysm
measuring 56 x 59 mm in maximum axial diameter which is increased
from 12/02/2018 at which time measured 51 x 54 mm. There is
persistent wall adherent mural thrombus within the aneurysm sac. No
evidence of surrounding inflammatory changes. Scattered
atherosclerotic calcifications throughout the abdominal aorta.

Celiac: Mild, focal proximal stenosis without significant associated
atherosclerotic changes. Patent distally.

SMA: Patent without evidence of aneurysm, dissection, vasculitis or
significant stenosis.

Renals: Single right renal artery is patent with mild ostial
stenosis secondary to atherosclerotic plaque. Dual left renal
arteries are patent with mild ostial stenosis of the inferior pole
branch secondary to atherosclerotic plaque. No evidence of aneurysm,
dissection, vasculitis, fibromuscular dysplasia or significant
stenosis.

IMA: Patent without evidence of aneurysm, dissection, vasculitis or
significant stenosis.

Inflow: Mild fusiform dilation of the mid to distal bilateral common
iliac arteries measuring up to 14 mm on the left and 13 mm on the
right. Prominent atherosclerotic calcification in fibrofatty plaque
about the bilateral common iliac arteries. The bilateral external
iliac arteries are widely patent with minimal scattered
atherosclerotic calcification.

Proximal Outflow: Bilateral common femoral and visualized portions
of the superficial and profunda femoral arteries are patent without
evidence of aneurysm, dissection, vasculitis or significant
stenosis.

Veins: No obvious venous abnormality within the limitations of this
arterial phase study.

Review of the MIP images confirms the above findings.

NON-VASCULAR

Lower chest: No acute abnormality.

Hepatobiliary: No focal liver abnormality is seen. No gallstones,
gallbladder wall thickening, or biliary dilatation.

Pancreas: Unremarkable. No pancreatic ductal dilatation or
surrounding inflammatory changes.

Spleen: Normal in size without focal abnormality.

Adrenals/Urinary Tract: Adrenal glands are unremarkable. Kidneys are
normal, without renal calculi, focal lesion, or hydronephrosis.
Bladder is unremarkable.

Stomach/Bowel: Stomach is within normal limits. The retrocolic
appendix appears normal. No evidence of bowel wall thickening,
distention, or inflammatory changes.

Lymphatic: No abdominopelvic lymphadenopathy.

Reproductive: Prostate is unremarkable.

Other: No abdominal wall hernia or abnormality. No abdominopelvic
ascites.

Musculoskeletal: No acute or significant osseous findings.
IMPRESSION: VASCULAR

1. Continued enlargement of infrarenal abdominal aortic aneurysm now
measuring up to 59 mm, previously 54 mm. Recommend referral to a
vascular specialist. This recommendation follows ACR consensus
guidelines: White Paper of the ACR Incidental Findings Committee II
on Vascular Findings. [HOSPITAL] 2089; [DATE].
2. Mild focal ostial stenosis of the proximal celiac trunk as could
be seen with median arcuate ligament compression.
3.  Aortic Atherosclerosis ([id number]-[id number]).

NON-VASCULAR

No acute or significant abdominopelvic abnormality.

## 2022-03-21 ENCOUNTER — Encounter (HOSPITAL_COMMUNITY): Payer: Self-pay | Admitting: Emergency Medicine

## 2022-03-21 ENCOUNTER — Ambulatory Visit (HOSPITAL_COMMUNITY)
Admission: EM | Admit: 2022-03-21 | Discharge: 2022-03-22 | Disposition: A | Discharge status: Home / Self Care | Payer: 59 | Attending: Psychiatry | Admitting: Psychiatry

## 2022-03-21 DIAGNOSIS — R4589 Other symptoms and signs involving emotional state: Secondary | ICD-10-CM | POA: Diagnosis not present

## 2022-03-21 DIAGNOSIS — F1290 Cannabis use, unspecified, uncomplicated: Secondary | ICD-10-CM | POA: Insufficient documentation

## 2022-03-21 DIAGNOSIS — R4689 Other symptoms and signs involving appearance and behavior: Secondary | ICD-10-CM | POA: Diagnosis not present

## 2022-03-21 DIAGNOSIS — F1010 Alcohol abuse, uncomplicated: Secondary | ICD-10-CM | POA: Diagnosis present

## 2022-03-21 DIAGNOSIS — F1914 Other psychoactive substance abuse with psychoactive substance-induced mood disorder: Secondary | ICD-10-CM | POA: Insufficient documentation

## 2022-03-21 DIAGNOSIS — Z1152 Encounter for screening for COVID-19: Secondary | ICD-10-CM | POA: Diagnosis not present

## 2022-03-21 DIAGNOSIS — F17210 Nicotine dependence, cigarettes, uncomplicated: Secondary | ICD-10-CM | POA: Insufficient documentation

## 2022-03-21 DIAGNOSIS — F1910 Other psychoactive substance abuse, uncomplicated: Secondary | ICD-10-CM | POA: Diagnosis present

## 2022-03-21 DIAGNOSIS — F1994 Other psychoactive substance use, unspecified with psychoactive substance-induced mood disorder: Secondary | ICD-10-CM | POA: Diagnosis not present

## 2022-03-21 DIAGNOSIS — F911 Conduct disorder, childhood-onset type: Secondary | ICD-10-CM | POA: Insufficient documentation

## 2022-03-21 LAB — POCT URINE DRUG SCREEN - MANUAL ENTRY (I-SCREEN)
POC Amphetamine UR: NOT DETECTED
POC Buprenorphine (BUP): NOT DETECTED
POC Cocaine UR: POSITIVE — AB
POC Marijuana UR: POSITIVE — AB
POC Methadone UR: NOT DETECTED
POC Methamphetamine UR: NOT DETECTED
POC Morphine: NOT DETECTED
POC Oxazepam (BZO): NOT DETECTED
POC Oxycodone UR: NOT DETECTED
POC Secobarbital (BAR): NOT DETECTED

## 2022-03-21 LAB — LIPID PANEL
Cholesterol: 229 mg/dL — ABNORMAL HIGH (ref 0–200)
HDL: 78 mg/dL (ref >40)
LDL Cholesterol: 105 mg/dL — ABNORMAL HIGH (ref 0–99)
Total CHOL/HDL Ratio: 2.9 RATIO
Triglycerides: 228 mg/dL — ABNORMAL HIGH (ref <150)
VLDL: 46 mg/dL — ABNORMAL HIGH (ref 0–40)

## 2022-03-21 LAB — ETHANOL: Alcohol, Ethyl (B): 224 mg/dL — ABNORMAL HIGH (ref <10)

## 2022-03-21 LAB — COMPREHENSIVE METABOLIC PANEL
ALT: 16 U/L (ref 0–44)
AST: 23 U/L (ref 15–41)
Albumin: 4.8 g/dL (ref 3.5–5.0)
Alkaline Phosphatase: 105 U/L (ref 38–126)
Anion gap: 11 (ref 5–15)
BUN: 11 mg/dL (ref 6–20)
CO2: 27 mmol/L (ref 22–32)
Calcium: 10.3 mg/dL (ref 8.9–10.3)
Chloride: 105 mmol/L (ref 98–111)
Creatinine, Ser: 0.94 mg/dL (ref 0.61–1.24)
GFR, Estimated: >60 mL/min (ref >60)
Glucose, Bld: 104 mg/dL — ABNORMAL HIGH (ref 70–99)
Potassium: 4.8 mmol/L (ref 3.5–5.1)
Sodium: 143 mmol/L (ref 135–145)
Total Bilirubin: 0.4 mg/dL (ref 0.3–1.2)
Total Protein: 8.1 g/dL (ref 6.5–8.1)

## 2022-03-21 LAB — CBC WITH DIFFERENTIAL/PLATELET
Abs Immature Granulocytes: 0.03 10*3/uL (ref 0.00–0.07)
Basophils Absolute: 0.1 10*3/uL (ref 0.0–0.1)
Basophils Relative: 1 %
Eosinophils Absolute: 0.3 10*3/uL (ref 0.0–0.5)
Eosinophils Relative: 4 %
HCT: 42.5 % (ref 39.0–52.0)
Hemoglobin: 13.6 g/dL (ref 13.0–17.0)
Immature Granulocytes: 0 %
Lymphocytes Relative: 32 %
Lymphs Abs: 2.6 10*3/uL (ref 0.7–4.0)
MCH: 25.3 pg — ABNORMAL LOW (ref 26.0–34.0)
MCHC: 32 g/dL (ref 30.0–36.0)
MCV: 79 fL — ABNORMAL LOW (ref 80.0–100.0)
Monocytes Absolute: 0.6 10*3/uL (ref 0.1–1.0)
Monocytes Relative: 7 %
Neutro Abs: 4.6 10*3/uL (ref 1.7–7.7)
Neutrophils Relative %: 56 %
Platelets: 260 10*3/uL (ref 150–400)
RBC: 5.38 MIL/uL (ref 4.22–5.81)
RDW: 20.8 % — ABNORMAL HIGH (ref 11.5–15.5)
WBC: 8.2 10*3/uL (ref 4.0–10.5)
nRBC: 0 % (ref 0.0–0.2)

## 2022-03-21 LAB — POC SARS CORONAVIRUS 2 AG: SARSCOV2ONAVIRUS 2 AG: NEGATIVE

## 2022-03-21 LAB — RESP PANEL BY RT-PCR (FLU A&B, COVID) ARPGX2
Influenza A by PCR: NEGATIVE
Influenza B by PCR: NEGATIVE
SARS Coronavirus 2 by RT PCR: NEGATIVE

## 2022-03-21 LAB — TSH: TSH: 2.319 u[IU]/mL (ref 0.350–4.500)

## 2022-03-21 LAB — HEMOGLOBIN A1C
Hgb A1c MFr Bld: 5.5 % (ref 4.8–5.6)
Mean Plasma Glucose: 111.15 mg/dL

## 2022-03-21 MED ORDER — ZIPRASIDONE MESYLATE 20 MG IM SOLR: 20.0000 mg | INTRAMUSCULAR | Status: DC | PRN

## 2022-03-21 MED ORDER — LEVALBUTEROL TARTRATE 45 MCG/ACT IN AERO
2.0000 | INHALATION_SPRAY | Freq: Four times a day (QID) | RESPIRATORY_TRACT | Status: DC
  Filled 2022-03-21: qty 15

## 2022-03-21 MED ORDER — THIAMINE HCL 100 MG/ML IJ SOLN
100.0000 mg | Freq: Once | INTRAMUSCULAR | Status: AC
  Administered 2022-03-21: 100 mg via INTRAMUSCULAR
  Filled 2022-03-21: qty 2

## 2022-03-21 MED ORDER — PANTOPRAZOLE SODIUM 40 MG PO TBEC
40.0000 mg | DELAYED_RELEASE_TABLET | Freq: Two times a day (BID) | ORAL | Status: DC
  Administered 2022-03-21 – 2022-03-22 (×2): 40 mg via ORAL
  Filled 2022-03-21 (×2): qty 1

## 2022-03-21 MED ORDER — ALUM & MAG HYDROXIDE-SIMETH 200-200-20 MG/5ML PO SUSP: 30.0000 mL | ORAL | Status: DC | PRN

## 2022-03-21 MED ORDER — AMLODIPINE BESYLATE 5 MG PO TABS
5.0000 mg | ORAL_TABLET | Freq: Every day | ORAL | Status: DC
  Administered 2022-03-21 – 2022-03-22 (×2): 5 mg via ORAL
  Filled 2022-03-21 (×2): qty 1

## 2022-03-21 MED ORDER — ONDANSETRON 4 MG PO TBDP: 4.0000 mg | ORAL_TABLET | Freq: Four times a day (QID) | ORAL | Status: DC | PRN

## 2022-03-21 MED ORDER — HYDROXYZINE HCL 25 MG PO TABS: 25.0000 mg | ORAL_TABLET | Freq: Four times a day (QID) | ORAL | Status: DC | PRN

## 2022-03-21 MED ORDER — THIAMINE MONONITRATE 100 MG PO TABS
100.0000 mg | ORAL_TABLET | Freq: Every day | ORAL | Status: DC
  Administered 2022-03-22: 100 mg via ORAL
  Filled 2022-03-21: qty 1

## 2022-03-21 MED ORDER — ROSUVASTATIN CALCIUM 20 MG PO TABS
40.0000 mg | ORAL_TABLET | Freq: Every day | ORAL | Status: DC
  Administered 2022-03-21 – 2022-03-22 (×2): 40 mg via ORAL
  Filled 2022-03-21 (×2): qty 2

## 2022-03-21 MED ORDER — MAGNESIUM HYDROXIDE 400 MG/5ML PO SUSP: 30.0000 mL | Freq: Every day | ORAL | Status: DC | PRN

## 2022-03-21 MED ORDER — MONTELUKAST SODIUM 10 MG PO TABS
10.0000 mg | ORAL_TABLET | Freq: Every day | ORAL | Status: DC
  Administered 2022-03-21: 10 mg via ORAL
  Filled 2022-03-21: qty 1

## 2022-03-21 MED ORDER — ADULT MULTIVITAMIN W/MINERALS CH
1.0000 | ORAL_TABLET | Freq: Every day | ORAL | Status: DC
  Administered 2022-03-21 – 2022-03-22 (×2): 1 via ORAL
  Filled 2022-03-21 (×2): qty 1

## 2022-03-21 MED ORDER — METOPROLOL TARTRATE 25 MG PO TABS
25.0000 mg | ORAL_TABLET | Freq: Two times a day (BID) | ORAL | Status: DC
  Administered 2022-03-21 – 2022-03-22 (×2): 25 mg via ORAL
  Filled 2022-03-21 (×2): qty 1

## 2022-03-21 MED ORDER — OLANZAPINE 10 MG PO TBDP: 10.0000 mg | ORAL_TABLET | Freq: Three times a day (TID) | ORAL | Status: DC | PRN

## 2022-03-21 MED ORDER — LORAZEPAM 1 MG PO TABS: 1.0000 mg | ORAL_TABLET | ORAL | Status: DC | PRN

## 2022-03-21 MED ORDER — ASPIRIN 81 MG PO TBEC
81.0000 mg | DELAYED_RELEASE_TABLET | Freq: Every day | ORAL | Status: DC
  Administered 2022-03-22: 81 mg via ORAL
  Filled 2022-03-21: qty 1

## 2022-03-21 MED ORDER — ACETAMINOPHEN 325 MG PO TABS: 650.0000 mg | ORAL_TABLET | Freq: Four times a day (QID) | ORAL | Status: DC | PRN

## 2022-03-21 MED ORDER — ALBUTEROL SULFATE (2.5 MG/3ML) 0.083% IN NEBU
2.5000 mg | INHALATION_SOLUTION | Freq: Four times a day (QID) | RESPIRATORY_TRACT | Status: DC
  Filled 2022-03-21 (×2): qty 3

## 2022-03-21 MED ORDER — LORAZEPAM 1 MG PO TABS: 1.0000 mg | ORAL_TABLET | Freq: Four times a day (QID) | ORAL | Status: DC | PRN

## 2022-03-21 MED ORDER — LOPERAMIDE HCL 2 MG PO CAPS: 2.0000 mg | ORAL_CAPSULE | ORAL | Status: DC | PRN
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
## 2022-03-21 NOTE — ED Notes (Signed)
Pt sleeping at present, no distress noted.  Monitoring for safety. 

## 2022-03-22 ENCOUNTER — Encounter (HOSPITAL_COMMUNITY): Payer: Self-pay | Admitting: Registered Nurse

## 2022-03-22 DIAGNOSIS — F1910 Other psychoactive substance abuse, uncomplicated: Secondary | ICD-10-CM | POA: Diagnosis present

## 2022-03-22 DIAGNOSIS — F1994 Other psychoactive substance use, unspecified with psychoactive substance-induced mood disorder: Secondary | ICD-10-CM | POA: Diagnosis present

## 2022-03-22 DIAGNOSIS — F1914 Other psychoactive substance abuse with psychoactive substance-induced mood disorder: Secondary | ICD-10-CM | POA: Diagnosis not present

## 2022-03-22 MED ORDER — LEVALBUTEROL TARTRATE 45 MCG/ACT IN AERO
2.0000 | INHALATION_SPRAY | Freq: Four times a day (QID) | RESPIRATORY_TRACT | Status: DC | PRN
  Filled 2022-03-22: qty 15

## 2022-03-22 NOTE — ED Notes (Signed)
[place]o eating lunch interacting with peers. Safety maintained.

## 2022-03-22 NOTE — ED Notes (Signed)
Pt sleeping at present, no distress noted.  Monitoring for safety. 

## 2022-03-22 NOTE — ED Notes (Signed)
Patient A&O x 4, ambulatory. Patient discharged in no acute distress. Patient denied SI/HI, A/VH upon discharge. Patient verbalized understanding of all discharge instructions explained by staff, to include follow up appointments and safety plan. Pt belongings returned to patient from locker #11 intact. Patient escorted to lobby via staff with bus pass in hand. Safety maintained.
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
## 2022-03-22 NOTE — Discharge Instructions (Addendum)
Greetings, Mr. Justin Poole!  Base on the information you have provided and the presenting issue, outpatient services with therapy and psychiatry have been recommended.  It is imperative that you follow through with treatment recommendations within 7-10 days from the of discharge to mitigate further risk to your safety and mental well-being. A list of referrals has been provided below to get you started.  You are not limited to the list provided.  In case of an urgent crisis, you may contact the Mobile Crisis Unit with [facility] at [phone].  Along with other providers listed above in the Follow-up Providers section, here is an additional list of providers that will be helpful to you.  It is beneficial to our mental health to have a support system regardless if it is from friends, family, or professionals.  Finding someone who you feel comfortable talking with is important for any shifts from unhelpful thinking patterns begin.    Outpatient Therapy and Psychiatry for Medicare Recipients  [facility] [address] [place], [place], [address] [phone] phone  [facility] [address] [place], [place], [address] [address] phone ([address] [employer] [address] - [place], [address], [place], [place], [employer], [address], [employer] Healthy [place], [place], [address], [place], [place], [place], Optum, Tricare, UHC, [employer], [place])  [facility] [address] [place], [place], [address] [phone] phone  [facility] [address] [place], [place], [address] [phone] phone  [facility] [address] [place], [place], [address] [phone] phone [phone] fax  [facility] [place] [address], [place], [address] [phone] phone  [facility] [address] [place], [place], [address] [phone] phone [phone] fax  [facility] [address] [place], [place], [address] [phone] phone  [facility]uss[facility] [address] [place], [place], [address] [phone] phone  [facility] [address] [place], [place], [address] [phone] phone [phone] fax
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
## 2022-03-22 NOTE — ED Notes (Signed)
Patient A&Ox4. Denies intent to harm self/others when asked. Denies A/VH. Patient denies any physical complaints when asked. No acute distress noted. Routine safety checks conducted according to facility protocol. Encouraged patient to notify staff if thoughts of harm toward self or others arise. Patient verbalize understanding and agreement. Will continue to monitor for safety.    

## 2022-03-22 NOTE — ED Notes (Signed)
Pt sleeping in no acute distress. RR even and unlabored. Safety maintained. 

## 2022-07-11 ENCOUNTER — Other Ambulatory Visit: Payer: Self-pay | Admitting: Cardiology

## 2022-10-27 DIAGNOSIS — I700 Atherosclerosis of aorta: Secondary | ICD-10-CM | POA: Diagnosis not present

## 2022-10-27 DIAGNOSIS — I110 Hypertensive heart disease with heart failure: Secondary | ICD-10-CM | POA: Diagnosis not present

## 2022-10-27 DIAGNOSIS — R7303 Prediabetes: Secondary | ICD-10-CM | POA: Diagnosis not present

## 2022-10-27 DIAGNOSIS — I509 Heart failure, unspecified: Secondary | ICD-10-CM | POA: Diagnosis not present

## 2022-10-27 DIAGNOSIS — E785 Hyperlipidemia, unspecified: Secondary | ICD-10-CM | POA: Diagnosis not present

## 2022-10-27 DIAGNOSIS — Z136 Encounter for screening for cardiovascular disorders: Secondary | ICD-10-CM | POA: Diagnosis not present

## 2022-10-27 DIAGNOSIS — I25810 Atherosclerosis of coronary artery bypass graft(s) without angina pectoris: Secondary | ICD-10-CM | POA: Diagnosis not present

## 2022-10-27 DIAGNOSIS — K219 Gastro-esophageal reflux disease without esophagitis: Secondary | ICD-10-CM | POA: Diagnosis not present

## 2022-10-27 DIAGNOSIS — F17210 Nicotine dependence, cigarettes, uncomplicated: Secondary | ICD-10-CM | POA: Diagnosis not present

## 2022-10-27 DIAGNOSIS — Z79899 Other long term (current) drug therapy: Secondary | ICD-10-CM | POA: Diagnosis not present

## 2022-11-03 DIAGNOSIS — Z0001 Encounter for general adult medical examination with abnormal findings: Secondary | ICD-10-CM | POA: Diagnosis not present

## 2022-11-03 DIAGNOSIS — I110 Hypertensive heart disease with heart failure: Secondary | ICD-10-CM | POA: Diagnosis not present

## 2022-11-03 DIAGNOSIS — I25708 Atherosclerosis of coronary artery bypass graft(s), unspecified, with other forms of angina pectoris: Secondary | ICD-10-CM | POA: Diagnosis not present

## 2022-11-03 DIAGNOSIS — I25810 Atherosclerosis of coronary artery bypass graft(s) without angina pectoris: Secondary | ICD-10-CM | POA: Diagnosis not present

## 2022-11-03 DIAGNOSIS — I700 Atherosclerosis of aorta: Secondary | ICD-10-CM | POA: Diagnosis not present

## 2022-11-03 DIAGNOSIS — I509 Heart failure, unspecified: Secondary | ICD-10-CM | POA: Diagnosis not present

## 2022-11-03 DIAGNOSIS — K219 Gastro-esophageal reflux disease without esophagitis: Secondary | ICD-10-CM | POA: Diagnosis not present

## 2022-11-03 DIAGNOSIS — F17210 Nicotine dependence, cigarettes, uncomplicated: Secondary | ICD-10-CM | POA: Diagnosis not present

## 2023-02-08 ENCOUNTER — Other Ambulatory Visit: Payer: Self-pay | Admitting: Cardiology

## 2023-02-08 DIAGNOSIS — E785 Hyperlipidemia, unspecified: Secondary | ICD-10-CM

## 2023-02-17 ENCOUNTER — Other Ambulatory Visit: Payer: Self-pay | Admitting: Cardiology

## 2023-02-17 DIAGNOSIS — E785 Hyperlipidemia, unspecified: Secondary | ICD-10-CM

## 2023-02-17 MED ORDER — ROSUVASTATIN CALCIUM 40 MG PO TABS: 40.0000 mg | ORAL_TABLET | Freq: Every day | ORAL | 0 refills | Status: AC
